# Patient Record
Sex: Female | Born: 1935 | Race: White | Hispanic: No | Smoking: Never smoker
Health system: Southern US, Community
[De-identification: ages and names within clinical notes are randomized; demographics above are authoritative.]

## PROBLEM LIST (undated history)

## (undated) DIAGNOSIS — F32A Depression, unspecified: Secondary | ICD-10-CM

## (undated) DIAGNOSIS — F329 Major depressive disorder, single episode, unspecified: Secondary | ICD-10-CM

## (undated) DIAGNOSIS — F419 Anxiety disorder, unspecified: Secondary | ICD-10-CM

## (undated) DIAGNOSIS — K219 Gastro-esophageal reflux disease without esophagitis: Secondary | ICD-10-CM

## (undated) DIAGNOSIS — E538 Deficiency of other specified B group vitamins: Secondary | ICD-10-CM

## (undated) DIAGNOSIS — C801 Malignant (primary) neoplasm, unspecified: Secondary | ICD-10-CM

## (undated) DIAGNOSIS — F29 Unspecified psychosis not due to a substance or known physiological condition: Secondary | ICD-10-CM

## (undated) DIAGNOSIS — M1990 Unspecified osteoarthritis, unspecified site: Secondary | ICD-10-CM

## (undated) DIAGNOSIS — M519 Unspecified thoracic, thoracolumbar and lumbosacral intervertebral disc disorder: Secondary | ICD-10-CM

## (undated) DIAGNOSIS — I10 Essential (primary) hypertension: Secondary | ICD-10-CM

## (undated) DIAGNOSIS — S62102A Fracture of unspecified carpal bone, left wrist, initial encounter for closed fracture: Secondary | ICD-10-CM

## (undated) DIAGNOSIS — K7581 Nonalcoholic steatohepatitis (NASH): Secondary | ICD-10-CM

## (undated) HISTORY — DX: Nonalcoholic steatohepatitis (NASH): K75.81

## (undated) HISTORY — PX: JOINT REPLACEMENT: SHX530

## (undated) HISTORY — PX: ELBOW SURGERY: SHX618

## (undated) HISTORY — DX: Gastro-esophageal reflux disease without esophagitis: K21.9

## (undated) HISTORY — DX: Depression, unspecified: F32.A

## (undated) HISTORY — DX: Unspecified thoracic, thoracolumbar and lumbosacral intervertebral disc disorder: M51.9

## (undated) HISTORY — DX: Major depressive disorder, single episode, unspecified: F32.9

## (undated) HISTORY — DX: Essential (primary) hypertension: I10

## (undated) HISTORY — DX: Deficiency of other specified B group vitamins: E53.8

## (undated) HISTORY — DX: Unspecified osteoarthritis, unspecified site: M19.90

## (undated) HISTORY — PX: OTHER SURGICAL HISTORY: SHX169

## (undated) HISTORY — DX: Anxiety disorder, unspecified: F41.9

## (undated) HISTORY — PX: BREAST SURGERY: SHX581

## (undated) HISTORY — PX: CHOLECYSTECTOMY: SHX55

## (undated) HISTORY — DX: Fracture of unspecified carpal bone, left wrist, initial encounter for closed fracture: S62.102A

## (undated) HISTORY — DX: Unspecified psychosis not due to a substance or known physiological condition: F29

## (undated) HISTORY — PX: MANDIBLE SURGERY: SHX707

## (undated) HISTORY — PX: ABDOMINAL HYSTERECTOMY: SHX81

## (undated) HISTORY — PX: CATARACT EXTRACTION W/ INTRAOCULAR LENS  IMPLANT, BILATERAL: SHX1307

## (undated) HISTORY — PX: HEMORRHOID SURGERY: SHX153

---

## 2005-04-24 ENCOUNTER — Ambulatory Visit: Payer: Self-pay | Admitting: Internal Medicine

## 2005-08-19 ENCOUNTER — Ambulatory Visit: Payer: Self-pay | Admitting: Urology

## 2005-11-05 ENCOUNTER — Ambulatory Visit: Payer: Self-pay | Admitting: Unknown Physician Specialty

## 2005-11-12 ENCOUNTER — Ambulatory Visit: Payer: Self-pay | Admitting: Unknown Physician Specialty

## 2005-12-28 ENCOUNTER — Ambulatory Visit: Payer: Self-pay | Admitting: Unknown Physician Specialty

## 2006-02-16 ENCOUNTER — Ambulatory Visit: Payer: Self-pay | Admitting: Internal Medicine

## 2006-02-23 ENCOUNTER — Ambulatory Visit: Payer: Self-pay | Admitting: Unknown Physician Specialty

## 2006-02-25 ENCOUNTER — Ambulatory Visit: Payer: Self-pay | Admitting: Internal Medicine

## 2006-04-29 ENCOUNTER — Ambulatory Visit: Payer: Self-pay | Admitting: Unknown Physician Specialty

## 2006-04-30 ENCOUNTER — Ambulatory Visit: Payer: Self-pay | Admitting: Unknown Physician Specialty

## 2006-04-30 ENCOUNTER — Other Ambulatory Visit: Payer: Self-pay

## 2006-05-07 ENCOUNTER — Ambulatory Visit: Payer: Self-pay | Admitting: Unknown Physician Specialty

## 2006-05-21 ENCOUNTER — Inpatient Hospital Stay: Payer: Self-pay | Admitting: Psychiatry

## 2006-05-21 ENCOUNTER — Other Ambulatory Visit: Payer: Self-pay

## 2006-07-08 ENCOUNTER — Ambulatory Visit: Payer: Self-pay | Admitting: Internal Medicine

## 2006-07-20 ENCOUNTER — Ambulatory Visit: Payer: Self-pay | Admitting: Internal Medicine

## 2006-08-08 ENCOUNTER — Ambulatory Visit: Payer: Self-pay | Admitting: Internal Medicine

## 2006-08-25 ENCOUNTER — Ambulatory Visit: Payer: Self-pay | Admitting: Internal Medicine

## 2006-09-07 ENCOUNTER — Ambulatory Visit: Payer: Self-pay | Admitting: Internal Medicine

## 2006-09-16 ENCOUNTER — Ambulatory Visit: Payer: Self-pay | Admitting: Unknown Physician Specialty

## 2006-12-24 ENCOUNTER — Emergency Department: Payer: Self-pay | Admitting: Emergency Medicine

## 2007-01-05 ENCOUNTER — Ambulatory Visit: Payer: Self-pay | Admitting: Unknown Physician Specialty

## 2007-01-08 ENCOUNTER — Ambulatory Visit: Payer: Self-pay | Admitting: Internal Medicine

## 2007-01-28 ENCOUNTER — Ambulatory Visit: Payer: Self-pay | Admitting: Internal Medicine

## 2007-02-07 ENCOUNTER — Ambulatory Visit: Payer: Self-pay | Admitting: Internal Medicine

## 2007-04-04 ENCOUNTER — Ambulatory Visit: Payer: Self-pay | Admitting: Unknown Physician Specialty

## 2007-04-04 ENCOUNTER — Other Ambulatory Visit: Payer: Self-pay

## 2007-04-07 ENCOUNTER — Inpatient Hospital Stay: Payer: Self-pay | Admitting: Unknown Physician Specialty

## 2007-05-17 ENCOUNTER — Ambulatory Visit: Payer: Self-pay | Admitting: Internal Medicine

## 2007-05-25 ENCOUNTER — Ambulatory Visit: Payer: Self-pay | Admitting: Internal Medicine

## 2007-11-08 ENCOUNTER — Ambulatory Visit: Payer: Self-pay | Admitting: Internal Medicine

## 2007-11-22 ENCOUNTER — Ambulatory Visit: Payer: Self-pay | Admitting: Otolaryngology

## 2007-11-28 ENCOUNTER — Ambulatory Visit: Payer: Self-pay | Admitting: Otolaryngology

## 2007-12-08 ENCOUNTER — Ambulatory Visit: Payer: Self-pay | Admitting: Internal Medicine

## 2008-01-08 ENCOUNTER — Ambulatory Visit: Payer: Self-pay | Admitting: Internal Medicine

## 2008-04-09 ENCOUNTER — Ambulatory Visit: Payer: Self-pay | Admitting: Internal Medicine

## 2008-04-10 ENCOUNTER — Ambulatory Visit: Payer: Self-pay | Admitting: Internal Medicine

## 2008-05-07 ENCOUNTER — Ambulatory Visit: Payer: Self-pay | Admitting: Internal Medicine

## 2008-05-17 ENCOUNTER — Ambulatory Visit: Payer: Self-pay | Admitting: Internal Medicine

## 2008-06-07 ENCOUNTER — Ambulatory Visit: Payer: Self-pay | Admitting: Internal Medicine

## 2008-06-12 ENCOUNTER — Ambulatory Visit: Payer: Self-pay | Admitting: Internal Medicine

## 2008-07-07 ENCOUNTER — Ambulatory Visit: Payer: Self-pay | Admitting: Internal Medicine

## 2008-08-07 ENCOUNTER — Ambulatory Visit: Payer: Self-pay | Admitting: Internal Medicine

## 2008-09-06 ENCOUNTER — Ambulatory Visit: Payer: Self-pay | Admitting: Internal Medicine

## 2008-09-24 ENCOUNTER — Ambulatory Visit: Payer: Self-pay | Admitting: Internal Medicine

## 2008-10-07 ENCOUNTER — Ambulatory Visit: Payer: Self-pay | Admitting: Internal Medicine

## 2008-11-07 ENCOUNTER — Ambulatory Visit: Payer: Self-pay | Admitting: Internal Medicine

## 2008-12-07 ENCOUNTER — Ambulatory Visit: Payer: Self-pay | Admitting: Internal Medicine

## 2009-02-06 ENCOUNTER — Ambulatory Visit: Payer: Self-pay | Admitting: Internal Medicine

## 2009-02-19 ENCOUNTER — Ambulatory Visit: Payer: Self-pay | Admitting: Internal Medicine

## 2009-03-09 ENCOUNTER — Ambulatory Visit: Payer: Self-pay | Admitting: Internal Medicine

## 2009-05-06 ENCOUNTER — Ambulatory Visit: Payer: Self-pay | Admitting: Ophthalmology

## 2009-05-14 ENCOUNTER — Ambulatory Visit: Payer: Self-pay | Admitting: Internal Medicine

## 2009-05-28 ENCOUNTER — Ambulatory Visit: Payer: Self-pay | Admitting: Internal Medicine

## 2009-06-07 ENCOUNTER — Ambulatory Visit: Payer: Self-pay | Admitting: Internal Medicine

## 2009-08-06 ENCOUNTER — Ambulatory Visit: Payer: Self-pay | Admitting: Internal Medicine

## 2009-08-07 ENCOUNTER — Ambulatory Visit: Payer: Self-pay | Admitting: Internal Medicine

## 2009-10-07 ENCOUNTER — Ambulatory Visit: Payer: Self-pay | Admitting: Internal Medicine

## 2009-10-29 ENCOUNTER — Ambulatory Visit: Payer: Self-pay | Admitting: Internal Medicine

## 2009-11-07 ENCOUNTER — Ambulatory Visit: Payer: Self-pay | Admitting: Internal Medicine

## 2009-11-15 ENCOUNTER — Ambulatory Visit: Payer: Self-pay | Admitting: Otolaryngology

## 2009-11-20 ENCOUNTER — Ambulatory Visit: Payer: Self-pay | Admitting: Otolaryngology

## 2009-12-07 ENCOUNTER — Ambulatory Visit: Payer: Self-pay | Admitting: Internal Medicine

## 2009-12-17 ENCOUNTER — Inpatient Hospital Stay: Payer: Self-pay | Admitting: Internal Medicine

## 2010-02-25 ENCOUNTER — Ambulatory Visit: Payer: Self-pay | Admitting: Internal Medicine

## 2010-03-09 ENCOUNTER — Ambulatory Visit: Payer: Self-pay | Admitting: Internal Medicine

## 2010-03-09 HISTORY — PX: FIXATION KYPHOPLASTY: SHX860

## 2010-03-13 ENCOUNTER — Inpatient Hospital Stay: Payer: Self-pay | Admitting: Internal Medicine

## 2010-03-20 LAB — PATHOLOGY REPORT

## 2010-03-21 ENCOUNTER — Encounter: Payer: Self-pay | Admitting: Internal Medicine

## 2010-03-25 DIAGNOSIS — M810 Age-related osteoporosis without current pathological fracture: Secondary | ICD-10-CM

## 2010-03-25 DIAGNOSIS — E119 Type 2 diabetes mellitus without complications: Secondary | ICD-10-CM

## 2010-03-25 DIAGNOSIS — I10 Essential (primary) hypertension: Secondary | ICD-10-CM

## 2010-03-25 DIAGNOSIS — R21 Rash and other nonspecific skin eruption: Secondary | ICD-10-CM

## 2010-04-10 NOTE — Letter (Signed)
Summary: [facility] Face [facility]  [facility] Face Sheet   Imported By: Beau Fanny 03/26/2010 10:06:38  _____________________________________________________________________  External Attachment:    Type:   Image     Comment:   External Document

## 2010-04-30 ENCOUNTER — Emergency Department: Payer: Self-pay | Admitting: Emergency Medicine

## 2010-05-02 DIAGNOSIS — E119 Type 2 diabetes mellitus without complications: Secondary | ICD-10-CM

## 2010-05-02 DIAGNOSIS — I10 Essential (primary) hypertension: Secondary | ICD-10-CM

## 2010-05-02 DIAGNOSIS — M810 Age-related osteoporosis without current pathological fracture: Secondary | ICD-10-CM

## 2010-05-07 ENCOUNTER — Ambulatory Visit: Payer: Self-pay | Admitting: Unknown Physician Specialty

## 2010-05-09 ENCOUNTER — Ambulatory Visit: Payer: Self-pay | Admitting: Unknown Physician Specialty

## 2010-05-19 DIAGNOSIS — F29 Unspecified psychosis not due to a substance or known physiological condition: Secondary | ICD-10-CM

## 2010-05-26 ENCOUNTER — Telehealth: Payer: Self-pay | Admitting: Internal Medicine

## 2010-06-03 DIAGNOSIS — L03119 Cellulitis of unspecified part of limb: Secondary | ICD-10-CM

## 2010-06-03 DIAGNOSIS — L02419 Cutaneous abscess of limb, unspecified: Secondary | ICD-10-CM

## 2010-06-05 NOTE — Progress Notes (Signed)
Summary: call a nurse   Phone Note Call from Patient   Summary of Call: Triage Record Num: [id number] Operator: Caswell Corwin Patient Name: Veronica Stafford Call Date & Time: 05/23/2010 6:33:45PM Patient Phone: [phone] PCP: Raynelle Highland Patient Gender: Female PCP Fax : [phone] Patient DOB: 08/28/1928 Practice Name: [facility] Reason for Call: Cammie Sickle LPN calling from [facility] @ [phone] that pt is psychotic, delusional, hallucinating and has had Haldol 0.25 mg and had Klonopin 0.5 mg at 1600. She thinks men are in the ceiling takig her pictures and they are going to torture her. Pt was moved to another room and now is eating dinner. 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 Triaged Phobia and inst needs to be seen in the E/R, but states pt will not go. Paged Dr. Loreen Freud and requested per above nurse to give Haldol 0.5 mg and Klonopin 0.5 mg at 2000. Recalled cell at 1704 adn message left. May have one or the other probably the Haldol 0.5 mg @ 2000 and then if still aggitated she has to go out. Recalled and order given. Protocol(s) Used: Phobias Recommended Outcome per Protocol: See ED Immediately Reason for Outcome: Hearing voices that no one else hears or seeing things that no one else sees Care Advice:  ~ Another adult should drive.  ~ Should not be alone, arrange for support (family member, friend, etc.). 05/23/2010 7:29:23PM Page 1 Initial call taken by: Melody Comas,  May 26, 2010 8:12 AM  Follow-up for Phone Call        Has had ongoing paranoia antipsychotics being titrated Follow-up by: Cindee Salt MD,  May 26, 2010 10:11 AM

## 2010-06-09 DIAGNOSIS — F200 Paranoid schizophrenia: Secondary | ICD-10-CM

## 2010-06-18 DIAGNOSIS — K7689 Other specified diseases of liver: Secondary | ICD-10-CM

## 2010-06-24 ENCOUNTER — Ambulatory Visit: Payer: Self-pay | Admitting: Internal Medicine

## 2010-07-07 DIAGNOSIS — D649 Anemia, unspecified: Secondary | ICD-10-CM

## 2010-07-07 DIAGNOSIS — I872 Venous insufficiency (chronic) (peripheral): Secondary | ICD-10-CM

## 2010-07-07 DIAGNOSIS — F29 Unspecified psychosis not due to a substance or known physiological condition: Secondary | ICD-10-CM

## 2010-07-07 DIAGNOSIS — M810 Age-related osteoporosis without current pathological fracture: Secondary | ICD-10-CM

## 2010-07-08 ENCOUNTER — Ambulatory Visit: Payer: Self-pay | Admitting: Internal Medicine

## 2010-07-12 ENCOUNTER — Encounter: Payer: Self-pay | Admitting: Internal Medicine

## 2010-07-31 ENCOUNTER — Encounter: Payer: Self-pay | Admitting: Internal Medicine

## 2010-07-31 ENCOUNTER — Non-Acute Institutional Stay: Payer: Medicare Other | Admitting: Internal Medicine

## 2010-07-31 VITALS — BP 120/58 | HR 70 | Temp 98.2°F | Resp 18 | Wt 203.0 lb

## 2010-07-31 DIAGNOSIS — D696 Thrombocytopenia, unspecified: Secondary | ICD-10-CM | POA: Insufficient documentation

## 2010-07-31 DIAGNOSIS — K7581 Nonalcoholic steatohepatitis (NASH): Secondary | ICD-10-CM | POA: Insufficient documentation

## 2010-07-31 DIAGNOSIS — K7689 Other specified diseases of liver: Secondary | ICD-10-CM

## 2010-07-31 DIAGNOSIS — K219 Gastro-esophageal reflux disease without esophagitis: Secondary | ICD-10-CM | POA: Insufficient documentation

## 2010-07-31 DIAGNOSIS — E119 Type 2 diabetes mellitus without complications: Secondary | ICD-10-CM

## 2010-07-31 DIAGNOSIS — I10 Essential (primary) hypertension: Secondary | ICD-10-CM | POA: Insufficient documentation

## 2010-07-31 DIAGNOSIS — D61818 Other pancytopenia: Secondary | ICD-10-CM

## 2010-07-31 DIAGNOSIS — E1149 Type 2 diabetes mellitus with other diabetic neurological complication: Secondary | ICD-10-CM | POA: Insufficient documentation

## 2010-07-31 DIAGNOSIS — F29 Unspecified psychosis not due to a substance or known physiological condition: Secondary | ICD-10-CM | POA: Insufficient documentation

## 2010-07-31 DIAGNOSIS — I872 Venous insufficiency (chronic) (peripheral): Secondary | ICD-10-CM

## 2010-07-31 DIAGNOSIS — E538 Deficiency of other specified B group vitamins: Secondary | ICD-10-CM | POA: Insufficient documentation

## 2010-07-31 DIAGNOSIS — M810 Age-related osteoporosis without current pathological fracture: Secondary | ICD-10-CM | POA: Insufficient documentation

## 2010-07-31 DIAGNOSIS — F419 Anxiety disorder, unspecified: Secondary | ICD-10-CM | POA: Insufficient documentation

## 2010-07-31 NOTE — Patient Instructions (Signed)
Will plan follow up in about 3 months 

## 2010-07-31 NOTE — Progress Notes (Signed)
Subjective:    Patient ID: Veronica Stafford, female    DOB: 06-26-1928, 77 y.o.   MRN: [id number]  HPI DOing well Has adjusted to assisted living "I love it here--I don't know why I didn't come here sooner"  Sugars daily 140-170 usually She is worried miralax has sugar in it Discussed that the risperdal could be increasing it  Recently saw Dr Sherrlyn Hock Hgb 8 Got iron infusion Does note some ongoing fatigue Some DOE---no sig change recently or at most slightly worse No chest pain  Carla notes no major psychosis Not really delusional since first moving in (felt there were things going on with the air conditioning ducts) Anxiety controlled  Chronic edema Still with burning and cramping Chronic redness  Current outpatient prescriptions:allopurinol (ZYLOPRIM) 300 MG tablet, Take 300 mg by mouth daily.  , Disp: , Rfl: ;  atenolol (TENORMIN) 25 MG tablet, Take 25 mg by mouth daily.  , Disp: , Rfl: ;  clonazePAM (KLONOPIN) 0.5 MG tablet, Take 0.5 mg by mouth 3 (three) times daily as needed.  , Disp: , Rfl: ;  cyanocobalamin (,VITAMIN B-12,) 1000 MCG/ML injection, Inject 1,000 mcg into the muscle every 30 (thirty) days.  , Disp: , Rfl:  ergocalciferol (VITAMIN D2) 50000 UNITS capsule, Take 50,000 Units by mouth every 30 (thirty) days.  , Disp: , Rfl: ;  FLUoxetine (PROZAC) 10 MG capsule, Take 30 mg by mouth daily.  , Disp: , Rfl: ;  furosemide (LASIX) 80 MG tablet, Take 80 mg by mouth daily.  , Disp: , Rfl: ;  gabapentin (NEURONTIN) 300 MG capsule, Take 300 mg by mouth 3 (three) times daily.  , Disp: , Rfl:  glipiZIDE (GLUCOTROL) 5 MG tablet, Take 5 mg by mouth daily.  , Disp: , Rfl: ;  Multiple Vitamins-Minerals (MULTIVITAMIN WITH MINERALS) tablet, Take 1 tablet by mouth daily.  , Disp: , Rfl: ;  polyethylene glycol (MIRALAX / GLYCOLAX) packet, Take 17 g by mouth daily.  , Disp: , Rfl: ;  ranitidine (ZANTAC) 150 MG tablet, Take 150 mg by mouth 2 (two) times daily as needed.  , Disp: , Rfl:    risperiDONE (RISPERDAL) 1 MG tablet, Take 2 mg by mouth at bedtime.  , Disp: , Rfl: ;  traMADol (ULTRAM) 50 MG tablet, Take 50-100 mg by mouth every 4 (four) hours as needed.  , Disp: , Rfl: ;  zolpidem (AMBIEN CR) 12.5 MG CR tablet, Take 12.5 mg by mouth at bedtime as needed.  , Disp: , Rfl:   Past Medical History  Diagnosis Date  . NASH (nonalcoholic steatohepatitis)   . Vitamin B12 deficiency   . Psychosis     paranoid hallucinations  . Pancytopenia     Dr Osborne Oman infusions  . Arthritis   . Anxiety   . Hypertension   . GERD (gastroesophageal reflux disease)   . Diabetes mellitus   . Osteoporosis     Past Surgical History  Procedure Date  . Joint replacement     TKR bilateral  . Elbow surgery     4 on each  . Mandible surgery     twice  . Abdominal hysterectomy   . Cholecystectomy   . Breast surgery     breast reduction  . Hemorrhoid surgery   . Right ear lesion removal   . Fixation kyphoplasty 1/12  . Cataract extraction w/ intraocular lens  implant, bilateral     Family History  Problem Relation Age of Onset  . Cancer Mother  colon  . Heart disease Father     History   Social History  . Marital Status: Widowed    Spouse Name: N/A    Number of Children: N/A  . Years of Education: N/A   Occupational History  . Nurse     retired   Social History Main Topics  . Smoking status: Never Smoker   . Smokeless tobacco: Never Used  . Alcohol Use: No  . Drug Use: Not on file  . Sexually Active: Not on file   Other Topics Concern  . Not on file   Social History Narrative   1 son nearby--he is to make medical decisions2 daughtersHas DNRWould not accept tube feeds   Review of Systems Appetite is fine Weight stable or up slightly of late Sleeps "wonderfully"     Objective:   Physical Exam  Constitutional: She appears well-developed and well-nourished. No distress.  Neck: Normal range of motion. Neck supple. No thyromegaly present.   Cardiovascular: Normal rate, regular rhythm and normal heart sounds.  Exam reveals no gallop.   No murmur heard. Pulmonary/Chest: Effort normal and breath sounds normal. No respiratory distress. She has no wheezes. She has no rales.  Abdominal: Soft. There is no tenderness.  Musculoskeletal: Normal range of motion. She exhibits no tenderness.       2+ edema without change Redness is stable --mild brawny changes as well  Lymphadenopathy:    She has no cervical adenopathy.  Skin: No rash noted.       No ulcers  Psychiatric: She has a normal mood and affect. Her behavior is normal. Judgment and thought content normal.          Assessment & Plan:

## 2010-08-08 ENCOUNTER — Ambulatory Visit: Payer: Self-pay | Admitting: Internal Medicine

## 2010-09-16 ENCOUNTER — Other Ambulatory Visit: Payer: Self-pay | Admitting: *Deleted

## 2010-09-16 MED ORDER — RISPERIDONE 1 MG PO TABS: 2.0000 mg | ORAL_TABLET | Freq: Every day | ORAL | Status: AC

## 2010-09-16 NOTE — Telephone Encounter (Signed)
Okay x 11 refills Not sure why they sent it here though

## 2010-09-16 NOTE — Telephone Encounter (Signed)
[facility]ed [facility] is on your desk, directions are different from what is in chart.

## 2010-09-16 NOTE — Telephone Encounter (Signed)
rx faxed to pharmacy manually  

## 2010-10-22 ENCOUNTER — Other Ambulatory Visit: Payer: Self-pay | Admitting: *Deleted

## 2010-10-22 MED ORDER — ZOLPIDEM TARTRATE ER 12.5 MG PO TBCR: 12.5000 mg | EXTENDED_RELEASE_TABLET | Freq: Every evening | ORAL | Status: DC | PRN

## 2010-10-22 NOTE — Telephone Encounter (Signed)
rx faxed to pharmacy manually  

## 2010-10-22 NOTE — Telephone Encounter (Signed)
Form on your desk  

## 2010-10-22 NOTE — Telephone Encounter (Signed)
No form but okay to refill #30 x 3 Remember I need her on my schedule tomorrow

## 2010-10-23 ENCOUNTER — Non-Acute Institutional Stay: Payer: Medicare Other | Admitting: Internal Medicine

## 2010-10-23 ENCOUNTER — Encounter: Payer: Self-pay | Admitting: Internal Medicine

## 2010-10-23 VITALS — BP 134/70 | HR 78 | Temp 98.0°F | Resp 16 | Wt 202.0 lb

## 2010-10-23 DIAGNOSIS — E119 Type 2 diabetes mellitus without complications: Secondary | ICD-10-CM

## 2010-10-23 DIAGNOSIS — M5126 Other intervertebral disc displacement, lumbar region: Secondary | ICD-10-CM

## 2010-10-23 DIAGNOSIS — F29 Unspecified psychosis not due to a substance or known physiological condition: Secondary | ICD-10-CM

## 2010-10-23 DIAGNOSIS — F3289 Other specified depressive episodes: Secondary | ICD-10-CM

## 2010-10-23 DIAGNOSIS — M5116 Intervertebral disc disorders with radiculopathy, lumbar region: Secondary | ICD-10-CM | POA: Insufficient documentation

## 2010-10-23 DIAGNOSIS — F329 Major depressive disorder, single episode, unspecified: Secondary | ICD-10-CM

## 2010-10-23 DIAGNOSIS — I10 Essential (primary) hypertension: Secondary | ICD-10-CM

## 2010-10-23 DIAGNOSIS — F339 Major depressive disorder, recurrent, unspecified: Secondary | ICD-10-CM | POA: Insufficient documentation

## 2010-10-23 NOTE — Patient Instructions (Signed)
Will plan follow up in about 3 months Will adjust pain meds as needed

## 2010-10-23 NOTE — Progress Notes (Signed)
Subjective:    Patient ID: Veronica Stafford, female    DOB: [birth year], 77 y.o.   MRN: [id number]  HPI Having some bad left hip pain for about 3 weeks Hard to sit---has to lie down Tramadol doesn't seem to be helping Pain does go down entire thigh to knee Does okay while walking Not sure if it is from her back or the hip Pain is burning sensation  Just released from podiatrist who has followed the right heel ulcer This is basically healed  Still sees psychiatrist "going okay... I have my low points but more high than low" Fluoxetine was recently increased---still adjusting to move here and kids managing your affairs No recent hallucinations Staff note, and she confirms that she has been more depressed. Some anhedonia but she notes that her pain is now the limiting factor  Sugars have run 159-182 fasting No hypoglycemia  Current Outpatient Prescriptions on File Prior to Visit  Medication Sig Dispense Refill  . allopurinol (ZYLOPRIM) 300 MG tablet Take 300 mg by mouth daily.        Marland Kitchen atenolol (TENORMIN) 25 MG tablet Take 25 mg by mouth daily.        . clonazePAM (KLONOPIN) 0.5 MG tablet Take 0.5 mg by mouth 3 (three) times daily as needed.        . cyanocobalamin (,VITAMIN B-12,) 1000 MCG/ML injection Inject 1,000 mcg into the muscle every 30 (thirty) days.        . ergocalciferol (VITAMIN D2) 50000 UNITS capsule Take 50,000 Units by mouth every 30 (thirty) days.        . furosemide (LASIX) 80 MG tablet Take 80 mg by mouth daily.        Marland Kitchen gabapentin (NEURONTIN) 300 MG capsule Take 300 mg by mouth 3 (three) times daily.        Marland Kitchen glipiZIDE (GLUCOTROL) 5 MG tablet Take 5 mg by mouth daily.        . Multiple Vitamins-Minerals (MULTIVITAMIN WITH MINERALS) tablet Take 1 tablet by mouth daily.        . polyethylene glycol (MIRALAX / GLYCOLAX) packet Take 17 g by mouth daily.        . ranitidine (ZANTAC) 150 MG tablet Take 150 mg by mouth 2 (two) times daily as needed.        . risperiDONE  (RISPERDAL) 1 MG tablet Take 2 tablets (2 mg total) by mouth at bedtime.  30 tablet  11  . traMADol (ULTRAM) 50 MG tablet Take 50-100 mg by mouth every 4 (four) hours as needed.        . zolpidem (AMBIEN CR) 12.5 MG CR tablet Take 1 tablet (12.5 mg total) by mouth at bedtime as needed.  30 tablet  3    Allergies  Allergen Reactions  . Codeine   . Demerol   . Erythromycin   . Fentanyl   . Flagyl (Metronidazole Hcl)   . Morphine And Related     Past Medical History  Diagnosis Date  . NASH (nonalcoholic steatohepatitis)   . Vitamin B12 deficiency   . Psychosis     paranoid hallucinations  . Pancytopenia     Dr Osborne Oman infusions  . Arthritis   . Anxiety   . Hypertension   . GERD (gastroesophageal reflux disease)   . Diabetes mellitus   . Osteoporosis     Past Surgical History  Procedure Date  . Joint replacement     TKR bilateral  . Elbow surgery  4 on each  . Mandible surgery     twice  . Abdominal hysterectomy   . Cholecystectomy   . Breast surgery     breast reduction  . Hemorrhoid surgery   . Right ear lesion removal   . Fixation kyphoplasty 1/12  . Cataract extraction w/ intraocular lens  implant, bilateral     Family History  Problem Relation Age of Onset  . Cancer Mother     colon  . Heart disease Father     History   Social History  . Marital Status: Widowed    Spouse Name: N/A    Number of Children: N/A  . Years of Education: N/A   Occupational History  . Nurse     retired   Social History Main Topics  . Smoking status: Never Smoker   . Smokeless tobacco: Never Used  . Alcohol Use: No  . Drug Use: Not on file  . Sexually Active: Not on file   Other Topics Concern  . Not on file   Social History Narrative   1 son nearby--he is to make medical decisions2 daughtersHas DNRWould not accept tube feeds   Review of Systems Appetite has been off while in pain Sleeps okay--the pain is better when in bed     Objective:    Physical Exam  Constitutional: She appears well-developed and well-nourished.       Uncomfortable with pain  Neck: Normal range of motion. Neck supple. No thyromegaly present.  Cardiovascular: Normal rate, regular rhythm and normal heart sounds.  Exam reveals no gallop.   No murmur heard. Pulmonary/Chest: Effort normal and breath sounds normal. No respiratory distress. She has no wheezes. She has no rales.  Abdominal: Soft. There is no tenderness.  Musculoskeletal:       Moderate lumbar spine and paraspinal tenderness SLR mildly painful on left but not right Fairly good rotation in left hip---some pain with full internal rotation  Lymphadenopathy:    She has no cervical adenopathy.  Neurological:       Mild weakness in left leg that seems to be pain related  Skin:       Left medial heel ucler is granulated Several suspicious calf lesions-- is seeing Dr Laurena Bering  Psychiatric:       Appears depressed Normal speech  No hallucinations or delusions          Assessment & Plan:

## 2010-10-23 NOTE — Assessment & Plan Note (Signed)
Acceptable control for this setting  No changes

## 2010-10-23 NOTE — Assessment & Plan Note (Signed)
Clearly worse but i think this is due to pain Fluoxetine was increased Will address pain issues

## 2010-10-23 NOTE — Assessment & Plan Note (Addendum)
Has had 14 back surgeries Now with severe pain (8/10) in back, left hip and leg Doesn't seem related to hip arthritis No serious focal weakness Tramadol gives some relief but inconsistent with use despite severe pain  P: will start fentanyl patch   Continue prn tramadol   PT eval   NOTE---allergic to fentanyl                 Will try lidoderm patch on lower back and increase tramadol

## 2010-10-23 NOTE — Assessment & Plan Note (Signed)
BP Readings from Last 3 Encounters:  10/23/10 134/70  07/31/10 120/58   Good control No changes needed

## 2010-10-23 NOTE — Assessment & Plan Note (Signed)
Has been controlled on the risperdal Wean is inappropriate

## 2010-10-27 ENCOUNTER — Other Ambulatory Visit: Payer: Self-pay | Admitting: *Deleted

## 2010-10-27 NOTE — Telephone Encounter (Signed)
Faxed form from [facility] is on your desk.

## 2010-10-28 MED ORDER — CLONAZEPAM 0.5 MG PO TABS: 0.5000 mg | ORAL_TABLET | Freq: Three times a day (TID) | ORAL | Status: DC | PRN

## 2010-10-28 NOTE — Telephone Encounter (Signed)
rx faxed to pharmacy manually  

## 2010-10-28 NOTE — Telephone Encounter (Signed)
Okay #30 x 5 

## 2010-11-03 ENCOUNTER — Encounter: Payer: Self-pay | Admitting: Internal Medicine

## 2010-11-08 DIAGNOSIS — S62102A Fracture of unspecified carpal bone, left wrist, initial encounter for closed fracture: Secondary | ICD-10-CM

## 2010-11-08 HISTORY — DX: Fracture of unspecified carpal bone, left wrist, initial encounter for closed fracture: S62.102A

## 2010-11-12 ENCOUNTER — Other Ambulatory Visit: Payer: Self-pay | Admitting: *Deleted

## 2010-11-12 MED ORDER — FLUOXETINE HCL 40 MG PO CAPS: 40.0000 mg | ORAL_CAPSULE | Freq: Every day | ORAL | Status: AC

## 2010-11-12 NOTE — Telephone Encounter (Signed)
Okay to renew for 1 year

## 2010-11-18 ENCOUNTER — Ambulatory Visit: Payer: Self-pay | Admitting: Internal Medicine

## 2010-11-27 ENCOUNTER — Emergency Department: Payer: Self-pay | Admitting: Emergency Medicine

## 2010-12-02 DIAGNOSIS — M5126 Other intervertebral disc displacement, lumbar region: Secondary | ICD-10-CM

## 2010-12-02 DIAGNOSIS — F29 Unspecified psychosis not due to a substance or known physiological condition: Secondary | ICD-10-CM

## 2010-12-02 DIAGNOSIS — S5290XD Unspecified fracture of unspecified forearm, subsequent encounter for closed fracture with routine healing: Secondary | ICD-10-CM

## 2010-12-02 DIAGNOSIS — F3289 Other specified depressive episodes: Secondary | ICD-10-CM

## 2010-12-02 DIAGNOSIS — F329 Major depressive disorder, single episode, unspecified: Secondary | ICD-10-CM

## 2010-12-08 ENCOUNTER — Ambulatory Visit: Payer: Self-pay | Admitting: Internal Medicine

## 2010-12-09 DIAGNOSIS — M5126 Other intervertebral disc displacement, lumbar region: Secondary | ICD-10-CM

## 2010-12-23 DIAGNOSIS — F29 Unspecified psychosis not due to a substance or known physiological condition: Secondary | ICD-10-CM

## 2010-12-23 DIAGNOSIS — E119 Type 2 diabetes mellitus without complications: Secondary | ICD-10-CM

## 2010-12-23 DIAGNOSIS — S5290XD Unspecified fracture of unspecified forearm, subsequent encounter for closed fracture with routine healing: Secondary | ICD-10-CM

## 2010-12-23 DIAGNOSIS — F329 Major depressive disorder, single episode, unspecified: Secondary | ICD-10-CM

## 2011-02-05 ENCOUNTER — Encounter: Payer: Self-pay | Admitting: Internal Medicine

## 2011-02-05 ENCOUNTER — Non-Acute Institutional Stay: Payer: Medicare Other | Admitting: Internal Medicine

## 2011-02-05 VITALS — BP 100/58 | HR 75 | Temp 98.0°F | Resp 18 | Wt 199.0 lb

## 2011-02-05 DIAGNOSIS — F29 Unspecified psychosis not due to a substance or known physiological condition: Secondary | ICD-10-CM

## 2011-02-05 DIAGNOSIS — E119 Type 2 diabetes mellitus without complications: Secondary | ICD-10-CM

## 2011-02-05 DIAGNOSIS — I10 Essential (primary) hypertension: Secondary | ICD-10-CM

## 2011-02-05 DIAGNOSIS — M5116 Intervertebral disc disorders with radiculopathy, lumbar region: Secondary | ICD-10-CM

## 2011-02-05 DIAGNOSIS — F329 Major depressive disorder, single episode, unspecified: Secondary | ICD-10-CM

## 2011-02-05 DIAGNOSIS — M5126 Other intervertebral disc displacement, lumbar region: Secondary | ICD-10-CM

## 2011-02-05 NOTE — Patient Instructions (Signed)
Will plan follow up in 3 months or so

## 2011-02-05 NOTE — Assessment & Plan Note (Signed)
Good control No changes needed No hypoglycemia

## 2011-02-05 NOTE — Assessment & Plan Note (Signed)
Reasonable pain control with tramadol and lidoderm patch

## 2011-02-05 NOTE — Progress Notes (Signed)
Subjective:    Patient ID: Veronica Stafford, female    DOB: 05-11-1928, 77 y.o.   MRN: [id number]  HPI Reviewed status with Albin Felling, RN. No new concerns Doing fairly well Now in splint for left wrist---able to take it off at times Now able to use it more Gets "tired" when out of the brace---is able to use this prn   Recent basal cell and SCC removed Has follow up in February  Notes that her "legs are giving out on me" Not really a pain issue Her knee implants are loose Trouble walking--discussed pool exercises  Ongoing back pain Reasonable control with tramadol and lidoderm  Depression seems to be controlled Is not going to go back to psychiatrist in [place] for now No hallucinations or paranoia since on the risperdal Frustrated with limitations though  No trouble with diabetes Sugars 115,121,122,150 No hypoglycemia  Current Outpatient Prescriptions on File Prior to Visit  Medication Sig Dispense Refill  . allopurinol (ZYLOPRIM) 300 MG tablet Take 300 mg by mouth daily.        Marland Kitchen atenolol (TENORMIN) 25 MG tablet Take 25 mg by mouth daily.        . clonazePAM (KLONOPIN) 0.5 MG tablet Take 1 tablet (0.5 mg total) by mouth 3 (three) times daily as needed.  30 tablet  5  . cyanocobalamin (,VITAMIN B-12,) 1000 MCG/ML injection Inject 1,000 mcg into the muscle every 30 (thirty) days.        . ergocalciferol (VITAMIN D2) 50000 UNITS capsule Take 50,000 Units by mouth every 30 (thirty) days.        Marland Kitchen FLUoxetine (PROZAC) 40 MG capsule Take 1 capsule (40 mg total) by mouth daily.  30 capsule  11  . furosemide (LASIX) 80 MG tablet Take 80 mg by mouth daily.        Marland Kitchen gabapentin (NEURONTIN) 300 MG capsule Take 300 mg by mouth 3 (three) times daily.        Marland Kitchen glipiZIDE (GLUCOTROL) 5 MG tablet Take 5 mg by mouth daily.        Marland Kitchen lidocaine (LIDODERM) 5 % Place 1 patch onto the skin daily. Remove & Discard patch within 12 hours or as directed by MD       . Multiple Vitamins-Minerals (MULTIVITAMIN  WITH MINERALS) tablet Take 1 tablet by mouth daily.        . polyethylene glycol (MIRALAX / GLYCOLAX) packet Take 17 g by mouth daily.        . ranitidine (ZANTAC) 150 MG tablet Take 150 mg by mouth 2 (two) times daily as needed.        . risperiDONE (RISPERDAL) 1 MG tablet Take 2 tablets (2 mg total) by mouth at bedtime.  30 tablet  11  . traMADol (ULTRAM) 50 MG tablet Take 100 mg by mouth 2 (two) times daily.       Marland Kitchen zolpidem (AMBIEN CR) 12.5 MG CR tablet Take 1 tablet (12.5 mg total) by mouth at bedtime as needed.  30 tablet  3    Allergies  Allergen Reactions  . Codeine   . Demerol   . Erythromycin   . Fentanyl   . Flagyl (Metronidazole Hcl)   . Morphine And Related     Past Medical History  Diagnosis Date  . NASH (nonalcoholic steatohepatitis)   . Vitamin B12 deficiency   . Psychosis     paranoid hallucinations  . Pancytopenia     Dr Osborne Oman infusions  . Arthritis   .  Anxiety   . Hypertension   . GERD (gastroesophageal reflux disease)   . Diabetes mellitus   . Osteoporosis   . Depression   . Lumbar disc disease   . Wrist fracture, left 9/12    Past Surgical History  Procedure Date  . Joint replacement     TKR bilateral  . Elbow surgery     4 on each  . Mandible surgery     twice  . Abdominal hysterectomy   . Cholecystectomy   . Breast surgery     breast reduction  . Hemorrhoid surgery   . Right ear lesion removal   . Fixation kyphoplasty 1/12  . Cataract extraction w/ intraocular lens  implant, bilateral     Family History  Problem Relation Age of Onset  . Cancer Mother     colon  . Heart disease Father     History   Social History  . Marital Status: Widowed    Spouse Name: N/A    Number of Children: N/A  . Years of Education: N/A   Occupational History  . Nurse     retired   Social History Main Topics  . Smoking status: Never Smoker   . Smokeless tobacco: Never Used  . Alcohol Use: No  . Drug Use: Not on file  . Sexually  Active: Not on file   Other Topics Concern  . Not on file   Social History Narrative   1 son nearby--he is to make medical decisions2 daughtersHas DNRWould not accept tube feeds   Review of Systems Appetite is fair Weight is stable Sleeps great Bowels are variable---did need enema not long ago. On miralax daily    Objective:   Physical Exam  Constitutional: She appears well-developed and well-nourished. No distress.  Neck: Normal range of motion. Neck supple.  Cardiovascular: Normal rate, regular rhythm and normal heart sounds.  Exam reveals no gallop.   No murmur heard. Pulmonary/Chest: Effort normal and breath sounds normal. No respiratory distress. She has no wheezes. She has no rales.  Abdominal: Soft. There is no tenderness.  Musculoskeletal: She exhibits no edema and no tenderness.  Lymphadenopathy:    She has no cervical adenopathy.  Psychiatric: Her behavior is normal.       Dysthymic Vague discontent but not overtly depressed Not anhedonic (art and other activities)          Assessment & Plan:

## 2011-02-05 NOTE — Assessment & Plan Note (Signed)
BP Readings from Last 3 Encounters:  02/05/11 100/58  10/23/10 134/70  07/31/10 120/58   occ low but no dizziness Lasix for edema No changes for now

## 2011-02-05 NOTE — Assessment & Plan Note (Signed)
Mildly worse in wake of recent SNF stay for another wrist fracture Laments her decline in general Discussed trying pool walking to be more active (knees limit her walking)

## 2011-02-05 NOTE — Assessment & Plan Note (Signed)
Associated with the depression risperdal has controlled---wean is contraindicated This is probably helping as augmentation for depression Rx as well

## 2011-03-09 DIAGNOSIS — K5289 Other specified noninfective gastroenteritis and colitis: Secondary | ICD-10-CM

## 2011-04-01 ENCOUNTER — Other Ambulatory Visit: Payer: Self-pay | Admitting: *Deleted

## 2011-04-01 NOTE — Telephone Encounter (Signed)
Form on your desk to be signed, must fax back electronically

## 2011-04-02 MED ORDER — CLONAZEPAM 0.5 MG PO TABS: 0.5000 mg | ORAL_TABLET | Freq: Three times a day (TID) | ORAL | Status: DC | PRN

## 2011-04-02 NOTE — Telephone Encounter (Signed)
Okay #90 x 3 No form Should be able to call [facility] but I can sign form if I see it

## 2011-04-02 NOTE — Telephone Encounter (Signed)
rx called into pharmacy

## 2011-04-29 ENCOUNTER — Ambulatory Visit: Payer: Self-pay | Admitting: Unknown Physician Specialty

## 2011-06-03 LAB — HEMOGLOBIN A1C: Hgb A1c MFr Bld: 5.9 % (ref 4.0–6.0)

## 2011-06-04 ENCOUNTER — Encounter: Payer: Self-pay | Admitting: Internal Medicine

## 2011-06-04 ENCOUNTER — Non-Acute Institutional Stay: Payer: Medicare Other | Admitting: Internal Medicine

## 2011-06-04 VITALS — BP 112/60 | HR 72 | Temp 98.1°F | Resp 22 | Wt 196.0 lb

## 2011-06-04 DIAGNOSIS — F29 Unspecified psychosis not due to a substance or known physiological condition: Secondary | ICD-10-CM

## 2011-06-04 DIAGNOSIS — F419 Anxiety disorder, unspecified: Secondary | ICD-10-CM

## 2011-06-04 DIAGNOSIS — E119 Type 2 diabetes mellitus without complications: Secondary | ICD-10-CM

## 2011-06-04 DIAGNOSIS — F329 Major depressive disorder, single episode, unspecified: Secondary | ICD-10-CM

## 2011-06-04 DIAGNOSIS — I10 Essential (primary) hypertension: Secondary | ICD-10-CM

## 2011-06-04 DIAGNOSIS — M5116 Intervertebral disc disorders with radiculopathy, lumbar region: Secondary | ICD-10-CM

## 2011-06-04 NOTE — Assessment & Plan Note (Signed)
Has been controlled on the risperdal Had severe paranoid hallucinations (not dementia related) and needs to continue the med

## 2011-06-04 NOTE — Assessment & Plan Note (Signed)
has been well controlled A1c just done and only 5.9%

## 2011-06-04 NOTE — Progress Notes (Signed)
Subjective:    Patient ID: Veronica Stafford, female    DOB: May 01, 1928, 78 y.o.   MRN: [id number]  HPI Doing okay Reviewed status with Albin Felling RN  Had increased back pain Tramadol increased and this has helped Still with some knee pain and stiffness--esp when just getting up  Frustrated by numerous things Hasn't seen son in a while----needs new chair, didn't replace lights with brighter, etc Not really depressed per se though No hallucinations or delusions  No trouble with heartburn Remains on the ranitidine Does notice her taste is off---so she has trouble swallowing food at times (doesn't seem to be true dysphagia)  Uses the clonazepam This does help when she is stressed  No chest pain No SOB  Sugars have been good No hypoglycemic reactions  Current Outpatient Prescriptions on File Prior to Visit  Medication Sig Dispense Refill  . allopurinol (ZYLOPRIM) 300 MG tablet Take 300 mg by mouth daily.        Marland Kitchen atenolol (TENORMIN) 25 MG tablet Take 25 mg by mouth daily.        . clonazePAM (KLONOPIN) 0.5 MG tablet Take 1 tablet (0.5 mg total) by mouth 3 (three) times daily as needed.  90 tablet  3  . cyanocobalamin (,VITAMIN B-12,) 1000 MCG/ML injection Inject 1,000 mcg into the muscle every 30 (thirty) days.        . ergocalciferol (VITAMIN D2) 50000 UNITS capsule Take 50,000 Units by mouth every 30 (thirty) days.        Marland Kitchen FLUoxetine (PROZAC) 40 MG capsule Take 1 capsule (40 mg total) by mouth daily.  30 capsule  11  . furosemide (LASIX) 80 MG tablet Take 80 mg by mouth daily.        Marland Kitchen gabapentin (NEURONTIN) 300 MG capsule Take 300 mg by mouth 3 (three) times daily.        Marland Kitchen glipiZIDE (GLUCOTROL) 5 MG tablet Take 5 mg by mouth daily.        Marland Kitchen lidocaine (LIDODERM) 5 % Place 1 patch onto the skin daily. Remove & Discard patch within 12 hours or as directed by MD       . Multiple Vitamins-Minerals (MULTIVITAMIN WITH MINERALS) tablet Take 1 tablet by mouth daily.        . polyethylene  glycol (MIRALAX / GLYCOLAX) packet Take 17 g by mouth daily.        . ranitidine (ZANTAC) 150 MG tablet Take 150 mg by mouth 2 (two) times daily as needed.        . risperiDONE (RISPERDAL) 1 MG tablet Take 2 tablets (2 mg total) by mouth at bedtime.  30 tablet  11  . traMADol (ULTRAM) 50 MG tablet Take 100 mg by mouth 3 (three) times daily.       Marland Kitchen zolpidem (AMBIEN CR) 12.5 MG CR tablet Take 1 tablet (12.5 mg total) by mouth at bedtime as needed.  30 tablet  3    Allergies  Allergen Reactions  . Codeine   . Demerol   . Erythromycin   . Fentanyl   . Flagyl (Metronidazole Hcl)   . Morphine And Related     Past Medical History  Diagnosis Date  . NASH (nonalcoholic steatohepatitis)   . Vitamin B12 deficiency   . Psychosis     paranoid hallucinations  . Pancytopenia     Dr Osborne Oman infusions  . Arthritis   . Anxiety   . Hypertension   . GERD (gastroesophageal reflux disease)   .  Diabetes mellitus   . Osteoporosis   . Depression   . Lumbar disc disease   . Wrist fracture, left 9/12    Past Surgical History  Procedure Date  . Joint replacement     TKR bilateral  . Elbow surgery     4 on each  . Mandible surgery     twice  . Abdominal hysterectomy   . Cholecystectomy   . Breast surgery     breast reduction  . Hemorrhoid surgery   . Right ear lesion removal   . Fixation kyphoplasty 1/12  . Cataract extraction w/ intraocular lens  implant, bilateral     Family History  Problem Relation Age of Onset  . Cancer Mother     colon  . Heart disease Father     History   Social History  . Marital Status: Widowed    Spouse Name: N/A    Number of Children: N/A  . Years of Education: N/A   Occupational History  . Nurse     retired   Social History Main Topics  . Smoking status: Never Smoker   . Smokeless tobacco: Never Used  . Alcohol Use: No  . Drug Use: Not on file  . Sexually Active: Not on file   Other Topics Concern  . Not on file   Social History  Narrative   1 son nearby--he is to make medical decisions2 daughtersHas DNRWould not accept tube feeds   Review of Systems Sleeps well---uses the clonazepam and ambien Weight is down a few pounds--she is trying to lose weight Both thumb nails are again growing wild---asks that I trim them Has kept up with dermatologist---has needed some laser Rx to lesions on legs     Objective:   Physical Exam  Constitutional: She appears well-developed and well-nourished. No distress.  Neck: Normal range of motion. Neck supple.  Cardiovascular: Normal rate, regular rhythm, normal heart sounds and intact distal pulses.  Exam reveals no gallop.   No murmur heard. Pulmonary/Chest: Effort normal and breath sounds normal. No respiratory distress. She has no wheezes. She has no rales.  Abdominal: Soft. There is no tenderness.  Musculoskeletal: She exhibits no edema.       Stasis changes in calves  Lymphadenopathy:    She has no cervical adenopathy.  Skin:       Thick mycotic thumbnails bilaterally Trimmed with nail cutter  Psychiatric:       Normal interaction but seems slightly down          Assessment & Plan:

## 2011-06-04 NOTE — Assessment & Plan Note (Signed)
BP Readings from Last 3 Encounters:  06/04/11 112/60  02/05/11 100/58  10/23/10 134/70   Good control No changes needed

## 2011-06-04 NOTE — Assessment & Plan Note (Signed)
Sees hematologist Often gets iron infusion

## 2011-06-04 NOTE — Assessment & Plan Note (Signed)
Pain had worsened Has responded to increase in tramadol

## 2011-06-04 NOTE — Assessment & Plan Note (Signed)
Has had a hard winter Multiple frustrations No major exacerbation though Continue the fluoxetine indefinitely

## 2011-06-04 NOTE — Assessment & Plan Note (Signed)
Controlled with clonazepam Uses the zolpidem for sleep

## 2011-06-12 ENCOUNTER — Other Ambulatory Visit: Payer: Self-pay | Admitting: *Deleted

## 2011-06-12 MED ORDER — ZOLPIDEM TARTRATE ER 12.5 MG PO TBCR: 12.5000 mg | EXTENDED_RELEASE_TABLET | Freq: Every evening | ORAL | Status: DC | PRN

## 2011-06-12 NOTE — Telephone Encounter (Signed)
rx called into pharmacy

## 2011-06-12 NOTE — Telephone Encounter (Signed)
Okay #30 x 1 

## 2011-07-23 ENCOUNTER — Encounter: Payer: Self-pay | Admitting: Internal Medicine

## 2011-07-23 ENCOUNTER — Non-Acute Institutional Stay: Payer: Medicare Other | Admitting: Internal Medicine

## 2011-07-23 DIAGNOSIS — L02619 Cutaneous abscess of unspecified foot: Secondary | ICD-10-CM

## 2011-07-23 DIAGNOSIS — L03032 Cellulitis of left toe: Secondary | ICD-10-CM | POA: Insufficient documentation

## 2011-07-23 DIAGNOSIS — L03039 Cellulitis of unspecified toe: Secondary | ICD-10-CM

## 2011-07-23 NOTE — Assessment & Plan Note (Signed)
Probably secondary to open area caused by old shoes Not overly painful but sig inflammation noted Will treat with keflex for 7 days

## 2011-07-23 NOTE — Progress Notes (Signed)
Subjective:    Patient ID: Veronica Stafford, female    DOB: 1928/10/31, 78 y.o.   MRN: [id number]  HPI Left 2nd toe swelling for several days Redness also Not overly  Painful Started after she wore old pair of shoes Opened up area on top of toe Has worsened since  Current Outpatient Prescriptions on File Prior to Visit  Medication Sig Dispense Refill  . allopurinol (ZYLOPRIM) 300 MG tablet Take 300 mg by mouth daily.        Marland Kitchen atenolol (TENORMIN) 25 MG tablet Take 25 mg by mouth daily.        . clonazePAM (KLONOPIN) 0.5 MG tablet Take 1 tablet (0.5 mg total) by mouth 3 (three) times daily as needed.  90 tablet  3  . cyanocobalamin (,VITAMIN B-12,) 1000 MCG/ML injection Inject 1,000 mcg into the muscle every 30 (thirty) days.        . ergocalciferol (VITAMIN D2) 50000 UNITS capsule Take 50,000 Units by mouth every 30 (thirty) days.        Marland Kitchen FLUoxetine (PROZAC) 40 MG capsule Take 1 capsule (40 mg total) by mouth daily.  30 capsule  11  . furosemide (LASIX) 80 MG tablet Take 80 mg by mouth daily.        Marland Kitchen gabapentin (NEURONTIN) 300 MG capsule Take 300 mg by mouth 3 (three) times daily.        Marland Kitchen glipiZIDE (GLUCOTROL) 5 MG tablet Take 5 mg by mouth daily.        Marland Kitchen lidocaine (LIDODERM) 5 % Place 1 patch onto the skin daily. Remove & Discard patch within 12 hours or as directed by MD       . meclizine (ANTIVERT) 12.5 MG tablet Take 12.5 mg by mouth 3 (three) times daily as needed.      . Multiple Vitamins-Minerals (MULTIVITAMIN WITH MINERALS) tablet Take 1 tablet by mouth daily.        . polyethylene glycol (MIRALAX / GLYCOLAX) packet Take 17 g by mouth daily.        . ranitidine (ZANTAC) 150 MG tablet Take 150 mg by mouth 2 (two) times daily as needed.        . risperiDONE (RISPERDAL) 1 MG tablet Take 2 tablets (2 mg total) by mouth at bedtime.  30 tablet  11  . traMADol (ULTRAM) 50 MG tablet Take 100 mg by mouth 3 (three) times daily.       Marland Kitchen zolpidem (AMBIEN CR) 12.5 MG CR tablet Take 1 tablet  (12.5 mg total) by mouth at bedtime as needed.  30 tablet  1    Allergies  Allergen Reactions  . Codeine   . Demerol   . Erythromycin   . Fentanyl   . Flagyl (Metronidazole Hcl)   . Morphine And Related     Past Medical History  Diagnosis Date  . NASH (nonalcoholic steatohepatitis)   . Vitamin B12 deficiency   . Psychosis     paranoid hallucinations  . Pancytopenia     Dr Osborne Oman infusions  . Arthritis   . Anxiety   . Hypertension   . GERD (gastroesophageal reflux disease)   . Diabetes mellitus   . Osteoporosis   . Depression   . Lumbar disc disease   . Wrist fracture, left 9/12    Past Surgical History  Procedure Date  . Joint replacement     TKR bilateral  . Elbow surgery     4 on each  . Mandible surgery  twice  . Abdominal hysterectomy   . Cholecystectomy   . Breast surgery     breast reduction  . Hemorrhoid surgery   . Right ear lesion removal   . Fixation kyphoplasty 1/12  . Cataract extraction w/ intraocular lens  implant, bilateral     Family History  Problem Relation Age of Onset  . Cancer Mother     colon  . Heart disease Father     History   Social History  . Marital Status: Widowed    Spouse Name: N/A    Number of Children: N/A  . Years of Education: N/A   Occupational History  . Nurse     retired   Social History Main Topics  . Smoking status: Never Smoker   . Smokeless tobacco: Never Used  . Alcohol Use: No  . Drug Use: Not on file  . Sexually Active: Not on file   Other Topics Concern  . Not on file   Social History Narrative   1 son nearby--he is to make medical decisions2 daughtersHas DNRWould not accept tube feeds   Review of Systems No fever No nausea or vomiting     Objective:   Physical Exam  Constitutional: She appears well-developed and well-nourished. No distress.  Skin:       Open skin area with greenish eschar on top of slight hammertoe --left 2nd Deep red discoloration across top of digit  without sig warmth Some tenderness more proximally          Assessment & Plan:

## 2011-09-03 ENCOUNTER — Non-Acute Institutional Stay: Payer: Medicare Other | Admitting: Internal Medicine

## 2011-09-03 ENCOUNTER — Encounter: Payer: Self-pay | Admitting: Internal Medicine

## 2011-09-03 VITALS — BP 122/62 | HR 62 | Temp 98.3°F | Resp 20 | Wt 201.0 lb

## 2011-09-03 DIAGNOSIS — M5126 Other intervertebral disc displacement, lumbar region: Secondary | ICD-10-CM

## 2011-09-03 DIAGNOSIS — F29 Unspecified psychosis not due to a substance or known physiological condition: Secondary | ICD-10-CM

## 2011-09-03 DIAGNOSIS — E119 Type 2 diabetes mellitus without complications: Secondary | ICD-10-CM

## 2011-09-03 DIAGNOSIS — F329 Major depressive disorder, single episode, unspecified: Secondary | ICD-10-CM

## 2011-09-03 DIAGNOSIS — M5116 Intervertebral disc disorders with radiculopathy, lumbar region: Secondary | ICD-10-CM

## 2011-09-03 DIAGNOSIS — I10 Essential (primary) hypertension: Secondary | ICD-10-CM

## 2011-09-03 NOTE — Progress Notes (Signed)
Subjective:    Patient ID: Veronica Stafford, female    DOB: 1928-11-18, 78 y.o.   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 allopurinol (ZYLOPRIM) 300 MG tablet Take 300 mg by mouth daily.        Marland Kitchen atenolol (TENORMIN) 25 MG tablet Take 25 mg by mouth daily.        . clonazePAM (KLONOPIN) 0.5 MG tablet Take 1 tablet (0.5 mg total) by mouth 3 (three) times daily as needed.  90 tablet  3  . cyanocobalamin (,VITAMIN B-12,) 1000 MCG/ML injection Inject 1,000 mcg into the muscle every 30 (thirty) days.        . ergocalciferol (VITAMIN D2) 50000 UNITS capsule Take 50,000 Units by mouth every 30 (thirty) days.        Marland Kitchen FLUoxetine (PROZAC) 40 MG capsule Take 1 capsule (40 mg total) by mouth daily.  30 capsule  11  . furosemide (LASIX) 80 MG tablet Take 80 mg by mouth daily.        Marland Kitchen gabapentin (NEURONTIN) 300 MG capsule Take 300 mg by mouth 3 (three) times daily.        Marland Kitchen glipiZIDE (GLUCOTROL) 5 MG tablet Take 5 mg by mouth daily.        Marland Kitchen lidocaine (LIDODERM) 5 % Place 1 patch onto the skin daily. Remove & Discard patch within 12 hours or as directed by MD       . meclizine (ANTIVERT) 12.5 MG tablet Take 12.5 mg by mouth 3 (three) times daily as needed.      . Multiple  Vitamins-Minerals (MULTIVITAMIN WITH MINERALS) tablet Take 1 tablet by mouth daily.        . polyethylene glycol (MIRALAX / GLYCOLAX) packet Take 17 g by mouth daily.        . ranitidine (ZANTAC) 150 MG tablet Take 150 mg by mouth 2 (two) times daily as needed.        . risperiDONE (RISPERDAL) 1 MG tablet Take 2 tablets (2 mg total) by mouth at bedtime.  30 tablet  11  . traMADol (ULTRAM) 50 MG tablet Take 100 mg by mouth 3 (three) times daily.       Marland Kitchen zolpidem (AMBIEN CR) 12.5 MG CR tablet Take 1 tablet (12.5 mg total) by mouth at bedtime as needed.  30 tablet  1    Allergies  Allergen Reactions  . Codeine   . Demerol   . Erythromycin   . Fentanyl   . Flagyl (Metronidazole Hcl)   . Morphine And Related     Past Medical History  Diagnosis Date  . NASH (nonalcoholic steatohepatitis)   . Vitamin B12 deficiency   . Psychosis     paranoid hallucinations  . Pancytopenia     Dr Osborne Oman infusions  .  Arthritis   . Anxiety   . Hypertension   . GERD (gastroesophageal reflux disease)   . Diabetes mellitus   . Osteoporosis   . Depression   . Lumbar disc disease   . Wrist fracture, left 9/12    Past Surgical History  Procedure Date  . Joint replacement     TKR bilateral  . Elbow surgery     4 on each  . Mandible surgery     twice  . Abdominal hysterectomy   . Cholecystectomy   . Breast surgery     breast reduction  . Hemorrhoid surgery   . Right ear lesion removal   . Fixation kyphoplasty 1/12  . Cataract extraction w/ intraocular lens  implant, bilateral     Family History  Problem Relation Age of Onset  . Cancer Mother     colon  . Heart disease Father     History   Social History  . Marital Status: Widowed    Spouse Name: N/A    Number of Children: N/A  . Years of Education: N/A   Occupational History  . Nurse     retired   Social History Main Topics  . Smoking status: Never Smoker   . Smokeless tobacco: Never Used  . Alcohol Use: No  . Drug  Use: Not on file  . Sexually Active: Not on file   Other Topics Concern  . Not on file   Social History Narrative   1 son nearby--he is to make medical decisions2 daughtersHas DNRWould not accept tube feeds   Review of Systems Sleeps very well Appetite is not that good Actually has gained some weight No sig edema     Objective:   Physical Exam  Constitutional: She appears well-developed and well-nourished. No distress.  Neck: Normal range of motion. Neck supple. No thyromegaly present.  Cardiovascular: Normal rate, regular rhythm and normal heart sounds.  Exam reveals no gallop.   No murmur heard.      ??very faint systolic murmur at apex---not clear cut  Pulmonary/Chest: Effort normal and breath sounds normal. No respiratory distress. She has no wheezes. She has no rales.  Abdominal: Soft. There is no tenderness.  Musculoskeletal: She exhibits no edema and no tenderness.  Lymphadenopathy:    She has no cervical adenopathy.  Psychiatric: She has a normal mood and affect. Her behavior is normal.          Assessment & Plan:

## 2011-09-03 NOTE — Assessment & Plan Note (Signed)
BP Readings from Last 3 Encounters:  09/03/11 122/62  07/23/11 118/60  06/04/11 112/60   Good control No changes needed

## 2011-09-03 NOTE — Assessment & Plan Note (Signed)
Good control without hypoglycemia Will decrease glipizide if runs low at all Lab Results  Component Value Date   HGBA1C 5.9 06/03/2011

## 2011-09-03 NOTE — Assessment & Plan Note (Signed)
Hallucinations controlled with risperdal May serve as adjunctive therapy for her depression as well No change

## 2011-09-03 NOTE — Assessment & Plan Note (Signed)
Generally satisfied with pain regimen Some concerns about her knees also but no instability

## 2011-09-03 NOTE — Assessment & Plan Note (Signed)
Mood has been good on the fluoxetine Wean not appropriate

## 2011-09-04 ENCOUNTER — Encounter: Payer: Medicare Other | Admitting: Internal Medicine

## 2011-10-23 ENCOUNTER — Other Ambulatory Visit: Payer: Self-pay | Admitting: Internal Medicine

## 2011-10-23 NOTE — Telephone Encounter (Signed)
rx called into pharmacy

## 2011-10-23 NOTE — Telephone Encounter (Signed)
Okay #90 x 5 Patient at [facility] assisted living

## 2011-11-08 HISTORY — PX: OTHER SURGICAL HISTORY: SHX169

## 2011-11-12 ENCOUNTER — Non-Acute Institutional Stay: Payer: Medicare Other | Admitting: Internal Medicine

## 2011-11-12 ENCOUNTER — Ambulatory Visit: Payer: Self-pay | Admitting: Orthopedic Surgery

## 2011-11-12 ENCOUNTER — Encounter: Payer: Self-pay | Admitting: Internal Medicine

## 2011-11-12 VITALS — BP 120/62 | HR 62 | Temp 98.1°F | Resp 22 | Wt 201.0 lb

## 2011-11-12 DIAGNOSIS — E119 Type 2 diabetes mellitus without complications: Secondary | ICD-10-CM

## 2011-11-12 DIAGNOSIS — F329 Major depressive disorder, single episode, unspecified: Secondary | ICD-10-CM

## 2011-11-12 DIAGNOSIS — F29 Unspecified psychosis not due to a substance or known physiological condition: Secondary | ICD-10-CM

## 2011-11-12 DIAGNOSIS — K219 Gastro-esophageal reflux disease without esophagitis: Secondary | ICD-10-CM

## 2011-11-12 DIAGNOSIS — F411 Generalized anxiety disorder: Secondary | ICD-10-CM

## 2011-11-12 DIAGNOSIS — M1710 Unilateral primary osteoarthritis, unspecified knee: Secondary | ICD-10-CM

## 2011-11-12 DIAGNOSIS — I10 Essential (primary) hypertension: Secondary | ICD-10-CM

## 2011-11-12 DIAGNOSIS — F419 Anxiety disorder, unspecified: Secondary | ICD-10-CM

## 2011-11-12 DIAGNOSIS — F3289 Other specified depressive episodes: Secondary | ICD-10-CM

## 2011-11-12 LAB — BASIC METABOLIC PANEL
Anion Gap: 6 — ABNORMAL LOW (ref 7–16)
Calcium, Total: 9 mg/dL (ref 8.5–10.1)
Chloride: 103 mmol/L (ref 98–107)
Co2: 31 mmol/L (ref 21–32)
Creatinine: 1.04 mg/dL (ref 0.60–1.30)
EGFR (African American): 58 — ABNORMAL LOW
EGFR (Non-African Amer.): 50 — ABNORMAL LOW
Glucose: 67 mg/dL (ref 65–99)
Osmolality: 280 (ref 275–301)

## 2011-11-12 LAB — HEPATIC FUNCTION PANEL A (ARMC)
Albumin: 3.6 g/dL (ref 3.4–5.0)
Bilirubin, Direct: 0.1 mg/dL (ref 0.00–0.20)
Total Protein: 7.8 g/dL (ref 6.4–8.2)

## 2011-11-12 LAB — SEDIMENTATION RATE: Erythrocyte Sed Rate: 22 mm/hr (ref 0–30)

## 2011-11-12 LAB — CBC
HCT: 35.2 % (ref 35.0–47.0)
MCH: 29.4 pg (ref 26.0–34.0)
MCHC: 33.9 g/dL (ref 32.0–36.0)
MCV: 87 fL (ref 80–100)
Platelet: 57 10*3/uL — ABNORMAL LOW (ref 150–440)
RDW: 15.1 % — ABNORMAL HIGH (ref 11.5–14.5)
WBC: 4 10*3/uL (ref 3.6–11.0)

## 2011-11-12 LAB — PROTIME-INR: INR: 1.1

## 2011-11-12 LAB — APTT: Activated PTT: 39.5 secs — ABNORMAL HIGH (ref 23.6–35.9)

## 2011-11-12 NOTE — Assessment & Plan Note (Signed)
Having revision of old TKR next week

## 2011-11-12 NOTE — Assessment & Plan Note (Signed)
Controlled with meds

## 2011-11-12 NOTE — Assessment & Plan Note (Signed)
Has been well controlled No changes needed

## 2011-11-12 NOTE — Assessment & Plan Note (Signed)
Controlled with the fluoxetine Needs to continue indefinitely

## 2011-11-12 NOTE — Assessment & Plan Note (Signed)
Notes that this is worse in the afternoon to evening Worried about change of shift and getting her meds correctly---especially on weekends

## 2011-11-12 NOTE — Progress Notes (Signed)
Subjective:    Patient ID: Veronica Stafford, female    DOB: 1928/11/14, 78 y.o.   MRN: [id number]  HPI Reviewed status with Albin Felling RN Doing well for the most part  Having replacement of cartilage portion of knee replacement in 1 week Dr Rosita Kea will be doing Will need to go to rehab briefly  No problems with the depression Satisfied here No problems with delusions or hallucinations  Needs thumb nail clipped Sugars have been good---usually under 120 fasting No hypoglycemic reactions  No chest pain No SOB Edema has been controlled No palpitations or syncope  No heartburn No dysphagia  Current Outpatient Prescriptions on File Prior to Visit  Medication Sig Dispense Refill  . allopurinol (ZYLOPRIM) 300 MG tablet Take 300 mg by mouth daily.        Marland Kitchen atenolol (TENORMIN) 25 MG tablet Take 25 mg by mouth daily.        . cyanocobalamin (,VITAMIN B-12,) 1000 MCG/ML injection Inject 1,000 mcg into the muscle every 30 (thirty) days.        . ergocalciferol (VITAMIN D2) 50000 UNITS capsule Take 50,000 Units by mouth every 30 (thirty) days.        Marland Kitchen FLUoxetine (PROZAC) 40 MG capsule Take 1 capsule (40 mg total) by mouth daily.  30 capsule  11  . furosemide (LASIX) 80 MG tablet Take 80 mg by mouth daily.        Marland Kitchen gabapentin (NEURONTIN) 300 MG capsule Take 300 mg by mouth 3 (three) times daily.        Marland Kitchen glipiZIDE (GLUCOTROL) 5 MG tablet Take 5 mg by mouth daily.        Marland Kitchen KLONOPIN 0.5 MG tablet ONE TAB BY MOUTH 3 TIMES A DAY PRN FOR ANXIETY (Limit 3 doses per 24h.)  90 each  5  . meclizine (ANTIVERT) 12.5 MG tablet Take 12.5 mg by mouth 3 (three) times daily as needed.      . Multiple Vitamins-Minerals (MULTIVITAMIN WITH MINERALS) tablet Take 1 tablet by mouth daily.        . polyethylene glycol (MIRALAX / GLYCOLAX) packet Take 17 g by mouth daily.        . ranitidine (ZANTAC) 150 MG tablet Take 150 mg by mouth 2 (two) times daily as needed.        . risperiDONE (RISPERDAL) 1 MG tablet Take 2  tablets (2 mg total) by mouth at bedtime.  30 tablet  11  . traMADol (ULTRAM) 50 MG tablet Take 100 mg by mouth 3 (three) times daily.       Marland Kitchen zolpidem (AMBIEN CR) 12.5 MG CR tablet Take 1 tablet (12.5 mg total) by mouth at bedtime as needed.  30 tablet  1    Allergies  Allergen Reactions  . Codeine   . Demerol   . Erythromycin   . Fentanyl   . Flagyl (Metronidazole Hcl)   . Morphine And Related     Past Medical History  Diagnosis Date  . NASH (nonalcoholic steatohepatitis)   . Vitamin B12 deficiency   . Psychosis     paranoid hallucinations  . Pancytopenia     Dr Osborne Oman infusions  . Arthritis   . Anxiety   . Hypertension   . GERD (gastroesophageal reflux disease)   . Diabetes mellitus   . Osteoporosis   . Depression   . Lumbar disc disease   . Wrist fracture, left 9/12    Past Surgical History  Procedure Date  . Joint  replacement     TKR bilateral  . Elbow surgery     4 on each  . Mandible surgery     twice  . Abdominal hysterectomy   . Cholecystectomy   . Breast surgery     breast reduction  . Hemorrhoid surgery   . Right ear lesion removal   . Fixation kyphoplasty 1/12  . Cataract extraction w/ intraocular lens  implant, bilateral     Family History  Problem Relation Age of Onset  . Cancer Mother     colon  . Heart disease Father     History   Social History  . Marital Status: Widowed    Spouse Name: N/A    Number of Children: N/A  . Years of Education: N/A   Occupational History  . Nurse     retired   Social History Main Topics  . Smoking status: Never Smoker   . Smokeless tobacco: Never Used  . Alcohol Use: No  . Drug Use: Not on file  . Sexually Active: Not on file   Other Topics Concern  . Not on file   Social History Narrative   1 son nearby--he is to make medical decisions2 daughtersHas DNRWould not accept tube feeds   Review of Systems Appetite is fine Weight fairly stable Bowels have been good Sleeping fine       Objective:   Physical Exam  Constitutional: She appears well-developed and well-nourished. No distress.  Neck: Normal range of motion. Neck supple. No thyromegaly present.  Cardiovascular: Normal rate, regular rhythm, normal heart sounds and intact distal pulses.  Exam reveals no gallop.   No murmur heard. Pulmonary/Chest: Effort normal and breath sounds normal. No respiratory distress. She has no wheezes. She has no rales.  Abdominal: Soft. There is no tenderness.  Musculoskeletal: She exhibits no edema and no tenderness.  Lymphadenopathy:    She has no cervical adenopathy.  Skin:       Thick mycotic thumbnails clipped down bilaterally  Psychiatric: She has a normal mood and affect. Her behavior is normal. Thought content normal.          Assessment & Plan:

## 2011-11-12 NOTE — Assessment & Plan Note (Signed)
Controlled with the risperidone Could consider trying to wean very slowly after knee surgery and rehab

## 2011-11-12 NOTE — Assessment & Plan Note (Signed)
BP Readings from Last 3 Encounters:  11/12/11 120/62  09/03/11 122/62  07/23/11 118/60   Good control No changes needed

## 2011-11-17 ENCOUNTER — Ambulatory Visit: Payer: Self-pay | Admitting: Internal Medicine

## 2011-11-17 LAB — CBC CANCER CENTER
Basophil #: 0 x10 3/mm (ref 0.0–0.1)
Basophil %: 0.6 %
HCT: 33.5 % — ABNORMAL LOW (ref 35.0–47.0)
Lymphocyte #: 0.7 x10 3/mm — ABNORMAL LOW (ref 1.0–3.6)
Lymphocyte %: 19.8 %
MCHC: 33.6 g/dL (ref 32.0–36.0)
MCV: 87 fL (ref 80–100)
Monocyte #: 0.3 x10 3/mm (ref 0.2–0.9)
Monocyte %: 9.4 %
Neutrophil %: 68.8 %
Platelet: 62 x10 3/mm — ABNORMAL LOW (ref 150–440)
RBC: 3.85 10*6/uL (ref 3.80–5.20)
RDW: 15.2 % — ABNORMAL HIGH (ref 11.5–14.5)
WBC: 3.3 x10 3/mm — ABNORMAL LOW (ref 3.6–11.0)

## 2011-11-17 LAB — IRON AND TIBC: Iron Saturation: 19 %

## 2011-11-17 LAB — FERRITIN: Ferritin (ARMC): 34 ng/mL (ref 8–388)

## 2011-11-19 ENCOUNTER — Inpatient Hospital Stay: Payer: Self-pay | Admitting: Orthopedic Surgery

## 2011-11-20 LAB — BASIC METABOLIC PANEL
Co2: 26 mmol/L (ref 21–32)
Creatinine: 1.06 mg/dL (ref 0.60–1.30)
EGFR (African American): 57 — ABNORMAL LOW
EGFR (Non-African Amer.): 49 — ABNORMAL LOW
Potassium: 3.9 mmol/L (ref 3.5–5.1)
Sodium: 143 mmol/L (ref 136–145)

## 2011-11-20 LAB — HEMOGLOBIN: HGB: 10.8 g/dL — ABNORMAL LOW (ref 12.0–16.0)

## 2011-11-20 LAB — PLATELET COUNT: Platelet: 51 10*3/uL — ABNORMAL LOW (ref 150–440)

## 2011-11-21 LAB — PLATELET COUNT: Platelet: 43 10*3/uL — ABNORMAL LOW (ref 150–440)

## 2011-11-21 LAB — BASIC METABOLIC PANEL
Anion Gap: 9 (ref 7–16)
BUN: 13 mg/dL (ref 7–18)
Calcium, Total: 7.7 mg/dL — ABNORMAL LOW (ref 8.5–10.1)
Chloride: 107 mmol/L (ref 98–107)
Co2: 25 mmol/L (ref 21–32)
EGFR (African American): >60
EGFR (Non-African Amer.): 56 — ABNORMAL LOW
Potassium: 3.6 mmol/L (ref 3.5–5.1)
Sodium: 141 mmol/L (ref 136–145)

## 2011-11-21 LAB — HEMOGLOBIN: HGB: 8.9 g/dL — ABNORMAL LOW (ref 12.0–16.0)

## 2011-11-22 LAB — PLATELET COUNT: Platelet: 45 x10 3/mm 3 — ABNORMAL LOW

## 2011-11-22 LAB — HEMOGLOBIN: HGB: 8.7 g/dL — ABNORMAL LOW (ref 12.0–16.0)

## 2011-11-24 DIAGNOSIS — I10 Essential (primary) hypertension: Secondary | ICD-10-CM

## 2011-11-24 DIAGNOSIS — F329 Major depressive disorder, single episode, unspecified: Secondary | ICD-10-CM

## 2011-11-24 DIAGNOSIS — M1710 Unilateral primary osteoarthritis, unspecified knee: Secondary | ICD-10-CM

## 2011-11-24 DIAGNOSIS — D696 Thrombocytopenia, unspecified: Secondary | ICD-10-CM

## 2011-12-08 ENCOUNTER — Ambulatory Visit: Payer: Self-pay | Admitting: Internal Medicine

## 2012-01-28 ENCOUNTER — Encounter: Payer: Self-pay | Admitting: Internal Medicine

## 2012-01-28 ENCOUNTER — Non-Acute Institutional Stay: Payer: Medicare Other | Admitting: Internal Medicine

## 2012-01-28 VITALS — BP 110/60 | HR 62 | Temp 98.0°F | Resp 20 | Wt 200.0 lb

## 2012-01-28 DIAGNOSIS — K219 Gastro-esophageal reflux disease without esophagitis: Secondary | ICD-10-CM

## 2012-01-28 DIAGNOSIS — E119 Type 2 diabetes mellitus without complications: Secondary | ICD-10-CM

## 2012-01-28 DIAGNOSIS — I10 Essential (primary) hypertension: Secondary | ICD-10-CM

## 2012-01-28 DIAGNOSIS — F329 Major depressive disorder, single episode, unspecified: Secondary | ICD-10-CM

## 2012-01-28 DIAGNOSIS — F29 Unspecified psychosis not due to a substance or known physiological condition: Secondary | ICD-10-CM

## 2012-01-28 DIAGNOSIS — M1710 Unilateral primary osteoarthritis, unspecified knee: Secondary | ICD-10-CM

## 2012-01-28 NOTE — Assessment & Plan Note (Signed)
Has been controlled well with the risperdal Wean is contraindicated

## 2012-01-28 NOTE — Assessment & Plan Note (Signed)
Does well on the fluoxetine Risperidone acts as adjunct for treatment

## 2012-01-28 NOTE — Assessment & Plan Note (Signed)
Lab Results  Component Value Date   HGBA1C 5.9 06/03/2011   Has been well controlled Recent checks when in hospital and rehab

## 2012-01-28 NOTE — Assessment & Plan Note (Signed)
Has done well after revision of left TKR May need to consider doing the right also

## 2012-01-28 NOTE — Assessment & Plan Note (Signed)
Controlled on the med Will continue

## 2012-01-28 NOTE — Assessment & Plan Note (Signed)
BP Readings from Last 3 Encounters:  01/28/12 110/60  11/12/11 120/62  09/03/11 122/62   Good control No changes needed

## 2012-01-28 NOTE — Progress Notes (Signed)
Subjective:    Patient ID: Veronica Stafford, female    DOB: 1929-02-19, 78 y.o.   MRN: [id number]  HPI Reviewed status with Veronica Felling  RN No new concerns  Did well after revision of left TKR---cartilage replaced Brief stay in health care Walking well with rollator  No pain problems now on left Has weakness and some pain on right---will need revision of that one also  No mental issues No paranoia, delusions or hallucinations  Sugars checked periodically Have been fine No hypoglycemia  No chest pain  No SOB No sig edema No dizziness or syncope  Stomach is "a little off"----means bowels don't move daily Does use the miralax No heartburn problems  Current Outpatient Prescriptions on File Prior to Visit  Medication Sig Dispense Refill  . allopurinol (ZYLOPRIM) 300 MG tablet Take 300 mg by mouth daily.        Marland Kitchen atenolol (TENORMIN) 25 MG tablet Take 25 mg by mouth daily.        . cyanocobalamin (,VITAMIN B-12,) 1000 MCG/ML injection Inject 1,000 mcg into the muscle every 30 (thirty) days.        . ergocalciferol (VITAMIN D2) 50000 UNITS capsule Take 50,000 Units by mouth every 30 (thirty) days.        Marland Kitchen FLUoxetine (PROZAC) 40 MG capsule Take 1 capsule (40 mg total) by mouth daily.  30 capsule  11  . furosemide (LASIX) 80 MG tablet Take 80 mg by mouth daily.        Marland Kitchen gabapentin (NEURONTIN) 300 MG capsule Take 300 mg by mouth 3 (three) times daily.        Marland Kitchen glipiZIDE (GLUCOTROL) 5 MG tablet Take 5 mg by mouth daily.        Marland Kitchen KLONOPIN 0.5 MG tablet ONE TAB BY MOUTH 3 TIMES A DAY PRN FOR ANXIETY (Limit 3 doses per 24h.)  90 each  5  . meclizine (ANTIVERT) 12.5 MG tablet Take 12.5 mg by mouth 3 (three) times daily as needed.      . Multiple Vitamins-Minerals (MULTIVITAMIN WITH MINERALS) tablet Take 1 tablet by mouth 2 (two) times daily.       . polyethylene glycol (MIRALAX / GLYCOLAX) packet Take 17 g by mouth daily.        . ranitidine (ZANTAC) 150 MG tablet Take 150 mg by mouth 2 (two)  times daily as needed.        . risperiDONE (RISPERDAL) 1 MG tablet Take 2 tablets (2 mg total) by mouth at bedtime.  30 tablet  11  . traMADol (ULTRAM) 50 MG tablet Take 100 mg by mouth 3 (three) times daily.       Marland Kitchen zolpidem (AMBIEN CR) 12.5 MG CR tablet Take 1 tablet (12.5 mg total) by mouth at bedtime as needed.  30 tablet  1    Allergies  Allergen Reactions  . Codeine   . Demerol   . Erythromycin   . Fentanyl   . Flagyl (Metronidazole Hcl)   . Morphine And Related     Past Medical History  Diagnosis Date  . NASH (nonalcoholic steatohepatitis)   . Vitamin B12 deficiency   . Psychosis     paranoid hallucinations  . Pancytopenia     Dr Osborne Oman infusions  . Arthritis   . Anxiety   . Hypertension   . GERD (gastroesophageal reflux disease)   . Diabetes mellitus   . Osteoporosis   . Depression   . Lumbar disc disease   . Wrist  fracture, left 9/12    Past Surgical History  Procedure Date  . Joint replacement     TKR bilateral  . Elbow surgery     4 on each  . Mandible surgery     twice  . Abdominal hysterectomy   . Cholecystectomy   . Breast surgery     breast reduction  . Hemorrhoid surgery   . Right ear lesion removal   . Fixation kyphoplasty 1/12  . Cataract extraction w/ intraocular lens  implant, bilateral   . Revision of tkr 9/13    Cartilage replaced    Family History  Problem Relation Age of Onset  . Cancer Mother     colon  . Heart disease Father     History   Social History  . Marital Status: Widowed    Spouse Name: N/A    Number of Children: N/A  . Years of Education: N/A   Occupational History  . Nurse     retired   Social History Main Topics  . Smoking status: Never Smoker   . Smokeless tobacco: Never Used  . Alcohol Use: No  . Drug Use: Not on file  . Sexually Active: Not on file   Other Topics Concern  . Not on file   Social History Narrative   1 son nearby--he is to make medical decisions2 daughtersHas DNRWould not  accept tube feeds   Review of Systems Appetite is off some--but "I eat enough though" Weight stable Sleeps real well--using the zolpidem regularly    Objective:   Physical Exam  Constitutional: She appears well-developed and well-nourished. No distress.  Neck: Normal range of motion. Neck supple. No thyromegaly present.  Cardiovascular: Normal rate, regular rhythm and normal heart sounds.  Exam reveals no gallop.   No murmur heard. Pulmonary/Chest: Effort normal and breath sounds normal. No respiratory distress. She has no wheezes. She has no rales.  Musculoskeletal: She exhibits no edema and no tenderness.  Lymphadenopathy:    She has no cervical adenopathy.  Skin:       Thick mycotic thumbnails cut with clippers bilaterally Some distal ingrowing on right was removed  Psychiatric: She has a normal mood and affect. Her behavior is normal.          Assessment & Plan:

## 2012-02-18 ENCOUNTER — Non-Acute Institutional Stay: Payer: Medicare Other | Admitting: Internal Medicine

## 2012-02-18 ENCOUNTER — Encounter: Payer: Self-pay | Admitting: Internal Medicine

## 2012-02-18 VITALS — BP 120/68 | HR 70 | Temp 97.5°F | Resp 18 | Wt 200.0 lb

## 2012-02-18 DIAGNOSIS — L02818 Cutaneous abscess of other sites: Secondary | ICD-10-CM

## 2012-02-18 DIAGNOSIS — L03818 Cellulitis of other sites: Secondary | ICD-10-CM

## 2012-02-18 DIAGNOSIS — L03811 Cellulitis of head [any part, except face]: Secondary | ICD-10-CM | POA: Insufficient documentation

## 2012-02-18 NOTE — Assessment & Plan Note (Signed)
Large lesion looks neoplastic but may have secondary infection Will need to be removed at derm in January Will treat with antibiotic just in case there is some infection--keflex for a week  Smaller lesion is actinic and may respond to liquid nitrogen only

## 2012-02-18 NOTE — Progress Notes (Signed)
Subjective:    Patient ID: Veronica Stafford, female    DOB: April 21, 1928, 78 y.o.   MRN: [id number]  HPI Has 2 lesions on scalp Goes back around 6 weeks Some itching Has noted discharge---has to use towel on her pillow at night  No Rx for this thus far Has appt with derm in early January but didn't think this should wait  Current Outpatient Prescriptions on File Prior to Visit  Medication Sig Dispense Refill  . allopurinol (ZYLOPRIM) 300 MG tablet Take 300 mg by mouth daily.        Marland Kitchen atenolol (TENORMIN) 25 MG tablet Take 25 mg by mouth daily.        . cyanocobalamin (,VITAMIN B-12,) 1000 MCG/ML injection Inject 1,000 mcg into the muscle every 30 (thirty) days.        . ergocalciferol (VITAMIN D2) 50000 UNITS capsule Take 50,000 Units by mouth every 30 (thirty) days.        Marland Kitchen FLUoxetine (PROZAC) 40 MG capsule Take 1 capsule (40 mg total) by mouth daily.  30 capsule  11  . furosemide (LASIX) 80 MG tablet Take 80 mg by mouth daily.        Marland Kitchen gabapentin (NEURONTIN) 300 MG capsule Take 300 mg by mouth 3 (three) times daily.        Marland Kitchen glipiZIDE (GLUCOTROL) 5 MG tablet Take 5 mg by mouth daily.        Marland Kitchen KLONOPIN 0.5 MG tablet ONE TAB BY MOUTH 3 TIMES A DAY PRN FOR ANXIETY (Limit 3 doses per 24h.)  90 each  5  . meclizine (ANTIVERT) 12.5 MG tablet Take 12.5 mg by mouth 3 (three) times daily as needed.      . Multiple Vitamins-Minerals (MULTIVITAMIN WITH MINERALS) tablet Take 1 tablet by mouth 2 (two) times daily.       . polyethylene glycol (MIRALAX / GLYCOLAX) packet Take 17 g by mouth daily.        . ranitidine (ZANTAC) 150 MG tablet Take 150 mg by mouth 2 (two) times daily as needed.        . risperiDONE (RISPERDAL) 1 MG tablet Take 2 tablets (2 mg total) by mouth at bedtime.  30 tablet  11  . sennosides-docusate sodium (SENOKOT-S) 8.6-50 MG tablet Take 1 tablet by mouth daily.      . traMADol (ULTRAM) 50 MG tablet Take 100 mg by mouth 3 (three) times daily.       Marland Kitchen zolpidem (AMBIEN CR) 12.5 MG CR  tablet Take 1 tablet (12.5 mg total) by mouth at bedtime as needed.  30 tablet  1    Allergies  Allergen Reactions  . Codeine   . Demerol   . Erythromycin   . Fentanyl   . Flagyl (Metronidazole Hcl)   . Morphine And Related     Past Medical History  Diagnosis Date  . NASH (nonalcoholic steatohepatitis)   . Vitamin B12 deficiency   . Psychosis     paranoid hallucinations  . Pancytopenia     Dr Osborne Oman infusions  . Arthritis   . Anxiety   . Hypertension   . GERD (gastroesophageal reflux disease)   . Diabetes mellitus   . Osteoporosis   . Depression   . Lumbar disc disease   . Wrist fracture, left 9/12    Past Surgical History  Procedure Date  . Joint replacement     TKR bilateral  . Elbow surgery     4 on each  .  Mandible surgery     twice  . Abdominal hysterectomy   . Cholecystectomy   . Breast surgery     breast reduction  . Hemorrhoid surgery   . Right ear lesion removal   . Fixation kyphoplasty 1/12  . Cataract extraction w/ intraocular lens  implant, bilateral   . Revision of tkr 9/13    Cartilage replaced    Family History  Problem Relation Age of Onset  . Cancer Mother     colon  . Heart disease Father     History   Social History  . Marital Status: Widowed    Spouse Name: N/A    Number of Children: N/A  . Years of Education: N/A   Occupational History  . Nurse     retired   Social History Main Topics  . Smoking status: Never Smoker   . Smokeless tobacco: Never Used  . Alcohol Use: No  . Drug Use: Not on file  . Sexually Active: Not on file   Other Topics Concern  . Not on file   Social History Narrative   1 son nearby--he is to make medical decisions2 daughtersHas DNRWould not accept tube feeds   Review of Systems No fever No known trauma     Objective:   Physical Exam  Constitutional: She appears well-developed and well-nourished. No distress.  Skin:       2 lesions on right parieto-occiptal scalp 1 is smaller  (~6-5mm)---crusted with slight inflammation Other is lower and larger  ~15x63mm. Inflamed with crusting and redness          Assessment & Plan:

## 2012-05-07 ENCOUNTER — Ambulatory Visit: Payer: Self-pay | Admitting: Internal Medicine

## 2012-05-09 LAB — HM DIABETES EYE EXAM

## 2012-05-17 LAB — CBC CANCER CENTER
Basophil #: 0 x10 3/mm (ref 0.0–0.1)
Basophil %: 0.8 %
Eosinophil #: 0.1 x10 3/mm (ref 0.0–0.7)
Eosinophil %: 1.8 %
HGB: 11 g/dL — ABNORMAL LOW (ref 12.0–16.0)
Lymphocyte #: 0.6 x10 3/mm — ABNORMAL LOW (ref 1.0–3.6)
MCV: 86 fL (ref 80–100)
Monocyte %: 9.3 %
Neutrophil %: 70.2 %
Platelet: 62 x10 3/mm — ABNORMAL LOW (ref 150–440)
RDW: 16.2 % — ABNORMAL HIGH (ref 11.5–14.5)
WBC: 3.4 x10 3/mm — ABNORMAL LOW (ref 3.6–11.0)

## 2012-05-17 LAB — FERRITIN: Ferritin (ARMC): 26 ng/mL (ref 8–388)

## 2012-05-30 ENCOUNTER — Other Ambulatory Visit: Payer: Self-pay | Admitting: *Deleted

## 2012-05-30 ENCOUNTER — Ambulatory Visit: Payer: Self-pay | Admitting: Ophthalmology

## 2012-05-30 MED ORDER — CLONAZEPAM 0.5 MG PO TABS: 0.5000 mg | ORAL_TABLET | Freq: Three times a day (TID) | ORAL | Status: DC | PRN

## 2012-05-30 NOTE — Telephone Encounter (Signed)
rx called into pharmacy

## 2012-05-30 NOTE — Telephone Encounter (Signed)
Okay #90 x 5 

## 2012-06-02 ENCOUNTER — Non-Acute Institutional Stay: Payer: Medicare Other | Admitting: Internal Medicine

## 2012-06-02 ENCOUNTER — Encounter: Payer: Self-pay | Admitting: Internal Medicine

## 2012-06-02 VITALS — BP 120/62 | HR 68 | Temp 98.0°F | Resp 22 | Wt 199.0 lb

## 2012-06-02 DIAGNOSIS — D61818 Other pancytopenia: Secondary | ICD-10-CM

## 2012-06-02 DIAGNOSIS — F32A Depression, unspecified: Secondary | ICD-10-CM

## 2012-06-02 DIAGNOSIS — F3289 Other specified depressive episodes: Secondary | ICD-10-CM

## 2012-06-02 DIAGNOSIS — F29 Unspecified psychosis not due to a substance or known physiological condition: Secondary | ICD-10-CM

## 2012-06-02 DIAGNOSIS — K7689 Other specified diseases of liver: Secondary | ICD-10-CM

## 2012-06-02 DIAGNOSIS — F329 Major depressive disorder, single episode, unspecified: Secondary | ICD-10-CM

## 2012-06-02 DIAGNOSIS — M5126 Other intervertebral disc displacement, lumbar region: Secondary | ICD-10-CM

## 2012-06-02 DIAGNOSIS — M5116 Intervertebral disc disorders with radiculopathy, lumbar region: Secondary | ICD-10-CM

## 2012-06-02 DIAGNOSIS — I10 Essential (primary) hypertension: Secondary | ICD-10-CM

## 2012-06-02 DIAGNOSIS — E119 Type 2 diabetes mellitus without complications: Secondary | ICD-10-CM

## 2012-06-02 DIAGNOSIS — K7581 Nonalcoholic steatohepatitis (NASH): Secondary | ICD-10-CM

## 2012-06-02 NOTE — Assessment & Plan Note (Signed)
BP Readings from Last 3 Encounters:  06/02/12 120/62  02/18/12 120/68  01/28/12 110/60   Good control No changes needed

## 2012-06-02 NOTE — Assessment & Plan Note (Signed)
Mood has been good Needs the fluoxetine indefinitely

## 2012-06-02 NOTE — Assessment & Plan Note (Signed)
Still goes every 6 months Will try to get his notes

## 2012-06-02 NOTE — Assessment & Plan Note (Signed)
No recurrence of paranoid hallucinations and delusions Will continue the risperdal since recurred when went off in past

## 2012-06-02 NOTE — Progress Notes (Signed)
Subjective:    Patient ID: Veronica Stafford, female    DOB: 1928/11/29, 79 y.o.   MRN: [id number]  HPI Reviewed status with Albin Felling RN  Did go to the dermatologist Both lesions were squamous cell carcinoma Has healed fine  Now in new bigger room Very happy with this  Has noted blurriness in eyes and tearing Just saw Dr Dorcas Mcmurray May have closed eye ducts--now referred to someone else  Mood is good No depression No hallucinations Sleeps well  Blood sugars checked monthly Have been fine No hypoglycemic reactions Labs ordered today  No chest pain No SOB No edema No dizziness or sycnope Walks with rollator  Chronic back pain Satisfied with current pain meds  Current Outpatient Prescriptions on File Prior to Visit  Medication Sig Dispense Refill  . allopurinol (ZYLOPRIM) 300 MG tablet Take 300 mg by mouth daily.        Marland Kitchen atenolol (TENORMIN) 25 MG tablet Take 25 mg by mouth daily.        . clonazePAM (KLONOPIN) 0.5 MG tablet Take 1 tablet (0.5 mg total) by mouth 3 (three) times daily as needed for anxiety.  90 tablet  5  . cyanocobalamin (,VITAMIN B-12,) 1000 MCG/ML injection Inject 1,000 mcg into the muscle every 30 (thirty) days.        . ergocalciferol (VITAMIN D2) 50000 UNITS capsule Take 50,000 Units by mouth every 30 (thirty) days.        Marland Kitchen FLUoxetine (PROZAC) 40 MG capsule Take 1 capsule (40 mg total) by mouth daily.  30 capsule  11  . furosemide (LASIX) 80 MG tablet Take 80 mg by mouth daily.        Marland Kitchen gabapentin (NEURONTIN) 300 MG capsule Take 300 mg by mouth 3 (three) times daily.        Marland Kitchen glipiZIDE (GLUCOTROL) 5 MG tablet Take 5 mg by mouth daily.        . meclizine (ANTIVERT) 12.5 MG tablet Take 12.5 mg by mouth 3 (three) times daily as needed.      . Multiple Vitamins-Minerals (MULTIVITAMIN WITH MINERALS) tablet Take 1 tablet by mouth 2 (two) times daily.       . polyethylene glycol (MIRALAX / GLYCOLAX) packet Take 17 g by mouth daily.        . ranitidine (ZANTAC)  150 MG tablet Take 150 mg by mouth 2 (two) times daily as needed.        . risperiDONE (RISPERDAL) 1 MG tablet Take 2 tablets (2 mg total) by mouth at bedtime.  30 tablet  11  . sennosides-docusate sodium (SENOKOT-S) 8.6-50 MG tablet Take 1 tablet by mouth daily.      . traMADol (ULTRAM) 50 MG tablet Take 100 mg by mouth 3 (three) times daily.       Marland Kitchen zolpidem (AMBIEN CR) 12.5 MG CR tablet Take 1 tablet (12.5 mg total) by mouth at bedtime as needed.  30 tablet  1   No current facility-administered medications on file prior to visit.    Allergies  Allergen Reactions  . Codeine   . Demerol   . Erythromycin   . Fentanyl   . Flagyl (Metronidazole Hcl)   . Morphine And Related     Past Medical History  Diagnosis Date  . NASH (nonalcoholic steatohepatitis)   . Vitamin B12 deficiency   . Psychosis     paranoid hallucinations  . Pancytopenia     Dr Osborne Oman infusions  . Arthritis   . Anxiety   .  Hypertension   . GERD (gastroesophageal reflux disease)   . Diabetes mellitus   . Osteoporosis   . Depression   . Lumbar disc disease   . Wrist fracture, left 9/12    Past Surgical History  Procedure Laterality Date  . Joint replacement      TKR bilateral  . Elbow surgery      4 on each  . Mandible surgery      twice  . Abdominal hysterectomy    . Cholecystectomy    . Breast surgery      breast reduction  . Hemorrhoid surgery    . Right ear lesion removal    . Fixation kyphoplasty  1/12  . Cataract extraction w/ intraocular lens  implant, bilateral    . Revision of tkr  9/13    Cartilage replaced    Family History  Problem Relation Age of Onset  . Cancer Mother     colon  . Heart disease Father     History   Social History  . Marital Status: Widowed    Spouse Name: N/A    Number of Children: N/A  . Years of Education: N/A   Occupational History  . Nurse     retired   Social History Main Topics  . Smoking status: Never Smoker   . Smokeless tobacco:  Never Used  . Alcohol Use: No  . Drug Use: Not on file  . Sexually Active: Not on file   Other Topics Concern  . Not on file   Social History Narrative   1 son nearby--he is to make medical decisions   2 daughters   Has DNR   Would not accept tube feeds   Review of Systems Appetite is good Weight stable No heartburn on meds Bowels regular on miralax    Objective:   Physical Exam  Constitutional: She appears well-developed and well-nourished. No distress.  Neck: Normal range of motion. Neck supple. No thyromegaly present.  Cardiovascular: Normal rate, regular rhythm, normal heart sounds and intact distal pulses.  Exam reveals no gallop.   No murmur heard. Pulmonary/Chest: Effort normal and breath sounds normal. No respiratory distress. She has no wheezes. She has no rales.  Abdominal: Soft. There is no tenderness.  Musculoskeletal: She exhibits no edema and no tenderness.  Lymphadenopathy:    She has no cervical adenopathy.  Skin: No rash noted. No erythema.  Slight plantar callous right foot Thick mycotic thumb nails clipped without difficulty  Psychiatric: She has a normal mood and affect. Her behavior is normal.          Assessment & Plan:

## 2012-06-02 NOTE — Assessment & Plan Note (Signed)
Last A1c in the normal range but overdue Will check labs now

## 2012-06-02 NOTE — Assessment & Plan Note (Signed)
No symptoms Will check labs

## 2012-06-02 NOTE — Assessment & Plan Note (Signed)
satisfied with pain relief No changes needed

## 2012-06-07 ENCOUNTER — Ambulatory Visit: Payer: Self-pay | Admitting: Internal Medicine

## 2012-06-27 ENCOUNTER — Other Ambulatory Visit: Payer: Self-pay | Admitting: *Deleted

## 2012-06-27 NOTE — Telephone Encounter (Signed)
Faxed refill request.  Last filled:  05/27/12  Please advise.

## 2012-06-28 MED ORDER — ZOLPIDEM TARTRATE ER 12.5 MG PO TBCR: 12.5000 mg | EXTENDED_RELEASE_TABLET | Freq: Every evening | ORAL | Status: DC | PRN

## 2012-06-28 NOTE — Telephone Encounter (Signed)
rx called into pharmacy

## 2012-06-28 NOTE — Telephone Encounter (Signed)
Okay #30 x 5 Assisted living resident with supervision

## 2012-07-07 ENCOUNTER — Emergency Department: Payer: Self-pay | Admitting: Emergency Medicine

## 2012-07-18 DIAGNOSIS — K7689 Other specified diseases of liver: Secondary | ICD-10-CM

## 2012-07-18 DIAGNOSIS — S42309A Unspecified fracture of shaft of humerus, unspecified arm, initial encounter for closed fracture: Secondary | ICD-10-CM

## 2012-07-18 DIAGNOSIS — F329 Major depressive disorder, single episode, unspecified: Secondary | ICD-10-CM

## 2012-07-18 DIAGNOSIS — I10 Essential (primary) hypertension: Secondary | ICD-10-CM

## 2012-08-16 DIAGNOSIS — F329 Major depressive disorder, single episode, unspecified: Secondary | ICD-10-CM

## 2012-08-16 DIAGNOSIS — I10 Essential (primary) hypertension: Secondary | ICD-10-CM

## 2012-08-16 DIAGNOSIS — S42309A Unspecified fracture of shaft of humerus, unspecified arm, initial encounter for closed fracture: Secondary | ICD-10-CM

## 2012-09-06 ENCOUNTER — Ambulatory Visit: Payer: Self-pay | Admitting: Ophthalmology

## 2012-09-15 ENCOUNTER — Encounter: Payer: Self-pay | Admitting: *Deleted

## 2012-09-15 ENCOUNTER — Ambulatory Visit: Payer: Medicare Other | Admitting: Internal Medicine

## 2012-09-16 ENCOUNTER — Encounter: Payer: Self-pay | Admitting: Internal Medicine

## 2012-09-16 ENCOUNTER — Ambulatory Visit (INDEPENDENT_AMBULATORY_CARE_PROVIDER_SITE_OTHER): Payer: Medicare Other | Admitting: Internal Medicine

## 2012-09-16 VITALS — BP 110/64 | HR 76 | Temp 98.1°F | Wt 191.0 lb

## 2012-09-16 DIAGNOSIS — K439 Ventral hernia without obstruction or gangrene: Secondary | ICD-10-CM

## 2012-09-16 NOTE — Assessment & Plan Note (Signed)
Large typical midline ventral hernia Basically no risk of incarceration or strangulation Reassured her  Hopefully she was just worried due to her mother's history (?incarcerated hernia?) Reassured Further testing or repair not a good idea (would need large mesh)  If pain persists, would check non contrast CT scan

## 2012-09-16 NOTE — Progress Notes (Signed)
Subjective:    Patient ID: Veronica Stafford, female    DOB: [birth year], 79 y.o.   MRN: [id number]  HPI Here with aide from [facility] assisted living  "something growing in my stomach" Directly over the umbilicus to left side and down  First noticed about 4 months ago Has gotten larger now Worried about it Notices pain at times---steady ache and occasional sharp pains Appetite is off No nausea or vomting  Current Outpatient Prescriptions on File Prior to Visit  Medication Sig Dispense Refill  . allopurinol (ZYLOPRIM) 300 MG tablet Take 300 mg by mouth daily.        Marland Kitchen atenolol (TENORMIN) 25 MG tablet Take 25 mg by mouth daily.        . clonazePAM (KLONOPIN) 0.5 MG tablet Take 1 tablet (0.5 mg total) by mouth 3 (three) times daily as needed for anxiety.  90 tablet  5  . cyanocobalamin (,VITAMIN B-12,) 1000 MCG/ML injection Inject 1,000 mcg into the muscle every 30 (thirty) days.        . ergocalciferol (VITAMIN D2) 50000 UNITS capsule Take 50,000 Units by mouth every 30 (thirty) days.        Marland Kitchen FLUoxetine (PROZAC) 40 MG capsule Take 1 capsule (40 mg total) by mouth daily.  30 capsule  11  . furosemide (LASIX) 80 MG tablet Take 80 mg by mouth daily.        Marland Kitchen gabapentin (NEURONTIN) 300 MG capsule Take 300 mg by mouth 3 (three) times daily.        Marland Kitchen glipiZIDE (GLUCOTROL) 5 MG tablet Take 5 mg by mouth daily.        . meclizine (ANTIVERT) 12.5 MG tablet Take 12.5 mg by mouth 3 (three) times daily as needed.      . Multiple Vitamins-Minerals (MULTIVITAMIN WITH MINERALS) tablet Take 1 tablet by mouth 2 (two) times daily.       . polyethylene glycol (MIRALAX / GLYCOLAX) packet Take 17 g by mouth daily.        . ranitidine (ZANTAC) 150 MG tablet Take 150 mg by mouth 2 (two) times daily as needed.        . risperiDONE (RISPERDAL) 1 MG tablet Take 2 tablets (2 mg total) by mouth at bedtime.  30 tablet  11  . sennosides-docusate sodium (SENOKOT-S) 8.6-50 MG tablet Take 1 tablet by mouth daily.      .  traMADol (ULTRAM) 50 MG tablet Take 100 mg by mouth 3 (three) times daily.       Marland Kitchen zolpidem (AMBIEN CR) 12.5 MG CR tablet Take 1 tablet (12.5 mg total) by mouth at bedtime as needed.  30 tablet  5   No current facility-administered medications on file prior to visit.    Allergies  Allergen Reactions  . Codeine   . Demerol   . Erythromycin   . Fentanyl   . Flagyl (Metronidazole Hcl)   . Morphine And Related     Past Medical History  Diagnosis Date  . NASH (nonalcoholic steatohepatitis)   . Vitamin B12 deficiency   . Psychosis     paranoid hallucinations  . Pancytopenia     Dr Osborne Oman infusions  . Arthritis   . Anxiety   . Hypertension   . GERD (gastroesophageal reflux disease)   . Diabetes mellitus   . Osteoporosis   . Depression   . Lumbar disc disease   . Wrist fracture, left 9/12    Past Surgical History  Procedure Laterality Date  .  Joint replacement      TKR bilateral  . Elbow surgery      4 on each  . Mandible surgery      twice  . Abdominal hysterectomy    . Cholecystectomy    . Breast surgery      breast reduction  . Hemorrhoid surgery    . Right ear lesion removal    . Fixation kyphoplasty  1/12  . Cataract extraction w/ intraocular lens  implant, bilateral    . Revision of tkr  9/13    Cartilage replaced    Family History  Problem Relation Age of Onset  . Cancer Mother     colon  . Heart disease Father     History   Social History  . Marital Status: Widowed    Spouse Name: N/A    Number of Children: N/A  . Years of Education: N/A   Occupational History  . Nurse     retired   Social History Main Topics  . Smoking status: Never Smoker   . Smokeless tobacco: Never Used  . Alcohol Use: No  . Drug Use: Not on file  . Sexually Active: Not on file   Other Topics Concern  . Not on file   Social History Narrative   1 son nearby--he is to make medical decisions   2 daughters   Has DNR   Would not accept tube feeds   Review  of Systems Weight is down 8# Bowels are okay No blood in stool No urinary problems    Objective:   Physical Exam  Constitutional: She appears well-developed and well-nourished. No distress.  Abdominal: Soft. Bowel sounds are normal. She exhibits no distension. There is no tenderness. There is no rebound and no guarding.  Broad ventral hernia from xiphoid to umbilicus--- as much as 5-6cm wide No umbilical hernia          Assessment & Plan:

## 2012-09-22 ENCOUNTER — Encounter: Payer: Self-pay | Admitting: Internal Medicine

## 2012-09-22 ENCOUNTER — Non-Acute Institutional Stay: Payer: Medicare Other | Admitting: Internal Medicine

## 2012-09-22 VITALS — BP 110/60 | HR 80 | Temp 97.0°F | Resp 14 | Wt 196.0 lb

## 2012-09-22 DIAGNOSIS — R1013 Epigastric pain: Secondary | ICD-10-CM

## 2012-09-22 NOTE — Assessment & Plan Note (Signed)
She had related this to the ventral hernia but that shouldn't cause this pain Also has had change in stool caliber  Will change ranitidine to omeprazole GI consultation pending

## 2012-09-22 NOTE — Progress Notes (Signed)
Subjective:    Patient ID: Veronica Stafford, female    DOB: December 14, 1928, 79 y.o.   MRN: [id number]  HPI Still having abdominal pain Worse after eating Still eats but not as much Weight is up some--may be just different scale  Bowels are okay--- "a little off" Small caliber stools now No blood   Current Outpatient Prescriptions on File Prior to Visit  Medication Sig Dispense Refill  . allopurinol (ZYLOPRIM) 300 MG tablet Take 300 mg by mouth daily.        Marland Kitchen atenolol (TENORMIN) 25 MG tablet Take 25 mg by mouth daily.        . clonazePAM (KLONOPIN) 0.5 MG tablet Take 1 tablet (0.5 mg total) by mouth 3 (three) times daily as needed for anxiety.  90 tablet  5  . cyanocobalamin (,VITAMIN B-12,) 1000 MCG/ML injection Inject 1,000 mcg into the muscle every 30 (thirty) days.        . ergocalciferol (VITAMIN D2) 50000 UNITS capsule Take 50,000 Units by mouth every 30 (thirty) days.        Marland Kitchen FLUoxetine (PROZAC) 40 MG capsule Take 1 capsule (40 mg total) by mouth daily.  30 capsule  11  . furosemide (LASIX) 80 MG tablet Take 80 mg by mouth daily.        Marland Kitchen gabapentin (NEURONTIN) 300 MG capsule Take 300 mg by mouth 3 (three) times daily.        Marland Kitchen glipiZIDE (GLUCOTROL) 5 MG tablet Take 5 mg by mouth daily.        . meclizine (ANTIVERT) 12.5 MG tablet Take 12.5 mg by mouth 3 (three) times daily as needed.      . Multiple Vitamins-Minerals (MULTIVITAMIN WITH MINERALS) tablet Take 1 tablet by mouth 2 (two) times daily.       . polyethylene glycol (MIRALAX / GLYCOLAX) packet Take 17 g by mouth daily.        . ranitidine (ZANTAC) 150 MG tablet Take 150 mg by mouth 2 (two) times daily as needed.        . risperiDONE (RISPERDAL) 1 MG tablet Take 2 tablets (2 mg total) by mouth at bedtime.  30 tablet  11  . sennosides-docusate sodium (SENOKOT-S) 8.6-50 MG tablet Take 1 tablet by mouth daily.      . traMADol (ULTRAM) 50 MG tablet Take 100 mg by mouth 3 (three) times daily.       Marland Kitchen zolpidem (AMBIEN CR) 12.5 MG CR  tablet Take 1 tablet (12.5 mg total) by mouth at bedtime as needed.  30 tablet  5   No current facility-administered medications on file prior to visit.    Allergies  Allergen Reactions  . Codeine   . Demerol   . Erythromycin   . Fentanyl   . Flagyl (Metronidazole Hcl)   . Morphine And Related     Past Medical History  Diagnosis Date  . NASH (nonalcoholic steatohepatitis)   . Vitamin B12 deficiency   . Psychosis     paranoid hallucinations  . Pancytopenia     Dr Osborne Oman infusions  . Arthritis   . Anxiety   . Hypertension   . GERD (gastroesophageal reflux disease)   . Diabetes mellitus   . Osteoporosis   . Depression   . Lumbar disc disease   . Wrist fracture, left 9/12    Past Surgical History  Procedure Laterality Date  . Joint replacement      TKR bilateral  . Elbow surgery  4 on each  . Mandible surgery      twice  . Abdominal hysterectomy    . Cholecystectomy    . Breast surgery      breast reduction  . Hemorrhoid surgery    . Right ear lesion removal    . Fixation kyphoplasty  1/12  . Cataract extraction w/ intraocular lens  implant, bilateral    . Revision of tkr  9/13    Cartilage replaced    Family History  Problem Relation Age of Onset  . Cancer Mother     colon  . Heart disease Father     History   Social History  . Marital Status: Widowed    Spouse Name: N/A    Number of Children: N/A  . Years of Education: N/A   Occupational History  . Nurse     retired   Social History Main Topics  . Smoking status: Never Smoker   . Smokeless tobacco: Never Used  . Alcohol Use: No  . Drug Use: Not on file  . Sexually Active: Not on file   Other Topics Concern  . Not on file   Social History Narrative   1 son nearby--he is to make medical decisions   2 daughters   Has DNR   Would not accept tube feeds   Review of Systems No fever Ongoing mycotic fingernail     Objective:   Physical Exam  Constitutional: She appears  well-developed and well-nourished. No distress.  Abdominal: Soft. She exhibits no distension and no mass. There is tenderness. There is no rebound and no guarding.  Musculoskeletal:  Mycotic thumb nails debrided with heavy duty nail clippers  Psychiatric: She has a normal mood and affect. Her behavior is normal.          Assessment & Plan:

## 2012-10-12 ENCOUNTER — Ambulatory Visit: Payer: Self-pay | Admitting: Unknown Physician Specialty

## 2012-10-19 ENCOUNTER — Other Ambulatory Visit: Payer: Self-pay | Admitting: Internal Medicine

## 2012-10-19 NOTE — Telephone Encounter (Signed)
Okay for a year 

## 2012-10-19 NOTE — Telephone Encounter (Signed)
Ok to fill 

## 2012-10-19 NOTE — Telephone Encounter (Signed)
rx sent to pharmacy by e-script  

## 2012-11-15 ENCOUNTER — Ambulatory Visit: Payer: Self-pay | Admitting: Internal Medicine

## 2012-11-16 ENCOUNTER — Other Ambulatory Visit: Payer: Self-pay | Admitting: *Deleted

## 2012-11-17 MED ORDER — TRAMADOL HCL 50 MG PO TABS: 100.0000 mg | ORAL_TABLET | Freq: Three times a day (TID) | ORAL | Status: DC

## 2012-11-17 NOTE — Telephone Encounter (Signed)
Okay #180 x 1 At [facility] assisted living

## 2012-11-17 NOTE — Telephone Encounter (Signed)
rx called into pharmacy

## 2012-12-01 ENCOUNTER — Non-Acute Institutional Stay: Payer: Medicare Other | Admitting: Internal Medicine

## 2012-12-01 ENCOUNTER — Encounter: Payer: Medicare Other | Admitting: Internal Medicine

## 2012-12-01 ENCOUNTER — Encounter: Payer: Self-pay | Admitting: Internal Medicine

## 2012-12-01 VITALS — BP 110/70 | HR 78 | Temp 97.1°F | Resp 22 | Wt 191.0 lb

## 2012-12-01 DIAGNOSIS — D61818 Other pancytopenia: Secondary | ICD-10-CM

## 2012-12-01 DIAGNOSIS — R1013 Epigastric pain: Secondary | ICD-10-CM

## 2012-12-01 DIAGNOSIS — F323 Major depressive disorder, single episode, severe with psychotic features: Secondary | ICD-10-CM

## 2012-12-01 DIAGNOSIS — E1149 Type 2 diabetes mellitus with other diabetic neurological complication: Secondary | ICD-10-CM

## 2012-12-01 DIAGNOSIS — I10 Essential (primary) hypertension: Secondary | ICD-10-CM

## 2012-12-01 DIAGNOSIS — M5116 Intervertebral disc disorders with radiculopathy, lumbar region: Secondary | ICD-10-CM

## 2012-12-01 DIAGNOSIS — M5126 Other intervertebral disc displacement, lumbar region: Secondary | ICD-10-CM

## 2012-12-01 NOTE — Progress Notes (Signed)
Subjective:    Patient ID: Veronica Stafford, female    DOB: September 02, 1928, 79 y.o.   MRN: [id number]  HPI Reviewed status with Katie RN  Still having some stomach trouble Appetite still not great No more weight loss over 6 months Saw GI-- CT looked okay and blood work Bowels have been okay Continues on bid PPI  Depression controlled  Satisfied with the med No hallucinations or delusions  Sugars checked regularly Usually under 108 No low sugar reactions Slight sensory changes in feet--no major pain  Back pain has been controlled Hasn't needed the prn meds much  Current Outpatient Prescriptions on File Prior to Visit  Medication Sig Dispense Refill  . allopurinol (ZYLOPRIM) 300 MG tablet Take 300 mg by mouth daily.        Marland Kitchen atenolol (TENORMIN) 25 MG tablet Take 25 mg by mouth daily.        . clonazePAM (KLONOPIN) 0.5 MG tablet Take 1 tablet (0.5 mg total) by mouth 3 (three) times daily as needed for anxiety.  90 tablet  5  . ergocalciferol (VITAMIN D2) 50000 UNITS capsule Take 50,000 Units by mouth every 30 (thirty) days.        Marland Kitchen FLUoxetine (PROZAC) 40 MG capsule Take 1 capsule (40 mg total) by mouth daily.  30 capsule  11  . furosemide (LASIX) 80 MG tablet Take 80 mg by mouth daily.        Marland Kitchen gabapentin (NEURONTIN) 300 MG capsule Take 300 mg by mouth 3 (three) times daily.        Marland Kitchen glipiZIDE (GLUCOTROL) 5 MG tablet Take 5 mg by mouth daily.        . meclizine (ANTIVERT) 12.5 MG tablet Take 12.5 mg by mouth 3 (three) times daily as needed.      . Multiple Vitamins-Minerals (MULTIVITAMIN WITH MINERALS) tablet Take 1 tablet by mouth 2 (two) times daily.       Marland Kitchen omeprazole (PRILOSEC) 20 MG capsule TAKE ONE CAPSULE BY MOUTH TWICE A DAY.  60 capsule  11  . polyethylene glycol (MIRALAX / GLYCOLAX) packet Take 17 g by mouth daily.        . risperiDONE (RISPERDAL) 1 MG tablet Take 2 tablets (2 mg total) by mouth at bedtime.  30 tablet  11  . sennosides-docusate sodium (SENOKOT-S) 8.6-50 MG  tablet Take 1 tablet by mouth daily.      . traMADol (ULTRAM) 50 MG tablet Take 2 tablets (100 mg total) by mouth 3 (three) times daily.  180 tablet  1  . zolpidem (AMBIEN CR) 12.5 MG CR tablet Take 1 tablet (12.5 mg total) by mouth at bedtime as needed.  30 tablet  5   No current facility-administered medications on file prior to visit.    Allergies  Allergen Reactions  . Codeine   . Demerol   . Erythromycin   . Fentanyl   . Flagyl [Metronidazole Hcl]   . Morphine And Related     Past Medical History  Diagnosis Date  . NASH (nonalcoholic steatohepatitis)   . Vitamin B12 deficiency   . Psychosis     paranoid hallucinations  . Pancytopenia     Dr Osborne Oman infusions  . Arthritis   . Anxiety   . Hypertension   . GERD (gastroesophageal reflux disease)   . Diabetes mellitus   . Osteoporosis   . Depression   . Lumbar disc disease   . Wrist fracture, left 9/12    Past Surgical History  Procedure  Laterality Date  . Joint replacement      TKR bilateral  . Elbow surgery      4 on each  . Mandible surgery      twice  . Abdominal hysterectomy    . Cholecystectomy    . Breast surgery      breast reduction  . Hemorrhoid surgery    . Right ear lesion removal    . Fixation kyphoplasty  1/12  . Cataract extraction w/ intraocular lens  implant, bilateral    . Revision of tkr  9/13    Cartilage replaced    Family History  Problem Relation Age of Onset  . Cancer Mother     colon  . Heart disease Father     History   Social History  . Marital Status: Widowed    Spouse Name: N/A    Number of Children: N/A  . Years of Education: N/A   Occupational History  . Nurse     retired   Social History Main Topics  . Smoking status: Never Smoker   . Smokeless tobacco: Never Used  . Alcohol Use: No  . Drug Use: Not on file  . Sexual Activity: Not on file   Other Topics Concern  . Not on file   Social History Narrative   1 son nearby--he is to make medical  decisions   2 daughters   Has DNR   Would not accept tube feeds   Review of Systems Sleeps well Voids fine--occ urge incontinence (wears pad) No skin problems     Objective:   Physical Exam  Constitutional: She appears well-developed and well-nourished. No distress.  Neck: Normal range of motion. Neck supple. No thyromegaly present.  Cardiovascular: Normal rate, regular rhythm, normal heart sounds and intact distal pulses.  Exam reveals no gallop.   No murmur heard. Pulmonary/Chest: Effort normal and breath sounds normal. No respiratory distress. She has no wheezes. She has no rales.  Abdominal: Soft. There is no tenderness. There is no rebound and no guarding.  Musculoskeletal: She exhibits no edema and no tenderness.  Mycotic thumbnails cut  Lymphadenopathy:    She has no cervical adenopathy.  Psychiatric: She has a normal mood and affect. Her behavior is normal.          Assessment & Plan:

## 2012-12-01 NOTE — Assessment & Plan Note (Signed)
Has been controlled with fluoxetine and risperdal Paranoid delusions and hallucinations in past Wean contraindicated

## 2012-12-01 NOTE — Assessment & Plan Note (Signed)
Will get copy of recent blood work at [facility]

## 2012-12-01 NOTE — Assessment & Plan Note (Signed)
Seems to have good control Mild sensory changes in feet but no sig pain Due for A1c---will order

## 2012-12-01 NOTE — Assessment & Plan Note (Signed)
Doing okay with current meds Hasn't generally needed the narcotics

## 2012-12-01 NOTE — Assessment & Plan Note (Signed)
GI eval and CT don't show diagnosis Continues on omeprazole No further testing Weight is holding

## 2012-12-01 NOTE — Assessment & Plan Note (Signed)
BP Readings from Last 3 Encounters:  12/01/12 110/70  09/22/12 110/60  09/16/12 110/64   Good control No changes indicated

## 2012-12-15 ENCOUNTER — Encounter: Payer: Self-pay | Admitting: Internal Medicine

## 2012-12-15 LAB — LIPID PANEL: LDL Cholesterol: 92 mg/dL

## 2012-12-15 LAB — HEMOGLOBIN A1C: Hgb A1c MFr Bld: 6.1 % — AB (ref 4.0–6.0)

## 2013-01-04 ENCOUNTER — Encounter: Payer: Self-pay | Admitting: Internal Medicine

## 2013-01-04 ENCOUNTER — Non-Acute Institutional Stay: Payer: Medicare Other | Admitting: Internal Medicine

## 2013-01-04 VITALS — BP 102/60 | HR 66 | Temp 98.0°F | Resp 20 | Wt 196.0 lb

## 2013-01-04 DIAGNOSIS — IMO0002 Reserved for concepts with insufficient information to code with codable children: Secondary | ICD-10-CM

## 2013-01-04 DIAGNOSIS — M25569 Pain in unspecified knee: Secondary | ICD-10-CM

## 2013-01-04 DIAGNOSIS — M25562 Pain in left knee: Secondary | ICD-10-CM

## 2013-01-04 DIAGNOSIS — M7052 Other bursitis of knee, left knee: Secondary | ICD-10-CM

## 2013-01-04 DIAGNOSIS — S83422A Sprain of lateral collateral ligament of left knee, initial encounter: Secondary | ICD-10-CM

## 2013-01-04 DIAGNOSIS — S83429A Sprain of lateral collateral ligament of unspecified knee, initial encounter: Secondary | ICD-10-CM

## 2013-01-04 MED ORDER — PREDNISONE 10 MG PO TABS: ORAL_TABLET | ORAL | Status: DC

## 2013-01-04 NOTE — Progress Notes (Signed)
Subjective:    Patient ID: Veronica Stafford, female    DOB: 1928-12-27, 79 y.o.   MRN: [id number]  HPI  Pt seen in ALF rm 211 today with c/o left knee pain. This started about 2 weeks ago. She was stepping off the bus when she noticed her left knee twist. She stumbled but did not experience a fall. She reports that she did hear a pop. She reports the pain is not constant, only when she walks on it. She does report that her knee feels unstable. She has had a left knee replacement. She is taking Advil, Ultram as needed for pain. This does seems to help. She has prn oxycodone if needed but reports she does not use it very often. She denies any numbness or tingling in the left leg.  Review of Systems      Past Medical History  Diagnosis Date  . NASH (nonalcoholic steatohepatitis)   . Vitamin B12 deficiency   . Psychosis     paranoid hallucinations  . Pancytopenia     Dr Osborne Oman infusions  . Arthritis   . Anxiety   . Hypertension   . GERD (gastroesophageal reflux disease)   . Diabetes mellitus   . Osteoporosis   . Depression   . Lumbar disc disease   . Wrist fracture, left 9/12    Current Outpatient Prescriptions  Medication Sig Dispense Refill  . allopurinol (ZYLOPRIM) 300 MG tablet Take 300 mg by mouth daily.        Marland Kitchen atenolol (TENORMIN) 25 MG tablet Take 25 mg by mouth daily.        . clonazePAM (KLONOPIN) 0.5 MG tablet Take 1 tablet (0.5 mg total) by mouth 3 (three) times daily as needed for anxiety.  90 tablet  5  . cyanocobalamin 1000 MCG tablet Take 100 mcg by mouth daily.      . ergocalciferol (VITAMIN D2) 50000 UNITS capsule Take 50,000 Units by mouth every 30 (thirty) days.        Marland Kitchen FLUoxetine (PROZAC) 40 MG capsule Take 1 capsule (40 mg total) by mouth daily.  30 capsule  11  . furosemide (LASIX) 80 MG tablet Take 80 mg by mouth daily.        Marland Kitchen gabapentin (NEURONTIN) 300 MG capsule Take 300 mg by mouth 3 (three) times daily.        Marland Kitchen glipiZIDE (GLUCOTROL) 5 MG tablet  Take 5 mg by mouth daily.        Marland Kitchen ibuprofen (ADVIL,MOTRIN) 800 MG tablet Take 800 mg by mouth 3 (three) times daily as needed for pain.      . meclizine (ANTIVERT) 12.5 MG tablet Take 12.5 mg by mouth 3 (three) times daily as needed.      . Multiple Vitamins-Minerals (MULTIVITAMIN WITH MINERALS) tablet Take 1 tablet by mouth 2 (two) times daily.       Marland Kitchen omeprazole (PRILOSEC) 20 MG capsule TAKE ONE CAPSULE BY MOUTH TWICE A DAY.  60 capsule  11  . oxyCODONE-acetaminophen (PERCOCET/ROXICET) 5-325 MG per tablet Take 1 tablet by mouth every 4 (four) hours as needed for pain.      . polyethylene glycol (MIRALAX / GLYCOLAX) packet Take 17 g by mouth daily.        . risperiDONE (RISPERDAL) 1 MG tablet Take 2 tablets (2 mg total) by mouth at bedtime.  30 tablet  11  . sennosides-docusate sodium (SENOKOT-S) 8.6-50 MG tablet Take 1 tablet by mouth daily.      Marland Kitchen  traMADol (ULTRAM) 50 MG tablet Take 2 tablets (100 mg total) by mouth 3 (three) times daily.  180 tablet  1  . zolpidem (AMBIEN CR) 12.5 MG CR tablet Take 1 tablet (12.5 mg total) by mouth at bedtime as needed.  30 tablet  5   No current facility-administered medications for this visit.    Allergies  Allergen Reactions  . Codeine   . Demerol   . Erythromycin   . Fentanyl   . Flagyl [Metronidazole Hcl]   . Morphine And Related     Family History  Problem Relation Age of Onset  . Cancer Mother     colon  . Heart disease Father     History   Social History  . Marital Status: Widowed    Spouse Name: N/A    Number of Children: N/A  . Years of Education: N/A   Occupational History  . Nurse     retired   Social History Main Topics  . Smoking status: Never Smoker   . Smokeless tobacco: Never Used  . Alcohol Use: No  . Drug Use: Not on file  . Sexual Activity: Not on file   Other Topics Concern  . Not on file   Social History Narrative   1 son nearby--he is to make medical decisions   2 daughters   Has DNR   Would not  accept tube feeds     Constitutional: Denies fever, malaise, fatigue, headache or abrupt weight changes.  Musculoskeletal: Denies decrease in range of motion, muscle pain or joint swelling.  Skin: Denies redness, rashes, lesions or ulcercations.    No other specific complaints in a complete review of systems (except as listed in HPI above).  Objective:   Physical Exam    BP 102/60  Pulse 66  Temp(Src) 98 F (36.7 C)  Resp 20  Wt 196 lb (88.905 kg) Wt Readings from Last 3 Encounters:  01/04/13 196 lb (88.905 kg)  12/01/12 191 lb (86.637 kg)  09/22/12 196 lb (88.905 kg)    General: Appears her stated age, well developed, well nourished in NAD. Skin: Warm, dry and intact. No rashes, lesions or ulcerations noted. Cardiovascular: Normal rate and rhythm. S1,S2 noted.  No murmur, rubs or gallops noted. No JVD or BLE edema. No carotid bruits noted. Pulmonary/Chest: Normal effort and positive vesicular breath sounds. No respiratory distress. No wheezes, rales or ronchi noted.  Musculoskeletal: Crepitus noted with flexion/extension of the knee. Tender to palpation of the pes bursa. Tender to palpation of the lateral joint line and lateral collateral ligament. Negative drawer test. Strength 4/5 BLE.      Assessment & Plan:   Left knee pain, pes bursitis with possible minor tear of LCL, secondary to twisting of the knee:  Will give you a prednisone taper for the inflammation of the bursa and pain of the joint line, LCL. Can take tramadol if needed Wear a supportive knee brace when walking-RX given today Pt declines PT if the pain is no better in 2 weeks She would prefer to follow up with her orthopedist in 2 weeks if she is not better  Let Angelica Chessman, RN if the pain persist or worsens >  2 weeks

## 2013-01-04 NOTE — Patient Instructions (Signed)
Knee Sprain A knee sprain is a tear in one of the strong, fibrous tissues that connect the bones (ligaments) in your knee. The severity of the sprain depends on how much of the ligament is torn. The tear can be either partial or complete. CAUSES  Often, sprains are a result of a fall or injury. The force of the impact causes the fibers of your ligament to stretch too much. This excess tension causes the fibers of your ligament to tear. SYMPTOMS  You may have some loss of motion in your knee. Other symptoms include:  Bruising.  Tenderness.  Swelling. DIAGNOSIS  In order to diagnose knee sprain, your caregiver will physically examine your knee to determine how torn the ligament is. Your caregiver may also suggest an X-ray exam of your knee to make sure no bones are broken. TREATMENT  If your ligament is only partially torn, treatment usually involves keeping the knee in a fixed position (immobilization) or bracing your knee for activities that require movement for several weeks. To do this, your caregiver will apply a bandage, cast, or splint to keep your knee from moving or support your knee during movement until it heals. For a partially torn ligament, the healing process usually takes 4 to 6 weeks. If your ligament is completely torn, depending on which ligament it is, you may need surgery to reconnect the ligament to the bone or reconstruct it. After surgery, a cast or splint may be applied and will need to stay on your knee for 4 to 6 weeks while your ligament heals. HOME CARE INSTRUCTIONS  Keep your injured knee elevated to decrease swelling.  To ease pain and swelling, apply ice to your knee twice a day, for 2 to 3 days:  Put ice in a plastic bag.  Place a towel between your skin and the bag.  Leave the ice on for 15 minutes.  Only take over-the-counter or prescription medicine for pain as directed by your caregiver.  Do not leave your knee unprotected until pain and stiffness go  away (usually 4 to 6 weeks).  Do not allow your cast or splint to get wet. If you have been instructed not to remove it, cover your cast or splint with a plastic bag when you shower or bathe. Do not swim.  Your caregiver may suggest exercises for you to do during your recovery to prevent or limit permanent weakness and stiffness. SEEK IMMEDIATE MEDICAL CARE IF:  Your cast or splint becomes damaged.  Your pain becomes worse. MAKE SURE YOU:  Understand these instructions.  Will watch your condition.  Will get help right away if you are not doing well or get worse. Document Released: 02/23/2005 Document Revised: 05/18/2011 Document Reviewed: 02/07/2011 [employer] Patient Information 2014 [employer].  

## 2013-02-28 ENCOUNTER — Encounter: Payer: Self-pay | Admitting: Internal Medicine

## 2013-02-28 ENCOUNTER — Non-Acute Institutional Stay: Payer: Medicare Other | Admitting: Internal Medicine

## 2013-02-28 VITALS — BP 110/64 | HR 66 | Temp 97.8°F | Resp 16

## 2013-02-28 DIAGNOSIS — S81009A Unspecified open wound, unspecified knee, initial encounter: Secondary | ICD-10-CM

## 2013-02-28 DIAGNOSIS — I739 Peripheral vascular disease, unspecified: Secondary | ICD-10-CM

## 2013-02-28 DIAGNOSIS — E119 Type 2 diabetes mellitus without complications: Secondary | ICD-10-CM

## 2013-02-28 DIAGNOSIS — S81802A Unspecified open wound, left lower leg, initial encounter: Secondary | ICD-10-CM

## 2013-02-28 MED ORDER — TRIPLE ANTIBIOTIC 5-400-5000 EX OINT: TOPICAL_OINTMENT | Freq: Four times a day (QID) | CUTANEOUS | Status: DC

## 2013-02-28 NOTE — Progress Notes (Signed)
Subjective:    Patient ID: Veronica Stafford, female    DOB: 07-02-1928, 79 y.o.   MRN: [id number]  HPI  Pt seen in ALF rm 211 for left leg open wound. She noticed this about a week ago. She does not remember hitting her leg on anything. There is an area of redness around the wound. It is not tender to touch. The open area does sting a little bit. It has not drained. It has not gotten worse but it is not getting better. She denies fever, chills or body aches.  Review of Systems      Past Medical History  Diagnosis Date  . NASH (nonalcoholic steatohepatitis)   . Vitamin B12 deficiency   . Psychosis     paranoid hallucinations  . Pancytopenia     Dr Osborne Oman infusions  . Arthritis   . Anxiety   . Hypertension   . GERD (gastroesophageal reflux disease)   . Diabetes mellitus   . Osteoporosis   . Depression   . Lumbar disc disease   . Wrist fracture, left 9/12    Current Outpatient Prescriptions  Medication Sig Dispense Refill  . allopurinol (ZYLOPRIM) 300 MG tablet Take 300 mg by mouth daily.        Marland Kitchen atenolol (TENORMIN) 25 MG tablet Take 25 mg by mouth daily.        . clonazePAM (KLONOPIN) 0.5 MG tablet Take 1 tablet (0.5 mg total) by mouth 3 (three) times daily as needed for anxiety.  90 tablet  5  . cyanocobalamin 1000 MCG tablet Take 100 mcg by mouth daily.      . ergocalciferol (VITAMIN D2) 50000 UNITS capsule Take 50,000 Units by mouth every 30 (thirty) days.        Marland Kitchen FLUoxetine (PROZAC) 40 MG capsule Take 1 capsule (40 mg total) by mouth daily.  30 capsule  11  . furosemide (LASIX) 80 MG tablet Take 80 mg by mouth daily.        Marland Kitchen gabapentin (NEURONTIN) 300 MG capsule Take 300 mg by mouth 3 (three) times daily.        Marland Kitchen glipiZIDE (GLUCOTROL) 5 MG tablet Take 5 mg by mouth daily.        Marland Kitchen ibuprofen (ADVIL,MOTRIN) 800 MG tablet Take 800 mg by mouth 3 (three) times daily as needed for pain.      . meclizine (ANTIVERT) 12.5 MG tablet Take 12.5 mg by mouth 3 (three) times  daily as needed.      . Multiple Vitamins-Minerals (MULTIVITAMIN WITH MINERALS) tablet Take 1 tablet by mouth 2 (two) times daily.       Marland Kitchen omeprazole (PRILOSEC) 20 MG capsule TAKE ONE CAPSULE BY MOUTH TWICE A DAY.  60 capsule  11  . oxyCODONE-acetaminophen (PERCOCET/ROXICET) 5-325 MG per tablet Take 1 tablet by mouth every 4 (four) hours as needed for pain.      . polyethylene glycol (MIRALAX / GLYCOLAX) packet Take 17 g by mouth daily.        . predniSONE (DELTASONE) 10 MG tablet Take 3 tablets on days 1-3, take 2 tablets on days 4-6, take 1 tablet on days 7-9  18 tablet  0  . risperiDONE (RISPERDAL) 1 MG tablet Take 2 tablets (2 mg total) by mouth at bedtime.  30 tablet  11  . sennosides-docusate sodium (SENOKOT-S) 8.6-50 MG tablet Take 1 tablet by mouth daily.      . traMADol (ULTRAM) 50 MG tablet Take 2 tablets (100 mg  total) by mouth 3 (three) times daily.  180 tablet  1  . zolpidem (AMBIEN CR) 12.5 MG CR tablet Take 1 tablet (12.5 mg total) by mouth at bedtime as needed.  30 tablet  5   No current facility-administered medications for this visit.    Allergies  Allergen Reactions  . Codeine   . Demerol   . Erythromycin   . Fentanyl   . Flagyl [Metronidazole Hcl]   . Morphine And Related     Family History  Problem Relation Age of Onset  . Cancer Mother     colon  . Heart disease Father     History   Social History  . Marital Status: Widowed    Spouse Name: N/A    Number of Children: N/A  . Years of Education: N/A   Occupational History  . Nurse     retired   Social History Main Topics  . Smoking status: Never Smoker   . Smokeless tobacco: Never Used  . Alcohol Use: No  . Drug Use: Not on file  . Sexual Activity: Not on file   Other Topics Concern  . Not on file   Social History Narrative   1 son nearby--he is to make medical decisions   2 daughters   Has DNR   Would not accept tube feeds     Constitutional: Denies fever, malaise, fatigue, headache or  abrupt weight changes.  Skin: Denies redness, rashes, or drainage from the wound.   No other specific complaints in a complete review of systems (except as listed in HPI above).  Objective:   Physical Exam  BP 110/64  Pulse 66  Temp(Src) 97.8 F (36.6 C)  Resp 16 Wt Readings from Last 3 Encounters:  01/04/13 196 lb (88.905 kg)  12/01/12 191 lb (86.637 kg)  09/22/12 196 lb (88.905 kg)    General: Appears their stated age, well developed, well nourished in NAD. Skin: Warm, dry and intact. Small dime size skin tear with central opening noted on left anterior lower leg. Scabbed over with small area of redness surrounding the wound. No drainage noted.  Cardiovascular: Normal rate and rhythm. S1,S2 noted.  No murmur, rubs or gallops noted. No JVD or BLE edema. No carotid bruits noted. Pulmonary/Chest: Normal effort and positive vesicular breath sounds. No respiratory distress. No wheezes, rales or ronchi noted.           Assessment & Plan:   Delayed wound healing, likely secondary to diabetes and peripheral vascular disease:  We will wah the wound daily with warm water and soap Place a thin layer of triple antibiotic ointment over scabbed area and cover with a bandage Change daily If no improvement in 1 week, notify provider

## 2013-02-28 NOTE — Patient Instructions (Signed)
Laceration Care, Adult °A laceration is a cut that goes through all layers of the skin. The cut goes into the tissue beneath the skin. °HOME CARE °For stitches (sutures) or staples: °· Keep the cut clean and dry. °· If you have a bandage (dressing), change it at least once a day. Change the bandage if it gets wet or dirty, or as told by your doctor. °· Wash the cut with soap and water 2 times a day. Rinse the cut with water. Pat it dry with a clean towel. °· Put a thin layer of medicated cream on the cut as told by your doctor. °· You may shower after the first 24 hours. Do not soak the cut in water until the stitches are removed. °· Only take medicines as told by your doctor. °· Have your stitches or staples removed as told by your doctor. °For skin adhesive strips: °· Keep the cut clean and dry. °· Do not get the strips wet. You may take a bath, but be careful to keep the cut dry. °· If the cut gets wet, pat it dry with a clean towel. °· The strips will fall off on their own. Do not remove the strips that are still stuck to the cut. °For wound glue: °· You may shower or take baths. Do not soak or scrub the cut. Do not swim. Avoid heavy sweating until the glue falls off on its own. After a shower or bath, pat the cut dry with a clean towel. °· Do not put medicine on your cut until the glue falls off. °· If you have a bandage, do not put tape over the glue. °· Avoid lots of sunlight or tanning lamps until the glue falls off. Put sunscreen on the cut for the first year to reduce your scar. °· The glue will fall off on its own. Do not pick at the glue. °You may need a tetanus shot if: °· You cannot remember when you had your last tetanus shot. °· You have never had a tetanus shot. °If you need a tetanus shot and you choose not to have one, you may get tetanus. Sickness from tetanus can be serious. °GET HELP RIGHT AWAY IF:  °· Your pain does not get better with medicine. °· Your arm, hand, leg, or foot loses feeling  (numbness) or changes color. °· Your cut is bleeding. °· Your joint feels weak, or you cannot use your joint. °· You have painful lumps on your body. °· Your cut is red, puffy (swollen), or painful. °· You have a red line on the skin near the cut. °· You have yellowish-white fluid (pus) coming from the cut. °· You have a fever. °· You have a bad smell coming from the cut or bandage. °· Your cut breaks open before or after stitches are removed. °· You notice something coming out of the cut, such as wood or glass. °· You cannot move a finger or toe. °MAKE SURE YOU:  °· Understand these instructions. °· Will watch your condition. °· Will get help right away if you are not doing well or get worse. °Document Released: 08/12/2007 Document Revised: 05/18/2011 Document Reviewed: 08/19/2010 °[employer]® Patient Information ©2014 [employer]. ° ° ° ° °

## 2013-03-23 ENCOUNTER — Non-Acute Institutional Stay: Payer: Medicare Other | Admitting: Internal Medicine

## 2013-03-23 ENCOUNTER — Encounter: Payer: Self-pay | Admitting: Internal Medicine

## 2013-03-23 VITALS — BP 112/64 | HR 66 | Resp 12 | Wt 195.0 lb

## 2013-03-23 DIAGNOSIS — I10 Essential (primary) hypertension: Secondary | ICD-10-CM

## 2013-03-23 DIAGNOSIS — E1142 Type 2 diabetes mellitus with diabetic polyneuropathy: Secondary | ICD-10-CM | POA: Insufficient documentation

## 2013-03-23 DIAGNOSIS — L989 Disorder of the skin and subcutaneous tissue, unspecified: Secondary | ICD-10-CM

## 2013-03-23 DIAGNOSIS — F323 Major depressive disorder, single episode, severe with psychotic features: Secondary | ICD-10-CM

## 2013-03-23 DIAGNOSIS — E1149 Type 2 diabetes mellitus with other diabetic neurological complication: Secondary | ICD-10-CM

## 2013-03-23 DIAGNOSIS — M5126 Other intervertebral disc displacement, lumbar region: Secondary | ICD-10-CM

## 2013-03-23 DIAGNOSIS — M5116 Intervertebral disc disorders with radiculopathy, lumbar region: Secondary | ICD-10-CM

## 2013-03-23 DIAGNOSIS — K439 Ventral hernia without obstruction or gangrene: Secondary | ICD-10-CM

## 2013-03-23 NOTE — Assessment & Plan Note (Signed)
No significant pain on the gabapentin

## 2013-03-23 NOTE — Assessment & Plan Note (Signed)
Discussed that surgery is not a good idea

## 2013-03-23 NOTE — Assessment & Plan Note (Signed)
Does okay with the tramadol Mobility is fair  Still independent with ADLs other than some assist with showering

## 2013-03-23 NOTE — Assessment & Plan Note (Signed)
Looks like a carcinoma Will set up derm eval

## 2013-03-23 NOTE — Assessment & Plan Note (Signed)
In remission on meds Wean of antipsychotic is contraindicated

## 2013-03-23 NOTE — Assessment & Plan Note (Signed)
BP Readings from Last 3 Encounters:  03/23/13 112/64  02/28/13 110/64  01/04/13 102/60   Good control

## 2013-03-23 NOTE — Progress Notes (Signed)
Subjective:    Patient ID: Veronica Stafford, female    DOB: May 13, 1928, 80 y.o.   MRN: [id number]  HPI Reviewed status with [facility] RN  Still has sore spot on right calf Not painful Hasn't improved at all  Bothered by her ventral hernia Pushes against elastic of her pants Seems to be bigger Bowels have been okay No nausea or vomiting  No problems with diabetes Control is good--no hypoglycemia No sores or pain in feet  Depression is controlled No mood problems No hallucinations or psychosis  No chest pain No palpitations No edema No SOB No dizziness or syncope  Some back pain Good days and bad days Usually satisfied with the tramadol Hasn't needed the percocet  Current Outpatient Prescriptions on File Prior to Visit  Medication Sig Dispense Refill  . allopurinol (ZYLOPRIM) 300 MG tablet Take 300 mg by mouth daily.        Marland Kitchen atenolol (TENORMIN) 25 MG tablet Take 25 mg by mouth daily.        . clonazePAM (KLONOPIN) 0.5 MG tablet Take 1 tablet (0.5 mg total) by mouth 3 (three) times daily as needed for anxiety.  90 tablet  5  . cyanocobalamin 1000 MCG tablet Take 100 mcg by mouth daily.      . ergocalciferol (VITAMIN D2) 50000 UNITS capsule Take 50,000 Units by mouth every 30 (thirty) days.        Marland Kitchen FLUoxetine (PROZAC) 40 MG capsule Take 1 capsule (40 mg total) by mouth daily.  30 capsule  11  . furosemide (LASIX) 80 MG tablet Take 80 mg by mouth daily.        Marland Kitchen gabapentin (NEURONTIN) 300 MG capsule Take 300 mg by mouth 3 (three) times daily.        Marland Kitchen glipiZIDE (GLUCOTROL) 5 MG tablet Take 5 mg by mouth daily.        Marland Kitchen ibuprofen (ADVIL,MOTRIN) 800 MG tablet Take 800 mg by mouth 3 (three) times daily as needed for pain.      . meclizine (ANTIVERT) 12.5 MG tablet Take 12.5 mg by mouth 3 (three) times daily as needed.      . Multiple Vitamins-Minerals (MULTIVITAMIN WITH MINERALS) tablet Take 1 tablet by mouth 2 (two) times daily.       Marland Kitchen neomycin-bacitracin-polymyxin (NEOSPORIN)  [phone] ointment Apply topically 4 (four) times daily.  28.3 g  0  . omeprazole (PRILOSEC) 20 MG capsule TAKE ONE CAPSULE BY MOUTH TWICE A DAY.  60 capsule  11  . oxyCODONE-acetaminophen (PERCOCET/ROXICET) 5-325 MG per tablet Take 1 tablet by mouth every 4 (four) hours as needed for pain.      . polyethylene glycol (MIRALAX / GLYCOLAX) packet Take 17 g by mouth daily.        . predniSONE (DELTASONE) 10 MG tablet Take 3 tablets on days 1-3, take 2 tablets on days 4-6, take 1 tablet on days 7-9  18 tablet  0  . risperiDONE (RISPERDAL) 1 MG tablet Take 2 tablets (2 mg total) by mouth at bedtime.  30 tablet  11  . sennosides-docusate sodium (SENOKOT-S) 8.6-50 MG tablet Take 1 tablet by mouth daily.      . traMADol (ULTRAM) 50 MG tablet Take 2 tablets (100 mg total) by mouth 3 (three) times daily.  180 tablet  1  . zolpidem (AMBIEN CR) 12.5 MG CR tablet Take 1 tablet (12.5 mg total) by mouth at bedtime as needed.  30 tablet  5   No current  facility-administered medications on file prior to visit.    Allergies  Allergen Reactions  . Codeine   . Demerol   . Erythromycin   . Fentanyl   . Flagyl [Metronidazole Hcl]   . Morphine And Related     Past Medical History  Diagnosis Date  . NASH (nonalcoholic steatohepatitis)   . Vitamin B12 deficiency   . Psychosis     paranoid hallucinations  . Pancytopenia     Dr Danie Chandler infusions  . Arthritis   . Anxiety   . Hypertension   . GERD (gastroesophageal reflux disease)   . Diabetes mellitus   . Osteoporosis   . Depression   . Lumbar disc disease   . Wrist fracture, left 9/12    Past Surgical History  Procedure Laterality Date  . Joint replacement      TKR bilateral  . Elbow surgery      4 on each  . Mandible surgery      twice  . Abdominal hysterectomy    . Cholecystectomy    . Breast surgery      breast reduction  . Hemorrhoid surgery    . Right ear lesion removal    . Fixation kyphoplasty  1/12  . Cataract extraction  w/ intraocular lens  implant, bilateral    . Revision of tkr  9/13    Cartilage replaced    Family History  Problem Relation Age of Onset  . Cancer Mother     colon  . Heart disease Father     History   Social History  . Marital Status: Widowed    Spouse Name: N/A    Number of Children: N/A  . Years of Education: N/A   Occupational History  . Nurse     retired   Social History Main Topics  . Smoking status: Never Smoker   . Smokeless tobacco: Never Used  . Alcohol Use: No  . Drug Use: Not on file  . Sexual Activity: Not on file   Other Topics Concern  . Not on file   Social History Narrative   1 son nearby--he is to make medical decisions   2 daughters   Has DNR   Would not accept tube feeds   Review of Systems Appetite is fair Weight stable    Objective:   Physical Exam  Constitutional: She appears well-developed and well-nourished. No distress.  Neck: Normal range of motion. Neck supple. No thyromegaly present.  Cardiovascular: Normal rate, regular rhythm, normal heart sounds and intact distal pulses.  Exam reveals no gallop.   No murmur heard. Pulmonary/Chest: Effort normal and breath sounds normal. No respiratory distress. She has no wheezes. She has no rales.  Abdominal: Soft. There is no tenderness.  Musculoskeletal: She exhibits no edema and no tenderness.  Lymphadenopathy:    She has no cervical adenopathy.  Skin: No rash noted.  No foot lesions Mildly raised inflammatory lesion on right calf  Psychiatric: She has a normal mood and affect. Her behavior is normal.          Assessment & Plan:

## 2013-03-23 NOTE — Assessment & Plan Note (Signed)
Lab Results  Component Value Date   HGBA1C 6.1* 12/15/2012   Good control No changes needed

## 2013-03-24 ENCOUNTER — Telehealth: Payer: Self-pay

## 2013-03-24 NOTE — Telephone Encounter (Signed)
Relevant patient education mailed to patient.  

## 2013-04-12 ENCOUNTER — Telehealth: Payer: Self-pay | Admitting: Internal Medicine

## 2013-04-12 NOTE — Telephone Encounter (Signed)
Relevant patient education mailed to patient.  

## 2013-06-23 ENCOUNTER — Other Ambulatory Visit: Payer: Self-pay | Admitting: *Deleted

## 2013-06-23 MED ORDER — CLONAZEPAM 0.5 MG PO TABS: 0.5000 mg | ORAL_TABLET | Freq: Three times a day (TID) | ORAL | Status: DC | PRN

## 2013-06-23 NOTE — Telephone Encounter (Signed)
rx called into pharmacy

## 2013-06-23 NOTE — Telephone Encounter (Signed)
Assisted living patient Geronimo, Please send back to me for call in

## 2013-06-29 ENCOUNTER — Encounter: Payer: Self-pay | Admitting: Internal Medicine

## 2013-06-29 ENCOUNTER — Non-Acute Institutional Stay: Payer: Medicare Other | Admitting: Internal Medicine

## 2013-06-29 VITALS — BP 100/60 | HR 66 | Temp 97.4°F | Resp 16 | Wt 196.0 lb

## 2013-06-29 DIAGNOSIS — D61818 Other pancytopenia: Secondary | ICD-10-CM

## 2013-06-29 DIAGNOSIS — E1149 Type 2 diabetes mellitus with other diabetic neurological complication: Secondary | ICD-10-CM

## 2013-06-29 DIAGNOSIS — M5126 Other intervertebral disc displacement, lumbar region: Secondary | ICD-10-CM

## 2013-06-29 DIAGNOSIS — I10 Essential (primary) hypertension: Secondary | ICD-10-CM

## 2013-06-29 DIAGNOSIS — M5116 Intervertebral disc disorders with radiculopathy, lumbar region: Secondary | ICD-10-CM

## 2013-06-29 DIAGNOSIS — F323 Major depressive disorder, single episode, severe with psychotic features: Secondary | ICD-10-CM

## 2013-06-29 DIAGNOSIS — K439 Ventral hernia without obstruction or gangrene: Secondary | ICD-10-CM

## 2013-06-29 DIAGNOSIS — E1142 Type 2 diabetes mellitus with diabetic polyneuropathy: Secondary | ICD-10-CM

## 2013-06-29 NOTE — Assessment & Plan Note (Signed)
Lab Results  Component Value Date   HGBA1C 6.1* 12/15/2012   Due for labs  Will order

## 2013-06-29 NOTE — Assessment & Plan Note (Signed)
BP Readings from Last 3 Encounters:  06/29/13 100/60  03/23/13 112/64  02/28/13 110/64   Good control

## 2013-06-29 NOTE — Assessment & Plan Note (Signed)
Will recheck labs Hasn't been going to hematologist (and not needed unless sig change)

## 2013-06-29 NOTE — Assessment & Plan Note (Signed)
Controlled with fluoxetine and risperdal (for psychosis and as adjunctive Rx)

## 2013-06-29 NOTE — Assessment & Plan Note (Signed)
This bothers her but I told her it is probably not a good idea to fix Large and no risk for incarceration

## 2013-06-29 NOTE — Assessment & Plan Note (Signed)
No sig pain No Rx needed

## 2013-06-29 NOTE — Progress Notes (Signed)
Subjective:    Patient ID: Veronica Stafford, female    DOB: 08-24-1928, 80 y.o.   MRN: [id number]  HPI Reviewed status with Veronica Ro RN  Had right calf lesion removed about 2 months ago Still hasn't healed Was cancer Has been following with Dr Nehemiah Massed  Concerned about increasing size of umbilical hernia No pain there but does not pain in groin Bowels are slow---every 3-4 days Doesn't strain ---takes miralax regularly No nausea or vomiting  No recent hallucinations Doesn't that her depression is coming back Reads and watches TV---enjoys that Does participate with many of the activities  Haven't been checking sugars Will start weekly No apparent hypoglycemia No feet pain but they remain numb No foot sores  Current Outpatient Prescriptions on File Prior to Visit  Medication Sig Dispense Refill  . allopurinol (ZYLOPRIM) 300 MG tablet Take 300 mg by mouth daily.        Marland Kitchen atenolol (TENORMIN) 25 MG tablet Take 25 mg by mouth daily.        . clonazePAM (KLONOPIN) 0.5 MG tablet Take 1 tablet (0.5 mg total) by mouth 3 (three) times daily as needed for anxiety.  90 tablet  5  . cyanocobalamin 1000 MCG tablet Take 100 mcg by mouth daily.      . ergocalciferol (VITAMIN D2) 50000 UNITS capsule Take 50,000 Units by mouth every 30 (thirty) days.        Marland Kitchen FLUoxetine (PROZAC) 40 MG capsule Take 1 capsule (40 mg total) by mouth daily.  30 capsule  11  . furosemide (LASIX) 80 MG tablet Take 80 mg by mouth daily.        Marland Kitchen gabapentin (NEURONTIN) 300 MG capsule Take 300 mg by mouth 3 (three) times daily.        Marland Kitchen glipiZIDE (GLUCOTROL) 5 MG tablet Take 5 mg by mouth daily.        Marland Kitchen ibuprofen (ADVIL,MOTRIN) 800 MG tablet Take 800 mg by mouth 3 (three) times daily as needed for pain.      . meclizine (ANTIVERT) 12.5 MG tablet Take 12.5 mg by mouth 3 (three) times daily as needed.      . Multiple Vitamins-Minerals (MULTIVITAMIN WITH MINERALS) tablet Take 1 tablet by mouth 2 (two) times daily.       Marland Kitchen  omeprazole (PRILOSEC) 20 MG capsule TAKE ONE CAPSULE BY MOUTH TWICE A DAY.  60 capsule  11  . oxyCODONE-acetaminophen (PERCOCET/ROXICET) 5-325 MG per tablet Take 1 tablet by mouth every 4 (four) hours as needed for pain.      . polyethylene glycol (MIRALAX / GLYCOLAX) packet Take 17 g by mouth daily.        . risperiDONE (RISPERDAL) 1 MG tablet Take 2 tablets (2 mg total) by mouth at bedtime.  30 tablet  11  . sennosides-docusate sodium (SENOKOT-S) 8.6-50 MG tablet Take 1 tablet by mouth daily.      . traMADol (ULTRAM) 50 MG tablet Take 2 tablets (100 mg total) by mouth 3 (three) times daily.  180 tablet  1  . zolpidem (AMBIEN CR) 12.5 MG CR tablet Take 1 tablet (12.5 mg total) by mouth at bedtime as needed.  30 tablet  5   No current facility-administered medications on file prior to visit.    Allergies  Allergen Reactions  . Codeine   . Demerol   . Erythromycin   . Fentanyl   . Flagyl [Metronidazole Hcl]   . Morphine And Related     Past Medical  History  Diagnosis Date  . NASH (nonalcoholic steatohepatitis)   . Vitamin B12 deficiency   . Psychosis     paranoid hallucinations  . Pancytopenia     Dr Danie Chandler infusions  . Arthritis   . Anxiety   . Hypertension   . GERD (gastroesophageal reflux disease)   . Diabetes mellitus   . Osteoporosis   . Depression   . Lumbar disc disease   . Wrist fracture, left 9/12    Past Surgical History  Procedure Laterality Date  . Joint replacement      TKR bilateral  . Elbow surgery      4 on each  . Mandible surgery      twice  . Abdominal hysterectomy    . Cholecystectomy    . Breast surgery      breast reduction  . Hemorrhoid surgery    . Right ear lesion removal    . Fixation kyphoplasty  1/12  . Cataract extraction w/ intraocular lens  implant, bilateral    . Revision of tkr  9/13    Cartilage replaced    Family History  Problem Relation Age of Onset  . Cancer Mother     colon  . Heart disease Father      History   Social History  . Marital Status: Widowed    Spouse Name: N/A    Number of Children: N/A  . Years of Education: N/A   Occupational History  . Nurse     retired   Social History Main Topics  . Smoking status: Never Smoker   . Smokeless tobacco: Never Used  . Alcohol Use: No  . Drug Use: Not on file  . Sexual Activity: Not on file   Other Topics Concern  . Not on file   Social History Narrative   1 son nearby--he is to make medical decisions   2 daughters   Has DNR   Would not accept tube feeds   Review of Systems Appetite is "not so good" Weight stable Not sleeping that great--ambien does help (has been using it nightly)     Objective:   Physical Exam  Constitutional: She appears well-developed and well-nourished. No distress.  Neck: Normal range of motion. Neck supple. No thyromegaly present.  Cardiovascular: Normal rate, regular rhythm, normal heart sounds and intact distal pulses.  Exam reveals no gallop.   No murmur heard. Pulmonary/Chest: Effort normal and breath sounds normal. No respiratory distress. She has no wheezes. She has no rales.  Abdominal: Soft. There is no tenderness.  Musculoskeletal: She exhibits no edema and no tenderness.  Lymphadenopathy:    She has no cervical adenopathy.  Neurological:  Decreased sensation in feet Slight right plantar callous  Skin:  No foot lesions Right anterior calf lesion has "proud flesh" Thumbnails trimmed  Psychiatric:  No overt depression Normal appearance and speech Slightly constricted affect          Assessment & Plan:

## 2013-06-29 NOTE — Assessment & Plan Note (Signed)
Pain has been controlled

## 2013-07-07 LAB — HM DIABETES EYE EXAM

## 2013-08-14 LAB — HEMOGLOBIN A1C: HEMOGLOBIN A1C: 6 % (ref 4.0–6.0)

## 2013-08-14 LAB — LIPID PANEL: LDL Cholesterol: 98 mg/dL

## 2013-08-15 ENCOUNTER — Telehealth: Payer: Self-pay | Admitting: Internal Medicine

## 2013-08-15 NOTE — Telephone Encounter (Signed)
Asks about the borderline elevated TSH No action needed

## 2013-08-15 NOTE — Telephone Encounter (Signed)
Veronica Stafford w/[facility] has some questions about a lab order that Dr. Silvio Pate just emailed her in ref to pt. Could you please call Veronica Stafford at your earliest convenience [phone]. Thank you.

## 2013-08-23 ENCOUNTER — Encounter: Payer: Self-pay | Admitting: Internal Medicine

## 2013-10-26 ENCOUNTER — Non-Acute Institutional Stay: Payer: Medicare Other | Admitting: Internal Medicine

## 2013-10-26 ENCOUNTER — Encounter: Payer: Self-pay | Admitting: Internal Medicine

## 2013-10-26 VITALS — BP 128/68 | HR 74 | Temp 97.0°F | Resp 16 | Wt 193.2 lb

## 2013-10-26 DIAGNOSIS — K439 Ventral hernia without obstruction or gangrene: Secondary | ICD-10-CM

## 2013-10-26 DIAGNOSIS — M5126 Other intervertebral disc displacement, lumbar region: Secondary | ICD-10-CM

## 2013-10-26 DIAGNOSIS — K219 Gastro-esophageal reflux disease without esophagitis: Secondary | ICD-10-CM

## 2013-10-26 DIAGNOSIS — D61818 Other pancytopenia: Secondary | ICD-10-CM

## 2013-10-26 DIAGNOSIS — E1149 Type 2 diabetes mellitus with other diabetic neurological complication: Secondary | ICD-10-CM

## 2013-10-26 DIAGNOSIS — I10 Essential (primary) hypertension: Secondary | ICD-10-CM

## 2013-10-26 DIAGNOSIS — M5116 Intervertebral disc disorders with radiculopathy, lumbar region: Secondary | ICD-10-CM

## 2013-10-26 DIAGNOSIS — E1142 Type 2 diabetes mellitus with diabetic polyneuropathy: Secondary | ICD-10-CM

## 2013-10-26 DIAGNOSIS — F333 Major depressive disorder, recurrent, severe with psychotic symptoms: Secondary | ICD-10-CM

## 2013-10-26 DIAGNOSIS — K7689 Other specified diseases of liver: Secondary | ICD-10-CM

## 2013-10-26 DIAGNOSIS — K7581 Nonalcoholic steatohepatitis (NASH): Secondary | ICD-10-CM

## 2013-10-26 LAB — HM DIABETES FOOT EXAM

## 2013-10-26 NOTE — Assessment & Plan Note (Signed)
BP Readings from Last 3 Encounters:  10/26/13 128/68  06/29/13 100/60  03/23/13 112/64   Good control No changes needed

## 2013-10-26 NOTE — Assessment & Plan Note (Signed)
Reassured--no action needed

## 2013-10-26 NOTE — Assessment & Plan Note (Signed)
Recent numbers fairly stable No action needed

## 2013-10-26 NOTE — Assessment & Plan Note (Signed)
No evidence of cirrhosis

## 2013-10-26 NOTE — Assessment & Plan Note (Signed)
Mild reflux symptoms--not bad enough to increase PPI

## 2013-10-26 NOTE — Progress Notes (Signed)
Subjective:    Patient ID: Veronica Stafford, female    DOB: March 20, 1928, 80 y.o.   MRN: [id number]  HPI Reviewed status with Joellen Jersey, RN  Still concerned about her abdominal hernia Saw surgeon who confirmed ventral hernia but didn't recommend surgery She has trouble "with food coming back up" Some acid reflux--thinks the prilosec helps Chronic constipation--goes every 3 days with miralax  Sugars are fine No hypogylcemic spells Still with tingling and numbness in feet No significant pain and no ulcers  Depression is controlled Limited in walking--gets down to meals with rollator Does enjoy the community meals but stays to herself otherwise Frequent family visits No hallucinations  Ongoing back pain Has reasonable relief with current regimen No sedation or other problems with the meds (other than the constipation)  Current Outpatient Prescriptions on File Prior to Visit  Medication Sig Dispense Refill  . allopurinol (ZYLOPRIM) 300 MG tablet Take 300 mg by mouth daily.        Marland Kitchen atenolol (TENORMIN) 25 MG tablet Take 25 mg by mouth daily.        . clonazePAM (KLONOPIN) 0.5 MG tablet Take 1 tablet (0.5 mg total) by mouth 3 (three) times daily as needed for anxiety.  90 tablet  5  . cyanocobalamin 1000 MCG tablet Take 100 mcg by mouth daily.      . ergocalciferol (VITAMIN D2) 50000 UNITS capsule Take 50,000 Units by mouth every 30 (thirty) days.        Marland Kitchen FLUoxetine (PROZAC) 40 MG capsule Take 1 capsule (40 mg total) by mouth daily.  30 capsule  11  . furosemide (LASIX) 80 MG tablet Take 80 mg by mouth daily.        Marland Kitchen gabapentin (NEURONTIN) 300 MG capsule Take 300 mg by mouth 3 (three) times daily.        Marland Kitchen glipiZIDE (GLUCOTROL) 5 MG tablet Take 5 mg by mouth daily.        Marland Kitchen ibuprofen (ADVIL,MOTRIN) 800 MG tablet Take 800 mg by mouth 3 (three) times daily as needed for pain.      . meclizine (ANTIVERT) 12.5 MG tablet Take 12.5 mg by mouth 3 (three) times daily as needed.      . Multiple  Vitamins-Minerals (MULTIVITAMIN WITH MINERALS) tablet Take 1 tablet by mouth daily.       Marland Kitchen omeprazole (PRILOSEC) 20 MG capsule TAKE ONE CAPSULE BY MOUTH TWICE A DAY.  60 capsule  11  . polyethylene glycol (MIRALAX / GLYCOLAX) packet Take 17 g by mouth daily.        . risperiDONE (RISPERDAL) 1 MG tablet Take 2 tablets (2 mg total) by mouth at bedtime.  30 tablet  11  . sennosides-docusate sodium (SENOKOT-S) 8.6-50 MG tablet Take 2 tablets by mouth 2 (two) times daily.       . traMADol (ULTRAM) 50 MG tablet Take 2 tablets (100 mg total) by mouth 3 (three) times daily.  180 tablet  1   No current facility-administered medications on file prior to visit.    Allergies  Allergen Reactions  . Codeine   . Demerol   . Erythromycin   . Fentanyl   . Flagyl [Metronidazole Hcl]   . Morphine And Related     Past Medical History  Diagnosis Date  . NASH (nonalcoholic steatohepatitis)   . Vitamin B12 deficiency   . Psychosis     paranoid hallucinations  . Pancytopenia     Dr Danie Chandler infusions  . Arthritis   .  Anxiety   . Hypertension   . GERD (gastroesophageal reflux disease)   . Diabetes mellitus   . Osteoporosis   . Depression   . Lumbar disc disease   . Wrist fracture, left 9/12    Past Surgical History  Procedure Laterality Date  . Joint replacement      TKR bilateral  . Elbow surgery      4 on each  . Mandible surgery      twice  . Abdominal hysterectomy    . Cholecystectomy    . Breast surgery      breast reduction  . Hemorrhoid surgery    . Right ear lesion removal    . Fixation kyphoplasty  1/12  . Cataract extraction w/ intraocular lens  implant, bilateral    . Revision of tkr  9/13    Cartilage replaced    Family History  Problem Relation Age of Onset  . Cancer Mother     colon  . Heart disease Father     History   Social History  . Marital Status: Widowed    Spouse Name: N/A    Number of Children: N/A  . Years of Education: N/A   Occupational  History  . Nurse     retired   Social History Main Topics  . Smoking status: Never Smoker   . Smokeless tobacco: Never Used  . Alcohol Use: No  . Drug Use: Not on file  . Sexual Activity: Not on file   Other Topics Concern  . Not on file   Social History Narrative   1 son nearby--he is to make medical decisions   2 daughters   Has DNR   Would not accept tube feeds   Review of Systems Sleeps well Appetite is fine Weight is fairly stable---down a few pounds. Voids fine Recent visit to derm---another lesion frozen Mycotic thumb nails big again     Objective:   Physical Exam  Constitutional: She appears well-developed and well-nourished. No distress.  Neck: Normal range of motion. Neck supple. No thyromegaly present.  Cardiovascular: Normal rate, regular rhythm, normal heart sounds and intact distal pulses.  Exam reveals no gallop.   No murmur heard. Pulmonary/Chest: Effort normal and breath sounds normal. No respiratory distress. She has no wheezes. She has no rales.  Abdominal: Soft. She exhibits no distension. There is no tenderness.  Musculoskeletal: She exhibits no edema and no tenderness.  Lymphadenopathy:    She has no cervical adenopathy.  Neurological:  Decreased sensation in feet  Skin: No rash noted. No erythema.  No foot ulcers Mycotic toenails (has podiatry visit) Mycotic thumbnails filed with electric manicurist's kit--- smoothly down  Psychiatric: She has a normal mood and affect. Her behavior is normal.          Assessment & Plan:

## 2013-10-26 NOTE — Assessment & Plan Note (Signed)
Good control  No changes needed

## 2013-10-26 NOTE — Assessment & Plan Note (Signed)
No sig pain--so no Rx

## 2013-10-26 NOTE — Assessment & Plan Note (Signed)
Fair control on her current regimen Will increase the senna for the constipation

## 2013-10-26 NOTE — Assessment & Plan Note (Signed)
In remission with fluoxetine and adjunctive rx with risperdal Wean attempts contraindicated

## 2014-01-08 ENCOUNTER — Other Ambulatory Visit: Payer: Self-pay | Admitting: Internal Medicine

## 2014-01-08 NOTE — Telephone Encounter (Signed)
Ok to fill 

## 2014-01-08 NOTE — Telephone Encounter (Signed)
rx sent to pharmacy by e-script  

## 2014-01-08 NOTE — Telephone Encounter (Signed)
Fill all for a year please

## 2014-01-15 LAB — HEMOGLOBIN A1C: HEMOGLOBIN A1C: 6.1 % — AB (ref 4.0–6.0)

## 2014-01-16 ENCOUNTER — Encounter: Payer: Self-pay | Admitting: Internal Medicine

## 2014-01-25 ENCOUNTER — Non-Acute Institutional Stay: Payer: Medicare Other | Admitting: Internal Medicine

## 2014-01-25 ENCOUNTER — Encounter: Payer: Self-pay | Admitting: Internal Medicine

## 2014-01-25 VITALS — BP 118/60 | HR 70 | Temp 98.1°F | Resp 16 | Wt 201.0 lb

## 2014-01-25 DIAGNOSIS — D61818 Other pancytopenia: Secondary | ICD-10-CM

## 2014-01-25 DIAGNOSIS — E1149 Type 2 diabetes mellitus with other diabetic neurological complication: Secondary | ICD-10-CM

## 2014-01-25 DIAGNOSIS — M5116 Intervertebral disc disorders with radiculopathy, lumbar region: Secondary | ICD-10-CM

## 2014-01-25 DIAGNOSIS — E1142 Type 2 diabetes mellitus with diabetic polyneuropathy: Secondary | ICD-10-CM

## 2014-01-25 DIAGNOSIS — F333 Major depressive disorder, recurrent, severe with psychotic symptoms: Secondary | ICD-10-CM

## 2014-01-25 DIAGNOSIS — E1140 Type 2 diabetes mellitus with diabetic neuropathy, unspecified: Secondary | ICD-10-CM

## 2014-01-25 DIAGNOSIS — K7581 Nonalcoholic steatohepatitis (NASH): Secondary | ICD-10-CM

## 2014-01-25 LAB — HM DIABETES FOOT EXAM

## 2014-01-25 NOTE — Assessment & Plan Note (Signed)
No apparent symptoms 

## 2014-01-25 NOTE — Assessment & Plan Note (Signed)
Lab Results  Component Value Date   HGBA1C 6.1* 01/15/2014   Great control without hypoglycemia No changes needed

## 2014-01-25 NOTE — Assessment & Plan Note (Signed)
Still in remission with her treatment Weaning meds is not appropriate

## 2014-01-25 NOTE — Assessment & Plan Note (Signed)
Doing okay with the tramadol 

## 2014-01-25 NOTE — Assessment & Plan Note (Signed)
Numbness without sig pain No Rx changes needed

## 2014-01-25 NOTE — Assessment & Plan Note (Signed)
Chronic and stable No rx for now

## 2014-01-25 NOTE — Progress Notes (Signed)
Subjective:    Patient ID: Veronica Stafford, female    DOB: [birth year], 80 y.o.   MRN: [id number]  HPI Reviewed status with [facility] RN Doing well and no new concerns She is satisfied  Sugars have been fine Recent A1c 6.1% Appetite fair with stable weight Feet still numb but no ulcers or sig pain  Mood is fine No depression of late No hallucinations No other thought process disturbances  Occasional heartburn despite the medicine No swallowing problems  Back and knee pain Feels the tramadol does a reasonable job  Current Outpatient Prescriptions on File Prior to Visit  Medication Sig Dispense Refill  . allopurinol (ZYLOPRIM) 300 MG tablet TAKE 1 TABLET BY MOUTH ONCE DAILY FOR GOUT 30 tablet 11  . atenolol (TENORMIN) 25 MG tablet Take 25 mg by mouth daily.      . clonazePAM (KLONOPIN) 0.5 MG tablet Take 1 tablet (0.5 mg total) by mouth 3 (three) times daily as needed for anxiety. 90 tablet 5  . cyanocobalamin 1000 MCG tablet Take 100 mcg by mouth daily.    . ergocalciferol (VITAMIN D2) 50000 UNITS capsule Take 50,000 Units by mouth every 30 (thirty) days.      Marland Kitchen FLUoxetine (PROZAC) 40 MG capsule Take 1 capsule (40 mg total) by mouth daily. 30 capsule 11  . furosemide (LASIX) 80 MG tablet Take 80 mg by mouth daily.      Marland Kitchen gabapentin (NEURONTIN) 300 MG capsule Take 1 capsule (300 mg total) by mouth 3 (three) times daily. Dx: E11.42 90 capsule 11  . glipiZIDE (GLUCOTROL) 5 MG tablet Take 5 mg by mouth daily.      Marland Kitchen ibuprofen (ADVIL,MOTRIN) 800 MG tablet Take 800 mg by mouth 3 (three) times daily as needed for pain.    . meclizine (ANTIVERT) 12.5 MG tablet Take 12.5 mg by mouth 3 (three) times daily as needed.    . Multiple Vitamins-Minerals (MULTIVITAMIN WITH MINERALS) tablet Take 1 tablet by mouth daily.     Marland Kitchen omeprazole (PRILOSEC) 20 MG capsule TAKE ONE CAPSULE BY MOUTH TWICE A DAY. *DO NOT CRUSH* 60 capsule 11  . polyethylene glycol (MIRALAX / GLYCOLAX) packet Take 17 g by mouth  daily.      . risperiDONE (RISPERDAL) 1 MG tablet Take 2 tablets (2 mg total) by mouth at bedtime. 30 tablet 11  . sennosides-docusate sodium (SENOKOT-S) 8.6-50 MG tablet Take 2 tablets by mouth 2 (two) times daily.     . traMADol (ULTRAM) 50 MG tablet Take 2 tablets (100 mg total) by mouth 3 (three) times daily. 180 tablet 1  . zolpidem (AMBIEN) 10 MG tablet Take 10 mg by mouth at bedtime as needed for sleep.     No current facility-administered medications on file prior to visit.    Allergies  Allergen Reactions  . Codeine   . Demerol   . Erythromycin   . Fentanyl   . Flagyl [Metronidazole Hcl]   . Morphine And Related     Past Medical History  Diagnosis Date  . NASH (nonalcoholic steatohepatitis)   . Vitamin B12 deficiency   . Psychosis     paranoid hallucinations  . Pancytopenia     Dr Danie Chandler infusions  . Arthritis   . Anxiety   . Hypertension   . GERD (gastroesophageal reflux disease)   . Diabetes mellitus   . Osteoporosis   . Depression   . Lumbar disc disease   . Wrist fracture, left 9/12    Past Surgical  History  Procedure Laterality Date  . Joint replacement      TKR bilateral  . Elbow surgery      4 on each  . Mandible surgery      twice  . Abdominal hysterectomy    . Cholecystectomy    . Breast surgery      breast reduction  . Hemorrhoid surgery    . Right ear lesion removal    . Fixation kyphoplasty  1/12  . Cataract extraction w/ intraocular lens  implant, bilateral    . Revision of tkr  9/13    Cartilage replaced    Family History  Problem Relation Age of Onset  . Cancer Mother     colon  . Heart disease Father     History   Social History  . Marital Status: Widowed    Spouse Name: N/A    Number of Children: N/A  . Years of Education: N/A   Occupational History  . Nurse     retired   Social History Main Topics  . Smoking status: Never Smoker   . Smokeless tobacco: Never Used  . Alcohol Use: No  . Drug Use: Not on  file  . Sexual Activity: Not on file   Other Topics Concern  . Not on file   Social History Narrative   1 son nearby--he is to make medical decisions   2 daughters   Has DNR   Would not accept tube feeds   Review of Systems Sleeps very well Bowels are still loose--she relates to her hernia and she has lots of gas Voids okay but some incontinence (but hasn't needed a pad)    Objective:   Physical Exam  Constitutional: She appears well-developed and well-nourished. No distress.  Neck: Normal range of motion. Neck supple. No thyromegaly present.  Cardiovascular: Normal rate, regular rhythm, normal heart sounds and intact distal pulses.  Exam reveals no gallop.   No murmur heard. Pulmonary/Chest: Effort normal and breath sounds normal. No respiratory distress. She has no wheezes. She has no rales.  Abdominal: Soft. She exhibits no distension. There is no rebound.  Mild periumbilical tenderness  Musculoskeletal: She exhibits no edema or tenderness.  Lymphadenopathy:    She has no cervical adenopathy.  Skin:  Mycotic toenails and thumbnails debrided with nail tool  Psychiatric: She has a normal mood and affect. Her behavior is normal.          Assessment & Plan:

## 2014-03-29 ENCOUNTER — Non-Acute Institutional Stay: Payer: Medicare Other | Admitting: Internal Medicine

## 2014-03-29 ENCOUNTER — Encounter: Payer: Self-pay | Admitting: Internal Medicine

## 2014-03-29 VITALS — BP 118/64 | HR 62 | Temp 96.9°F | Resp 16 | Wt 198.0 lb

## 2014-03-29 DIAGNOSIS — K219 Gastro-esophageal reflux disease without esophagitis: Secondary | ICD-10-CM

## 2014-03-29 DIAGNOSIS — F333 Major depressive disorder, recurrent, severe with psychotic symptoms: Secondary | ICD-10-CM

## 2014-03-29 DIAGNOSIS — R1084 Generalized abdominal pain: Secondary | ICD-10-CM

## 2014-03-29 DIAGNOSIS — E1142 Type 2 diabetes mellitus with diabetic polyneuropathy: Secondary | ICD-10-CM

## 2014-03-29 NOTE — Progress Notes (Signed)
Subjective:    Patient ID: Veronica Stafford, female    DOB: [birth year], 81 y.o.   MRN: [id number]  HPI The hernia "has overtaken my life"  Has gotten bigger She can't eat and thinks it is affecting her breathing Remembers going to surgeon---didn't recommend Rx  Notes pain in left groin  Ongoing acid reflux--- med controls "to a certain extent"  Doesn't feel she is depressed No hallucinations   Has trouble with bowels Gets gassy and swells Will go every 3 days or so---then has blow out and has to clean the whole bowl  Current Outpatient Prescriptions on File Prior to Visit  Medication Sig Dispense Refill  . allopurinol (ZYLOPRIM) 300 MG tablet TAKE 1 TABLET BY MOUTH ONCE DAILY FOR GOUT 30 tablet 11  . atenolol (TENORMIN) 25 MG tablet Take 25 mg by mouth daily.      . clonazePAM (KLONOPIN) 0.5 MG tablet Take 1 tablet (0.5 mg total) by mouth 3 (three) times daily as needed for anxiety. 90 tablet 5  . cyanocobalamin 1000 MCG tablet Take 100 mcg by mouth daily.    . ergocalciferol (VITAMIN D2) 50000 UNITS capsule Take 50,000 Units by mouth every 30 (thirty) days.      Marland Kitchen FLUoxetine (PROZAC) 40 MG capsule Take 1 capsule (40 mg total) by mouth daily. 30 capsule 11  . furosemide (LASIX) 80 MG tablet Take 80 mg by mouth daily.      Marland Kitchen gabapentin (NEURONTIN) 300 MG capsule Take 1 capsule (300 mg total) by mouth 3 (three) times daily. Dx: E11.42 90 capsule 11  . glipiZIDE (GLUCOTROL) 5 MG tablet Take 5 mg by mouth daily.      Marland Kitchen ibuprofen (ADVIL,MOTRIN) 800 MG tablet Take 800 mg by mouth 3 (three) times daily as needed for pain.    . meclizine (ANTIVERT) 12.5 MG tablet Take 12.5 mg by mouth 3 (three) times daily as needed.    . Multiple Vitamins-Minerals (MULTIVITAMIN WITH MINERALS) tablet Take 1 tablet by mouth daily.     Marland Kitchen omeprazole (PRILOSEC) 20 MG capsule TAKE ONE CAPSULE BY MOUTH TWICE A DAY. *DO NOT CRUSH* 60 capsule 11  . polyethylene glycol (MIRALAX / GLYCOLAX) packet Take 17 g by mouth  daily.      . risperiDONE (RISPERDAL) 1 MG tablet Take 2 tablets (2 mg total) by mouth at bedtime. 30 tablet 11  . sennosides-docusate sodium (SENOKOT-S) 8.6-50 MG tablet Take 2 tablets by mouth 2 (two) times daily.     . traMADol (ULTRAM) 50 MG tablet Take 2 tablets (100 mg total) by mouth 3 (three) times daily. 180 tablet 1  . zolpidem (AMBIEN) 10 MG tablet Take 10 mg by mouth at bedtime as needed for sleep.     No current facility-administered medications on file prior to visit.    Allergies  Allergen Reactions  . Codeine   . Demerol   . Erythromycin   . Fentanyl   . Flagyl [Metronidazole Hcl]   . Morphine And Related     Past Medical History  Diagnosis Date  . NASH (nonalcoholic steatohepatitis)   . Vitamin B12 deficiency   . Psychosis     paranoid hallucinations  . Pancytopenia     Dr Danie Chandler infusions  . Arthritis   . Anxiety   . Hypertension   . GERD (gastroesophageal reflux disease)   . Diabetes mellitus   . Osteoporosis   . Depression   . Lumbar disc disease   . Wrist fracture, left 9/12  Past Surgical History  Procedure Laterality Date  . Joint replacement      TKR bilateral  . Elbow surgery      4 on each  . Mandible surgery      twice  . Abdominal hysterectomy    . Cholecystectomy    . Breast surgery      breast reduction  . Hemorrhoid surgery    . Right ear lesion removal    . Fixation kyphoplasty  1/12  . Cataract extraction w/ intraocular lens  implant, bilateral    . Revision of tkr  9/13    Cartilage replaced    Family History  Problem Relation Age of Onset  . Cancer Mother     colon  . Heart disease Father     History   Social History  . Marital Status: Widowed    Spouse Name: N/A    Number of Children: N/A  . Years of Education: N/A   Occupational History  . Nurse     retired   Social History Main Topics  . Smoking status: Never Smoker   . Smokeless tobacco: Never Used  . Alcohol Use: No  . Drug Use: Not on  file  . Sexual Activity: Not on file   Other Topics Concern  . Not on file   Social History Narrative   1 son nearby--he is to make medical decisions   2 daughters   Has DNR   Would not accept tube feeds   Review of Systems Sleeps okay Weight is fairly stable    Objective:   Physical Exam  Constitutional: She appears well-developed and well-nourished. No distress.  Neck: Normal range of motion. Neck supple. No thyromegaly present.  Cardiovascular: Normal rate, regular rhythm and normal heart sounds.  Exam reveals no gallop.   No murmur heard. Pulmonary/Chest: Effort normal and breath sounds normal. No respiratory distress. She has no wheezes. She has no rales.  Abdominal: Soft. Bowel sounds are normal. She exhibits distension. She exhibits no mass. There is tenderness. There is no rebound and no guarding.  Lymphadenopathy:    She has no cervical adenopathy.  Psychiatric:  Mildly anxious about situation          Assessment & Plan:

## 2014-03-29 NOTE — Assessment & Plan Note (Signed)
Her constipation may be element of gastroparesis ---part of he diabetic neuropathy If the dulcolax doesn't help, would consider trial of low dose metoclopramide (but hesitantly with her psychosis history)

## 2014-03-29 NOTE — Assessment & Plan Note (Signed)
Still with some symptoms despite PPI Hopefully should improve with more regular BMs

## 2014-03-29 NOTE — Assessment & Plan Note (Signed)
I worried that her obsession with the hernia signaled a recurrence of her psychosis---but it doesn't seem to be the case Will just continue the gabapentin and risperidone

## 2014-03-29 NOTE — Assessment & Plan Note (Signed)
She has related this to her ventral hernia I don't think that is the problem Most likely, she has obstipation and then gets gassy, distended (and that could affect breathing and appetite, etc-----unlike a ventral hernia) I will increase the miralax to bid and add dulcolax every 2nd day if she hasn't gone (to see if that helps) Continue the senna

## 2014-06-20 ENCOUNTER — Telehealth: Payer: Self-pay | Admitting: Internal Medicine

## 2014-06-20 NOTE — Telephone Encounter (Signed)
Notified by [name] RN that her sugars are running low since she is eating less with teeth problems Is seeing oral surgeon Did have sig hypoglycemic spell  Last A1c 6.1% Will stop the glipizide

## 2014-06-22 LAB — HM DIABETES EYE EXAM

## 2014-06-26 ENCOUNTER — Encounter: Payer: Self-pay | Admitting: Internal Medicine

## 2014-06-26 NOTE — Consult Note (Signed)
Patient seen, see dictation for details. Briefly, patient with known h/o chronic pancytopenia secondary to history of cirrhosis, anemia of iron deficiency. Recent iron levels were in normal range. Currently s/p knee revision surgery. CBC shows platelets count in 40s. No bleeding symptoms clinically. Anemia slightly worsened than baseline likely post-surgical drop. Continue to monitor Hemoglobin and platelets and transfuse as indicated. Will continue to follow as indicated by blood counts.   Electronic Signatures: Jonn Shingles (MD)  (Signed on 15-Sep-13 01:14)  Authored  Last Updated: 15-Sep-13 01:14 by Jonn Shingles (MD)

## 2014-06-26 NOTE — Consult Note (Signed)
HEMATOLOGY followup note - resting, states she ambulated slowly around nursing station x1, denies progressive fatigue or dyspnea.no fevers.  No bleeding symptoms.alert and oriented, in no acute distress.  Pallor present           vitals - 98.7, 76, 18, 114/65, 97% on room air           lungs - bilateral good air entry           abdomen - soft, nontenderhemoglobin 8.7, platelets 45  Known h/o chronic pancytopenia secondary to history of cirrhosis, anemia of iron deficiency. Recent iron levels were in normal range. Currently s/p knee revision surgery.  Repeat blood counts today shows platelet count stable at 45, no bleeding symptoms clinically.  Hemoglobin still low at 8.7 but stable, likely post-surgical drop. Continue to monitor Hemoglobin and platelets intermittently and transfuse as indicated. Will continue to follow as indicated by blood counts.     Electronic Signatures: Jonn Shingles (MD)  (Signed on 15-Sep-13 12:57)  Authored  Last Updated: 15-Sep-13 12:57 by Jonn Shingles (MD)

## 2014-06-26 NOTE — Op Note (Signed)
PATIENT NAME:  Veronica Stafford, Veronica Stafford MR#:  [id number] DATE OF BIRTH:  [birth year]  DATE OF PROCEDURE:  11/19/2011  PREOPERATIVE DIAGNOSIS: Worn polyethylene component, left total knee.   POSTOPERATIVE DIAGNOSIS: Worn polyethylene component, left total knee.   PROCEDURE: Revision of left tibial polyethylene insert.   SURGEON: Laurene Footman, M.D.   ANESTHESIA: General.  DESCRIPTION OF PROCEDURE: The patient was brought to the Operating Room and after adequate anesthesia was obtained the left leg was prepped and draped in the usual sterile fashion with a tourniquet applied to the upper thigh and Alvarado leg holder utilized. After prepping and draping in the usual sterile manner, appropriate patient identification and time out procedures were completed. Most of the prior incision was made excluding the more proximal end. The parapatellar arthrotomy was then opened. Inspection revealed some synovitis consistent with polyethylene wear with a brownish tinge to this. This was subsequently debrided with use of rongeur in both gutters and the suprapatellar pouch. The knee was examined and the metal appeared to be stable. On probing around the edges of the implant, there was no apparent osteolysis. The tibial polyethylene component was then removed and it was a 9 mm type I Scorpio implant. A 10 mm type II implant, which corresponds to this, was trialed and gave excellent stability through a range of motion. The knee was irrigated first and debridement of the synovium carried out. After thorough irrigation of the joint, the final tibial insert was impacted into place and snapped in well. The wound was then closed with a running heavy quill suture for the capsule, 2-0 quill subcutaneously, and skin staples. Xeroform, 4 x 4's, ABD, Webril, and an Ace wrap were applied. The patient was sent to the recovery room in stable condition.   ESTIMATED BLOOD LOSS: 200 mL.   COMPLICATIONS: None.   SPECIMENS: None.  IMPLANT:  Series II tibial bearing insert size #9 10-mm thickness from [employer] for the Scorpio total knee.  ____________________________ Laurene Footman, MD mjm:slb D: 11/19/2011 20:59:06 ET T: 11/20/2011 11:09:34 ET JOB#: [id number]  cc: Laurene Footman, MD, <Dictator> Laurene Footman MD ELECTRONICALLY SIGNED 11/20/2011 12:41

## 2014-06-26 NOTE — Discharge Summary (Signed)
PATIENT NAME:  Veronica Stafford, Veronica Stafford MR#:  [id number] DATE OF BIRTH:  23-Nov-1928  DATE OF ADMISSION:  11/19/2011 DATE OF DISCHARGE:  11/23/2011   ADMITTING DIAGNOSIS: Worn polyethylene component, left total knee.   DISCHARGE DIAGNOSIS: Worn polyethylene component, left total knee.   PROCEDURE: Revision of left tibial polyethylene insert.   SURGEON: Laurene Footman, MD    ANESTHESIA: General.   ESTIMATED BLOOD LOSS: 200 mL.   COMPLICATIONS: None.   SPECIMENS: None.   IMPLANTS: Series II tibial bearing insert size #9 10 mm thickness from [employer] for the Scorpio total knee.   HISTORY: The patient is an 81 year old who had bilateral knee replacements approximately 15 to 20 years ago in [place]. [employer] rep was contacted and found appropriate implants for these revisions. She has been having difficulty with the knees giving way. She has also had swelling to the knee. She is having difficulty walking, sensation that she is going to fall because of this.   PHYSICAL EXAMINATION: LUNGS: Clear. HEART: Regular rate and rhythm. HEENT: She has full upper and lower dentures. With regard to the left leg, she has full extension, flexion to 115 degrees. There is marked instability to the varus valgus with stress and mid flexion as well as moderate instability in extension. She has trace DP pulse. She is able to flex and extend the toes and ankles. Sensation is intact to the lower extremity. She does have some venostasis changes. Her incision is well healed and midline with no skin lesions.   HOSPITAL COURSE: The patient was admitted to the hospital on 11/19/2011. She had surgery that same day and was brought to the orthopedic floor from the PAC-U in stable condition. Due to chronic low platelets less than 60,000, anticoagulation was contraindicated. On postop day one, the patient's hemoglobin was 10.8 and platelet count was down to 51. On postop day two, platelets were down to 43. On postop day  two, Oncology was consulted for low platelet count. Oncology noted the patient to have history of chronic pancytopenia with history of cirrhosis and iron deficiency anemia. The patient was asymptomatic on exam and was only slightly worse than baseline. They advised Orthopedics to continue to monitor hemoglobin and platelets and transfuse as indicated. On postop day three, platelet count was up to 45 from 43 and hemoglobin was stable at 8.7. The patient on postop day four had progressed well with physical therapy. She was asymptomatic and was ready for discharge home to [facility].   DISCHARGE ORDERS: 1. The patient should wear TED hose, thigh high, on bilateral legs.  2. Activity is weightbearing as tolerated. 3. Incentive spirometry every one hour to prevent atelectasis, cough and deep breathe every two hours.  4. Physical therapy consult to evaluate and treat for difficulty walking, weightbearing as tolerated.  5. OT to evaluate and treat for muscle weakness and assistance with activities of daily living.  6. Regular diet as tolerated.   DISCHARGE MEDICATIONS:  1. Tramadol 50 to 100 mg oral p.o. q.4 hours p.r.n. pain.  2. Dulcolax 10 mg rectally daily p.r.n. constipation. 3. Milk of Magnesia 30 mL oral b.i.d. p.r.n. constipation.  4. Enema soapsuds, frequency daily.  5. Aluminum mag hydroxide with simethicone 400/400/40 per 5 mL suspension 30 mL oral q.6 six hours p.r.n. indigestion or heartburn. 6. Senokot 50/8.6 mg tablet 1 tablet oral b.i.d.  7. Risperidone tablet 2 mg oral at bedtime. 8. Ranitidine tablet 150 mg oral q.12 hours. 9. Fluoxetine capsule 40 mg oral  daily. 10. Polyethylene glycol powder 17 grams oral daily p.r.n. constipation.  11. Meclizine tablet 12.5 mg oral t.i.d. p.r.n. dizziness.  12. Furosemide tablet 80 mg oral daily.  13. Glipizide tablet 5 mg oral a.c. break.  14. Gabapentin capsule 300 mg oral t.i.d.   15. Clonazepam tablet 0.5 mg oral q.6 hours p.r.n. agitation.   16. Atenolol 25 mg oral daily. 17. Allopurinol 300 mg oral daily.  18. Zolpidem CR tablet 12.5 mg oral at bedtime p.r.n. sleep.   DISPOSITION: Discharge to rehab facility. She needs to follow-up with [facility] in 10 days for staple removal and have dressing changes as needed.   ____________________________ Duanne Guess, PA-C tcg:drc D: 11/23/2011 12:02:27 ET T: 11/23/2011 12:10:49 ET JOB#: [id number]  cc: Duanne Guess, PA-C, <Dictator> Duanne Guess [place] ELECTRONICALLY SIGNED 12/08/2011 13:49

## 2014-06-26 NOTE — Consult Note (Signed)
PATIENT NAME:  Veronica Stafford, Veronica Stafford MR#:  [id number] DATE OF BIRTH:  April 19, 1928  DATE OF CONSULTATION:  11/21/2011  REFERRING PHYSICIAN:  Hessie Knows, MD CONSULTING PHYSICIAN:  Santoria Chason R. Ma Hillock, MD  REASON FOR CONSULTATION: Chronic pancytopenia, thrombocytopenia, status post knee revision surgery, platelets down to 43.   HISTORY OF PRESENT ILLNESS: The patient is an 81 year old female with known history of chronic pancytopenia secondary to history of cirrhosis with accompanying splenomegaly/hypersplenism. Bone marrow biopsy in 2008 was mostly unremarkable except for absent iron. The patient also has anemia of iron deficiency and has received parenteral iron therapy in the past, but more recently has been doing well without this treatment. The patient has been admitted to the hospital and underwent left knee revision surgery on 09/12.  CBC on 09/10  showed hemoglobin 11.2, platelets 62, WBC 3300 with ANC 2300. Repeat blood counts from earlier today showed platelet count 43, hemoglobin 8.9. Clinically the patient denies any obvious bleeding symptoms. Oral intake is borderline, same as before. Denies any fevers or chills. Denies any progressive fatigue at rest, dyspnea, chest pain, or dizziness.   PAST MEDICAL HISTORY/PAST SURGICAL HISTORY:  1. History of cirrhosis of the liver.  2. Chronic pancytopenia secondary to cirrhosis and mild splenomegaly.  3. Diabetes.  4. Hypertension.  5. Colon polyps.  6. Anxiety.  7. Osteoarthritis.  8. Gastroesophageal reflux disease.  9. Chronic low back pain status post multiple back surgeries.  10. Prolapsed urethra. 11. History of right neck mass evaluated in 2006. No history of malignancy or lymphoma per patient.  12. Bilateral knee replacements.  13. Back surgery.  14. Shoulder surgery.  15. Elbow surgery.  16. Total abdominal hysterectomy/bilateral salpingo-oophorectomy 1970s.  17. Cholecystectomy.  18. Finger surgery in 2008 by Dr. Mauri Pole.    19. Lower jaw  surgery.   FAMILY HISTORY: Noncontributory.   SOCIAL HISTORY: Denies smoking or alcohol usage.   ALLERGIES: Latex, erythromycin, morphine, Demerol, Darvocet N, Reglan, codeine, fentanyl, Cipro, Tagamet.   HOME MEDICATIONS:  1. Ambien 12.5 mg at bedtime.  2. Atenolol 25 mg daily.  3. Gabapentin 300 mg t.i.d.  4. Allopurinol 300 mg daily.  5. Glipizide 5 mg daily.  6. Clonazepam 0.5 mg t.i.d. p.r.n.  7. Prozac 40 mg daily.  8. Lasix 80 mg daily.  9. Multivitamin 1 daily.  10. Vitamin D2 1.25 mg monthly. 11. B12 1000 mcg intramuscular monthly. 12. Eucerin topical cream b.i.d.  13. MiraLAX 17 grams daily. 14. Risperdal 2 mg at bedtime.  15. Meclizine 12.5 mg t.i.d. p.r.n.  16. Zantac 150 mg b.i.d. p.r.n. 17. Ultram ER 100 mg extended-release 1 tablet t.i.d.  18. Lidoderm patch transdermal twice daily.   REVIEW OF SYSTEMS: CONSTITUTIONAL: As in history of present illness. No fever or chills. HEENT: Currently denies headaches, dizziness, epistaxis, or ear or jaw pain. CARDIAC: Currently denies angina, palpitation, orthopnea, or paroxysmal nocturnal dyspnea. Has chronic dyspnea on exertion. LUNGS: No new cough, sputum, hemoptysis, or chest pain. GI: No nausea, vomiting, or diarrhea. Denies blood in stools or melena. GU: No dysuria or hematuria.  MUSCULOSKELETAL: Has chronic arthritis, denies new bone pains. SKIN: No new rashes, has easy bruising.  NEURO: No new focal weakness, seizures, or loss of consciousness. ENDOCRINE: No polyuria or polydipsia. Appetite borderline.   PHYSICAL EXAMINATION:  GENERAL: The patient is sitting in bed, alert and oriented, no acute distress, converses appropriately. No icterus. Pallor present.   VITAL SIGNS: Temperature 99.8, pulse 84, respirations 18, blood pressure 149/67, 96% on room  air.   HEENT: Normocephalic, atraumatic. Extraocular movements intact. Sclerae anicteric. No oral thrush or petechiae.   NECK: Negative for lymphadenopathy.    CARDIOVASCULAR: S1, S2, regular.   LUNGS: Lungs show bilateral good air entry, decreased at bases.   ABDOMEN: Soft, nontender. No hepatosplenomegaly clinically.   EXTREMITIES: Mild edema. No cyanosis.   SKIN: No major bruising or ecchymosis.   NEUROLOGIC: Cranial nerves are intact, moves all extremities spontaneously.   MUSCULOSKELETAL: No obvious joint deformity or swelling.   LABORATORY, DIAGNOSTIC, AND RADIOLOGICAL DATA: Creatinine 0.95, potassium 3.6, calcium 7.7, hemoglobin 8.9, platelets 43. Iron study from September 10 showed serum iron 74, iron saturation 19%, ferritin 34.   IMPRESSION AND RECOMMENDATIONS: This is an 81 year old female patient with history of multiple medical problems including cirrhosis of the liver with mild splenomegaly/hypersplenism causing chronic pancytopenia, along with history of anemia of iron deficiency. Recent iron levels on 09/10 were in the normal range. Hemoglobin was 11.2 at that time also. Platelet count was 62. She had undergone knee revision surgery two days ago. CBC now shows hemoglobin 8.9 which is likely postsurgical drop, and platelet count is slightly lower at 42,000. Clinically no bleeding symptoms. I do not see the need for transfusion at this time. Continue to monitor hemoglobin and platelets and transfuse as indicated (for platelet count less than 20,000 or at higher counts if major bleeding symptoms, and if hemoglobin drops to 7 or less). We will continue to follow as indicated by blood counts and make further recommendations. The patient was explained the above, agreeable to this plan.   Thank you for the referral. Please feel free to contact me if any additional questions.   ____________________________ Rhett Bannister Ma Hillock, MD srp:bjt D: 11/22/2011 11:43:45 ET T: 11/22/2011 11:52:16 ET JOB#: [id number]  cc: Danijela Vessey R. Ma Hillock, MD, <Dictator> Alveta Heimlich MD ELECTRONICALLY SIGNED 11/23/2011 10:23

## 2014-06-28 ENCOUNTER — Encounter: Payer: Self-pay | Admitting: Internal Medicine

## 2014-06-28 ENCOUNTER — Non-Acute Institutional Stay: Payer: Medicare Other | Admitting: Internal Medicine

## 2014-06-28 VITALS — BP 122/86 | HR 96 | Temp 97.8°F | Resp 20 | Wt 199.0 lb

## 2014-06-28 DIAGNOSIS — K439 Ventral hernia without obstruction or gangrene: Secondary | ICD-10-CM | POA: Diagnosis not present

## 2014-06-28 DIAGNOSIS — M5116 Intervertebral disc disorders with radiculopathy, lumbar region: Secondary | ICD-10-CM | POA: Diagnosis not present

## 2014-06-28 DIAGNOSIS — E1140 Type 2 diabetes mellitus with diabetic neuropathy, unspecified: Secondary | ICD-10-CM | POA: Diagnosis not present

## 2014-06-28 DIAGNOSIS — B351 Tinea unguium: Secondary | ICD-10-CM | POA: Diagnosis not present

## 2014-06-28 DIAGNOSIS — D61818 Other pancytopenia: Secondary | ICD-10-CM

## 2014-06-28 DIAGNOSIS — E1149 Type 2 diabetes mellitus with other diabetic neurological complication: Secondary | ICD-10-CM

## 2014-06-28 DIAGNOSIS — F333 Major depressive disorder, recurrent, severe with psychotic symptoms: Secondary | ICD-10-CM

## 2014-06-28 NOTE — Assessment & Plan Note (Signed)
Thumbnails debrided with power sanding tool

## 2014-06-28 NOTE — Assessment & Plan Note (Signed)
Has had low reactions so now off glipizide Will continue to monitor

## 2014-06-28 NOTE — Assessment & Plan Note (Signed)
Satisfied with pain control 

## 2014-06-28 NOTE — Assessment & Plan Note (Signed)
Not a sig problem but still bothers her No action now

## 2014-06-28 NOTE — Assessment & Plan Note (Signed)
Mood is controlled and in remission No recent psychosis but should not wean antipsychotic

## 2014-06-28 NOTE — Assessment & Plan Note (Signed)
Has been stable No reason for hematology follow up at this time

## 2014-06-28 NOTE — Progress Notes (Signed)
Subjective:    Patient ID: Veronica Stafford, female    DOB: 09/01/1928, 81 y.o.   MRN: [id number]  HPI Visit for follow up of chronic medical problems Reviewed status with Marcie Bal RN  Recent recurrent hypoglycemic spells Glipizide stopped Doing better now Last sugar 78 without symptoms Nerve pain in feet controlled with the gabapentin  Still bothered by the hernia Doesn't like it sticking out Still has occasional "bellyaches" from this Appetite is "not good"  Mood is okay No worsening of depression No hallucinations  No chest pain No SOB No dizziness or syncope  Current Outpatient Prescriptions on File Prior to Visit  Medication Sig Dispense Refill  . allopurinol (ZYLOPRIM) 300 MG tablet TAKE 1 TABLET BY MOUTH ONCE DAILY FOR GOUT 30 tablet 11  . atenolol (TENORMIN) 25 MG tablet Take 25 mg by mouth daily.      . bisacodyl (DULCOLAX) 5 MG EC tablet Take 5 mg by mouth every other day as needed for moderate constipation.    . clonazePAM (KLONOPIN) 0.5 MG tablet Take 1 tablet (0.5 mg total) by mouth 3 (three) times daily as needed for anxiety. 90 tablet 5  . cyanocobalamin 1000 MCG tablet Take 100 mcg by mouth daily.    . ergocalciferol (VITAMIN D2) 50000 UNITS capsule Take 50,000 Units by mouth every 30 (thirty) days.      Marland Kitchen FLUoxetine (PROZAC) 40 MG capsule Take 1 capsule (40 mg total) by mouth daily. 30 capsule 11  . furosemide (LASIX) 80 MG tablet Take 80 mg by mouth daily.      Marland Kitchen gabapentin (NEURONTIN) 300 MG capsule Take 1 capsule (300 mg total) by mouth 3 (three) times daily. Dx: E11.42 90 capsule 11  . ibuprofen (ADVIL,MOTRIN) 800 MG tablet Take 800 mg by mouth 3 (three) times daily as needed for pain.    . meclizine (ANTIVERT) 12.5 MG tablet Take 12.5 mg by mouth 3 (three) times daily as needed.    . Multiple Vitamins-Minerals (MULTIVITAMIN WITH MINERALS) tablet Take 1 tablet by mouth daily.     Marland Kitchen omeprazole (PRILOSEC) 20 MG capsule TAKE ONE CAPSULE BY MOUTH TWICE A DAY. *DO  NOT CRUSH* 60 capsule 11  . polyethylene glycol (MIRALAX / GLYCOLAX) packet Take 17 g by mouth 2 (two) times daily.     . risperiDONE (RISPERDAL) 1 MG tablet Take 2 tablets (2 mg total) by mouth at bedtime. 30 tablet 11  . sennosides-docusate sodium (SENOKOT-S) 8.6-50 MG tablet Take 2 tablets by mouth 2 (two) times daily.     . traMADol (ULTRAM) 50 MG tablet Take 2 tablets (100 mg total) by mouth 3 (three) times daily. 180 tablet 1  . zolpidem (AMBIEN) 10 MG tablet Take 10 mg by mouth at bedtime as needed for sleep.     No current facility-administered medications on file prior to visit.    Allergies  Allergen Reactions  . Codeine   . Demerol   . Erythromycin   . Fentanyl   . Flagyl [Metronidazole Hcl]   . Morphine And Related     Past Medical History  Diagnosis Date  . NASH (nonalcoholic steatohepatitis)   . Vitamin B12 deficiency   . Psychosis     paranoid hallucinations  . Pancytopenia     Dr Danie Chandler infusions  . Arthritis   . Anxiety   . Hypertension   . GERD (gastroesophageal reflux disease)   . Diabetes mellitus   . Osteoporosis   . Depression   . Lumbar disc  disease   . Wrist fracture, left 9/12    Past Surgical History  Procedure Laterality Date  . Joint replacement      TKR bilateral  . Elbow surgery      4 on each  . Mandible surgery      twice  . Abdominal hysterectomy    . Cholecystectomy    . Breast surgery      breast reduction  . Hemorrhoid surgery    . Right ear lesion removal    . Fixation kyphoplasty  1/12  . Cataract extraction w/ intraocular lens  implant, bilateral    . Revision of tkr  9/13    Cartilage replaced    Family History  Problem Relation Age of Onset  . Cancer Mother     colon  . Heart disease Father     History   Social History  . Marital Status: Widowed    Spouse Name: N/A  . Number of Children: N/A  . Years of Education: N/A   Occupational History  . Nurse     retired   Social History Main Topics  .  Smoking status: Never Smoker   . Smokeless tobacco: Never Used  . Alcohol Use: No  . Drug Use: Not on file  . Sexual Activity: Not on file   Other Topics Concern  . Not on file   Social History Narrative   1 son nearby--he is to make medical decisions   2 daughters   Has DNR   Would not accept tube feeds   Review of Systems Bowels have been regular Sleeps well Ongoing arthritis pain--ibuprofen and tramadol are effective No recent gout problems    Objective:   Physical Exam  Constitutional: She appears well-developed and well-nourished. No distress.  Neck: Normal range of motion. Neck supple. No thyromegaly present.  Cardiovascular: Normal rate, regular rhythm, normal heart sounds and intact distal pulses.  Exam reveals no gallop.   No murmur heard. Pulmonary/Chest: Effort normal and breath sounds normal. No respiratory distress. She has no wheezes. She has no rales.  Abdominal: Soft. She exhibits no distension. There is no tenderness.  Musculoskeletal: She exhibits no edema or tenderness.  Lymphadenopathy:    She has no cervical adenopathy.  Skin:  No foot lesions Thick mycotic thumbnails with distal ingrowing  Psychiatric: She has a normal mood and affect. Her behavior is normal.          Assessment & Plan:

## 2014-07-29 ENCOUNTER — Emergency Department: Payer: Medicare Other

## 2014-07-29 ENCOUNTER — Emergency Department
Admission: EM | Admit: 2014-07-29 | Discharge: 2014-07-29 | Disposition: A | Discharge status: Home / Self Care | Payer: Medicare Other | Attending: Emergency Medicine | Admitting: Emergency Medicine

## 2014-07-29 DIAGNOSIS — S92355A Nondisplaced fracture of fifth metatarsal bone, left foot, initial encounter for closed fracture: Secondary | ICD-10-CM | POA: Diagnosis not present

## 2014-07-29 DIAGNOSIS — E119 Type 2 diabetes mellitus without complications: Secondary | ICD-10-CM | POA: Diagnosis not present

## 2014-07-29 DIAGNOSIS — Y92129 Unspecified place in nursing home as the place of occurrence of the external cause: Secondary | ICD-10-CM | POA: Insufficient documentation

## 2014-07-29 DIAGNOSIS — S92302A Fracture of unspecified metatarsal bone(s), left foot, initial encounter for closed fracture: Secondary | ICD-10-CM

## 2014-07-29 DIAGNOSIS — W010XXA Fall on same level from slipping, tripping and stumbling without subsequent striking against object, initial encounter: Secondary | ICD-10-CM | POA: Insufficient documentation

## 2014-07-29 DIAGNOSIS — I10 Essential (primary) hypertension: Secondary | ICD-10-CM | POA: Insufficient documentation

## 2014-07-29 DIAGNOSIS — S51802A Unspecified open wound of left forearm, initial encounter: Secondary | ICD-10-CM | POA: Insufficient documentation

## 2014-07-29 DIAGNOSIS — S92335A Nondisplaced fracture of third metatarsal bone, left foot, initial encounter for closed fracture: Secondary | ICD-10-CM | POA: Insufficient documentation

## 2014-07-29 DIAGNOSIS — Y9389 Activity, other specified: Secondary | ICD-10-CM | POA: Diagnosis not present

## 2014-07-29 DIAGNOSIS — Y998 Other external cause status: Secondary | ICD-10-CM | POA: Diagnosis not present

## 2014-07-29 DIAGNOSIS — S99922A Unspecified injury of left foot, initial encounter: Secondary | ICD-10-CM | POA: Diagnosis present

## 2014-07-29 DIAGNOSIS — Z79899 Other long term (current) drug therapy: Secondary | ICD-10-CM | POA: Insufficient documentation

## 2014-07-29 DIAGNOSIS — S92345A Nondisplaced fracture of fourth metatarsal bone, left foot, initial encounter for closed fracture: Secondary | ICD-10-CM | POA: Insufficient documentation

## 2014-07-29 NOTE — ED Notes (Signed)
Patient to ED via EMS with report of fall last night at ALF, patient suffered skin tear to left forearm which is bandaged per staff. However, today patient began c/o left outer foot and initially was unable to ambulate. EMS however reports patient is able to ambulate and walked to stretcher. Patient reports pain started today.

## 2014-07-29 NOTE — ED Provider Notes (Signed)
[facility] Emergency Department Provider Note  Time seen: 6:12 PM  I have reviewed the triage vital signs and the nursing notes.   HISTORY  Chief Complaint Fall and Foot Injury    HPI Veronica Stafford is a 81 y.o. female with a past medical history of hypertension, anxiety, diabetes currently at a nursing home, patient was seen today for a fall which occurred last night. Patient states she tripped while trying to get to the bathroom and slid down the wall denies head injury denies loss of consciousness. Patient notes left foot pain since the event. Patient states it was difficult for her to walk today due to left foot pain so the nursing home sent her here for evaluation. Denies any headache, confusion, slurred speech, focal weakness or numbness. Patient did suffer a small skin tear to the left forearm which has been bandaged by the nursing home. Describes her pain as moderate, worse with walking, only in the left foot.    Past Medical History  Diagnosis Date  . NASH (nonalcoholic steatohepatitis)   . Vitamin B12 deficiency   . Psychosis     paranoid hallucinations  . Pancytopenia     Dr Danie Chandler infusions  . Arthritis   . Anxiety   . Hypertension   . GERD (gastroesophageal reflux disease)   . Diabetes mellitus   . Osteoporosis   . Depression   . Lumbar disc disease   . Wrist fracture, left 9/12    Patient Active Problem List   Diagnosis Date Noted  . Dermatophytosis of nail 06/28/2014  . Abdominal pain, generalized 03/29/2014  . Diabetes, polyneuropathy 03/23/2013  . Ventral hernia 09/16/2012  . Osteoarthritis, knee 11/12/2011  . Lumbar disc disease with radiculopathy 10/23/2010  . Severe major depression with psychotic features   . Pancytopenia   . Anxiety   . Hypertension   . GERD (gastroesophageal reflux disease)   . Type 2 diabetes mellitus with neurological manifestations, controlled   . Osteoporosis, unspecified   . Psychosis   .  Vitamin B12 deficiency   . NASH (nonalcoholic steatohepatitis)     Past Surgical History  Procedure Laterality Date  . Joint replacement      TKR bilateral  . Elbow surgery      4 on each  . Mandible surgery      twice  . Abdominal hysterectomy    . Cholecystectomy    . Breast surgery      breast reduction  . Hemorrhoid surgery    . Right ear lesion removal    . Fixation kyphoplasty  1/12  . Cataract extraction w/ intraocular lens  implant, bilateral    . Revision of tkr  9/13    Cartilage replaced    Current Outpatient Rx  Name  Route  Sig  Dispense  Refill  . allopurinol (ZYLOPRIM) 300 MG tablet      TAKE 1 TABLET BY MOUTH ONCE DAILY FOR GOUT   30 tablet   11   . atenolol (TENORMIN) 25 MG tablet   Oral   Take 25 mg by mouth daily.           . bisacodyl (DULCOLAX) 5 MG EC tablet   Oral   Take 5 mg by mouth every other day as needed for moderate constipation.         . clonazePAM (KLONOPIN) 0.5 MG tablet   Oral   Take 1 tablet (0.5 mg total) by mouth 3 (three) times daily as  needed for anxiety.   90 tablet   5   . cyanocobalamin 1000 MCG tablet   Oral   Take 100 mcg by mouth daily.         . ergocalciferol (VITAMIN D2) 50000 UNITS capsule   Oral   Take 50,000 Units by mouth every 30 (thirty) days.           Marland Kitchen FLUoxetine (PROZAC) 40 MG capsule   Oral   Take 1 capsule (40 mg total) by mouth daily.   30 capsule   11   . furosemide (LASIX) 80 MG tablet   Oral   Take 80 mg by mouth daily.           Marland Kitchen gabapentin (NEURONTIN) 300 MG capsule   Oral   Take 1 capsule (300 mg total) by mouth 3 (three) times daily. Dx: E11.42   90 capsule   11   . ibuprofen (ADVIL,MOTRIN) 800 MG tablet   Oral   Take 800 mg by mouth 3 (three) times daily as needed for pain.         . meclizine (ANTIVERT) 12.5 MG tablet   Oral   Take 12.5 mg by mouth 3 (three) times daily as needed.         . Multiple Vitamins-Minerals (MULTIVITAMIN WITH MINERALS) tablet    Oral   Take 1 tablet by mouth daily.          Marland Kitchen omeprazole (PRILOSEC) 20 MG capsule      TAKE ONE CAPSULE BY MOUTH TWICE A DAY. *DO NOT CRUSH*   60 capsule   11   . polyethylene glycol (MIRALAX / GLYCOLAX) packet   Oral   Take 17 g by mouth 2 (two) times daily.          . risperiDONE (RISPERDAL) 1 MG tablet   Oral   Take 2 tablets (2 mg total) by mouth at bedtime.   30 tablet   11   . sennosides-docusate sodium (SENOKOT-S) 8.6-50 MG tablet   Oral   Take 2 tablets by mouth 2 (two) times daily.          . traMADol (ULTRAM) 50 MG tablet   Oral   Take 2 tablets (100 mg total) by mouth 3 (three) times daily.   180 tablet   1   . zolpidem (AMBIEN) 10 MG tablet   Oral   Take 10 mg by mouth at bedtime as needed for sleep.           Allergies Codeine; Demerol; Erythromycin; Fentanyl; Flagyl; and Morphine and related  Family History  Problem Relation Age of Onset  . Cancer Mother     colon  . Heart disease Father     Social History History  Substance Use Topics  . Smoking status: Never Smoker   . Smokeless tobacco: Never Used  . Alcohol Use: No    Review of Systems Constitutional: Negative for fever.. Negative for headache  Cardiovascular: Negative for chest pain. Respiratory: Negative for shortness of breath. Gastrointestinal: Negative for abdominal pain Musculoskeletal: Negative for back pain. Skin:  Skin tear to left forearm Neurological: Negative for focal weakness or numbness.  10-point ROS otherwise negative.  ____________________________________________   PHYSICAL EXAM:  VITAL SIGNS: ED Triage Vitals  Enc Vitals Group     BP 07/29/14 1718 127/64 mmHg     Pulse Rate 07/29/14 1718 66     Resp 07/29/14 1718 18     Temp 07/29/14 1718 98.7 F (37.1  C)     Temp Source 07/29/14 1718 Oral     SpO2 07/29/14 1718 98 %     Weight 07/29/14 1718 196 lb (88.905 kg)     Height 07/29/14 1718 5\' 8"  (1.727 m)     Head Cir --      Peak Flow --       Pain Score 07/29/14 1720 8     Pain Loc --      Pain Edu? --      Excl. in [place]? --     Constitutional: Alert and oriented. Well appearing and in no distress. Eyes: Normal exam ENT   Head: Normocephalic and atraumatic.   Mouth/Throat: Mucous membranes are moist. Cardiovascular: Normal rate, regular rhythm. Respiratory: Normal respiratory effort without tachypnea nor retractions. Breath sounds are clear  Gastrointestinal: Soft and nontender. No distention.  Musculoskeletal:  Moderate tenderness palpation of the left distal foot, ankle range of motion intact without pain, all other joints including hips appear normal. Neurologic:  Normal speech and language. No gross focal neurologic deficits Skin:  Skin is warm, dry and intact.  Psychiatric: Mood and affect are normal. Speech and behavior are normal.   ____________________________________________    RADIOLOGY  fractures of the third, fourth, fifth distal metatarsals  ____________________________________________   INITIAL IMPRESSION / ASSESSMENT AND PLAN / ED COURSE  Pertinent labs & imaging results that were available during my care of the patient were reviewed by me and considered in my medical decision making (see chart for details).  Patient with pain to the left foot following a fall yesterday. Denies head injury, loss of consciousness or headache. Patient appears very well currently but states significant pain when she tries to walk. X-ray consistent with third/fourth/fifth metatarsal fractures. We'll place the patient in a hard sole shoe, with use of a Walker, and orthopedic follow-up this week. Patient agreeable to plan.  ____________________________________________   FINAL CLINICAL IMPRESSION(S) / ED DIAGNOSES  third, fourth, fifth metatarsal fractures   Harvest Dark, MD 07/29/14 [facility]

## 2014-07-29 NOTE — Discharge Instructions (Signed)
Metatarsal Fracture, Undisplaced A metatarsal fracture is a break in the bone(s) of the foot. These are the bones of the foot that connect your toes to the bones of the ankle. DIAGNOSIS  The diagnoses of these fractures are usually made with X-rays. If there are problems in the forefoot and x-rays are normal a later bone scan will usually make the diagnosis.  TREATMENT AND HOME CARE INSTRUCTIONS  Treatment may or may not include a cast or walking shoe. When casts are needed the use is usually for short periods of time so as not to slow down healing with muscle wasting (atrophy).  Activities should be stopped until further advised by your caregiver.  Wear shoes with adequate shock absorbing capabilities and stiff soles.  Alternative exercise may be undertaken while waiting for healing. These may include bicycling and swimming, or as your caregiver suggests.  It is important to keep all follow-up visits or specialty referrals. The failure to keep these appointments could result in improper bone healing and chronic pain or disability.  Warning: Do not drive a car or operate a motor vehicle until your caregiver specifically tells you it is safe to do so. IF YOU DO NOT HAVE A CAST OR SPLINT:  You may walk on your injured foot as tolerated or advised.  Do not put any weight on your injured foot for as long as directed by your caregiver. Slowly increase the amount of time you walk on the foot as the pain allows or as advised.  Use crutches until you can bear weight without pain. A gradual increase in weight bearing may help.  Apply ice to the injury for 15-20 minutes each hour while awake for the first 2 days. Put the ice in a plastic bag and place a towel between the bag of ice and your skin.  Only take over-the-counter or prescription medicines for pain, discomfort, or fever as directed by your caregiver. SEEK IMMEDIATE MEDICAL CARE IF:   Your cast gets damaged or breaks.  You have  continued severe pain or more swelling than you did before the cast was put on, or the pain is not controlled with medications.  Your skin or nails below the injury turn blue or grey, or feel cold or numb.  There is a bad smell, or new stains or pus-like (purulent) drainage coming from the cast. MAKE SURE YOU:   Understand these instructions.  Will watch your condition.  Will get help right away if you are not doing well or get worse. Document Released: 11/15/2001 Document Revised: 05/18/2011 Document Reviewed: 10/07/2007 [facility] Patient Information 2015 [place], [place]. This information is not intended to replace advice given to you by your health care provider. Make sure you discuss any questions you have with your health care provider.      Please follow-up with orthopedics by calling the number provided within 3-5 days. Please use a walker at all times when ambulating, if you are unable to walk with a walker due to pain please use a wheelchair until you can see the orthopedist. Return to the emergency department for any increased pain, or personally concerning symptoms.

## 2014-07-29 NOTE — ED Notes (Signed)
Pt request ems transport back to [facility]. Pt and son made aware that pt would probably have to pay for the ems transportation. Pt stated that she could not get into [facility] and for me to call ems. Pt's son stated that this is what mom usually does. Again, made sure both understood about pt being financially responsible for ems transportation costs. Again told to call ems for transport.

## 2014-07-30 DIAGNOSIS — E1140 Type 2 diabetes mellitus with diabetic neuropathy, unspecified: Secondary | ICD-10-CM | POA: Diagnosis not present

## 2014-07-30 DIAGNOSIS — M5116 Intervertebral disc disorders with radiculopathy, lumbar region: Secondary | ICD-10-CM | POA: Diagnosis not present

## 2014-07-30 DIAGNOSIS — F333 Major depressive disorder, recurrent, severe with psychotic symptoms: Secondary | ICD-10-CM | POA: Diagnosis not present

## 2014-07-30 LAB — HEMOGLOBIN A1C: Hgb A1c MFr Bld: 6.2 % — AB (ref 4.0–6.0)

## 2014-08-03 ENCOUNTER — Encounter: Payer: Self-pay | Admitting: *Deleted

## 2014-08-03 ENCOUNTER — Encounter: Payer: Self-pay | Admitting: Internal Medicine

## 2014-08-14 DIAGNOSIS — S92355A Nondisplaced fracture of fifth metatarsal bone, left foot, initial encounter for closed fracture: Secondary | ICD-10-CM | POA: Diagnosis not present

## 2014-08-14 DIAGNOSIS — M5116 Intervertebral disc disorders with radiculopathy, lumbar region: Secondary | ICD-10-CM | POA: Diagnosis not present

## 2014-08-14 DIAGNOSIS — E1140 Type 2 diabetes mellitus with diabetic neuropathy, unspecified: Secondary | ICD-10-CM | POA: Diagnosis not present

## 2014-08-14 DIAGNOSIS — F333 Major depressive disorder, recurrent, severe with psychotic symptoms: Secondary | ICD-10-CM | POA: Diagnosis not present

## 2014-11-22 ENCOUNTER — Non-Acute Institutional Stay: Payer: Medicare Other | Admitting: Internal Medicine

## 2014-11-22 ENCOUNTER — Encounter: Payer: Self-pay | Admitting: Internal Medicine

## 2014-11-22 VITALS — BP 110/57 | HR 68 | Temp 97.4°F | Resp 12 | Wt 190.0 lb

## 2014-11-22 DIAGNOSIS — D61818 Other pancytopenia: Secondary | ICD-10-CM

## 2014-11-22 DIAGNOSIS — E1140 Type 2 diabetes mellitus with diabetic neuropathy, unspecified: Secondary | ICD-10-CM

## 2014-11-22 DIAGNOSIS — E1149 Type 2 diabetes mellitus with other diabetic neurological complication: Secondary | ICD-10-CM

## 2014-11-22 DIAGNOSIS — F333 Major depressive disorder, recurrent, severe with psychotic symptoms: Secondary | ICD-10-CM | POA: Diagnosis not present

## 2014-11-22 DIAGNOSIS — K7581 Nonalcoholic steatohepatitis (NASH): Secondary | ICD-10-CM | POA: Diagnosis not present

## 2014-11-22 DIAGNOSIS — I10 Essential (primary) hypertension: Secondary | ICD-10-CM

## 2014-11-22 DIAGNOSIS — K439 Ventral hernia without obstruction or gangrene: Secondary | ICD-10-CM | POA: Diagnosis not present

## 2014-11-22 DIAGNOSIS — B351 Tinea unguium: Secondary | ICD-10-CM

## 2014-11-22 NOTE — Assessment & Plan Note (Signed)
No symptoms DM well controlled Recent LFTs okay

## 2014-11-22 NOTE — Assessment & Plan Note (Signed)
Generally controlled I believe her focus on the hernia is part of this though No changes needed--would not wean risperdal given severe past psychosis

## 2014-11-22 NOTE — Assessment & Plan Note (Signed)
BP Readings from Last 3 Encounters:  11/22/14 110/57  07/29/14 132/67  06/28/14 122/86   Good control

## 2014-11-22 NOTE — Assessment & Plan Note (Signed)
Large and no chance of incarceration or strangulation Reassured her--no action is needed

## 2014-11-22 NOTE — Progress Notes (Signed)
Subjective:    Patient ID: Veronica Stafford, female    DOB: 1928/06/30, 81 y.o.   MRN: [id number]  HPI Visit for follow up of chronic medical problems Reviewed status with [name] RN  Having ongoing problems with hernia She feels it has moved--and now under rib cage Not eating great No vomiting. Occasionally some nausea Bowels are okay  Had metatarsal fractures in left foot--this is back to normal Walks with rollator  Sugars okay Checked weekly and 81/96 recently  No hypoglycemia  Foot pain reasonably controlled with the gabapentin  Depression controlled Prefers to stay in room other than for meals Not interested in the activities No hallucinations recently  Current Outpatient Prescriptions on File Prior to Visit  Medication Sig Dispense Refill  . allopurinol (ZYLOPRIM) 300 MG tablet TAKE 1 TABLET BY MOUTH ONCE DAILY FOR GOUT 30 tablet 11  . atenolol (TENORMIN) 25 MG tablet Take 25 mg by mouth daily.      . bisacodyl (DULCOLAX) 5 MG EC tablet Take 5 mg by mouth every other day as needed for moderate constipation.    . clonazePAM (KLONOPIN) 0.5 MG tablet Take 1 tablet (0.5 mg total) by mouth 3 (three) times daily as needed for anxiety. 90 tablet 5  . cyanocobalamin 1000 MCG tablet Take 100 mcg by mouth daily.    . ergocalciferol (VITAMIN D2) 50000 UNITS capsule Take 50,000 Units by mouth every 30 (thirty) days.      Marland Kitchen FLUoxetine (PROZAC) 40 MG capsule Take 1 capsule (40 mg total) by mouth daily. 30 capsule 11  . furosemide (LASIX) 80 MG tablet Take 80 mg by mouth daily.      Marland Kitchen gabapentin (NEURONTIN) 300 MG capsule Take 1 capsule (300 mg total) by mouth 3 (three) times daily. Dx: E11.42 90 capsule 11  . Multiple Vitamins-Minerals (MULTIVITAMIN WITH MINERALS) tablet Take 1 tablet by mouth daily.     Marland Kitchen omeprazole (PRILOSEC) 20 MG capsule TAKE ONE CAPSULE BY MOUTH TWICE A DAY. *DO NOT CRUSH* 60 capsule 11  . risperiDONE (RISPERDAL) 1 MG tablet Take 2 tablets (2 mg total) by mouth at  bedtime. 30 tablet 11  . sennosides-docusate sodium (SENOKOT-S) 8.6-50 MG tablet Take 2 tablets by mouth 2 (two) times daily.     . traMADol (ULTRAM) 50 MG tablet Take 2 tablets (100 mg total) by mouth 3 (three) times daily. 180 tablet 1  . zolpidem (AMBIEN) 10 MG tablet Take 10 mg by mouth at bedtime as needed for sleep.     No current facility-administered medications on file prior to visit.    Allergies  Allergen Reactions  . Codeine   . Demerol   . Erythromycin   . Fentanyl   . Flagyl [Metronidazole Hcl]   . Morphine And Related     Past Medical History  Diagnosis Date  . NASH (nonalcoholic steatohepatitis)   . Vitamin B12 deficiency   . Psychosis     paranoid hallucinations  . Pancytopenia     Dr Danie Chandler infusions  . Arthritis   . Anxiety   . Hypertension   . GERD (gastroesophageal reflux disease)   . Diabetes mellitus   . Osteoporosis   . Depression   . Lumbar disc disease   . Wrist fracture, left 9/12    Past Surgical History  Procedure Laterality Date  . Joint replacement      TKR bilateral  . Elbow surgery      4 on each  . Mandible surgery  twice  . Abdominal hysterectomy    . Cholecystectomy    . Breast surgery      breast reduction  . Hemorrhoid surgery    . Right ear lesion removal    . Fixation kyphoplasty  1/12  . Cataract extraction w/ intraocular lens  implant, bilateral    . Revision of tkr  9/13    Cartilage replaced    Family History  Problem Relation Age of Onset  . Cancer Mother     colon  . Heart disease Father     Social History   Social History  . Marital Status: Widowed    Spouse Name: N/A  . Number of Children: N/A  . Years of Education: N/A   Occupational History  . Nurse     retired   Social History Main Topics  . Smoking status: Never Smoker   . Smokeless tobacco: Never Used  . Alcohol Use: No  . Drug Use: Not on file  . Sexual Activity: Not on file   Other Topics Concern  . Not on file    Social History Narrative   1 son nearby--he is to make medical decisions   2 daughters   Has DNR   Would not accept tube feeds   Review of Systems Weight down about 6#--but includes time in health care Some hemorrhoidal pain and bleeding--she is getting relief with preparation H Sleeps okay with meds Still with back pain--satisfied with relief from tramadol    Objective:   Physical Exam  Constitutional: She appears well-developed and well-nourished. No distress.  Neck: Normal range of motion. Neck supple. No thyromegaly present.  Cardiovascular: Normal rate, regular rhythm, normal heart sounds and intact distal pulses.  Exam reveals no gallop.   No murmur heard. Pulmonary/Chest: Effort normal and breath sounds normal. No respiratory distress. She has no wheezes. She has no rales.  Abdominal: Soft. She exhibits no distension. There is no tenderness. There is no rebound and no guarding.  Large midline ventral hernia from xiphoid to umbilicus No tenderness  Musculoskeletal: She exhibits no edema or tenderness.  Lymphadenopathy:    She has no cervical adenopathy.  Neurological:  Decreased sensation in feet  Skin:  No foot lesions Toenails trimmed Both thumbnails are mycotic and very long          Assessment & Plan:

## 2014-11-22 NOTE — Assessment & Plan Note (Signed)
Mostly low platelets Stable for years No action needed

## 2014-11-22 NOTE — Assessment & Plan Note (Signed)
Lab Results  Component Value Date   HGBA1C 6.2* 07/30/2014   Good control without hypoglycemia Gabapentin effective for neuropathic pain

## 2014-11-22 NOTE — Assessment & Plan Note (Signed)
Both thumb nails debrided with power grinder Tolerated well

## 2015-01-21 LAB — BASIC METABOLIC PANEL: CREATININE: 1.2 mg/dL — AB (ref 0.5–1.1)

## 2015-01-21 LAB — CBC AND DIFFERENTIAL
Platelets: 60 10*3/uL — AB (ref 150–399)
WBC: 3.2 10^3/mL

## 2015-01-21 LAB — HEMOGLOBIN A1C: HEMOGLOBIN A1C: 6.3 % — AB (ref 4.0–6.0)

## 2015-01-23 ENCOUNTER — Encounter: Payer: Self-pay | Admitting: Internal Medicine

## 2015-02-05 ENCOUNTER — Encounter: Payer: Self-pay | Admitting: Internal Medicine

## 2015-02-27 ENCOUNTER — Encounter: Payer: Self-pay | Admitting: Internal Medicine

## 2015-02-27 ENCOUNTER — Non-Acute Institutional Stay: Payer: Medicare Other | Admitting: Internal Medicine

## 2015-02-27 VITALS — BP 90/60 | HR 72 | Temp 98.5°F | Resp 16 | Wt 191.0 lb

## 2015-02-27 DIAGNOSIS — M5442 Lumbago with sciatica, left side: Secondary | ICD-10-CM

## 2015-02-27 NOTE — Patient Instructions (Signed)

## 2015-02-27 NOTE — Progress Notes (Signed)
Subjective:    Patient ID: Veronica Stafford, female    DOB: July 05, 1928, 81 y.o.   MRN: [id number]  HPI  Asked to evaluate resident in [address] Ongoing low back pain x 3 weeks Pain radiates into her left leg She does have associated numbness and weakness No recent falls She is on Gabapentin, and Tramadol but reports it has not been helpful Per pt, she has a history of multiple back surgeries.  Review of Systems      Past Medical History  Diagnosis Date  . NASH (nonalcoholic steatohepatitis)   . Vitamin B12 deficiency   . Psychosis     paranoid hallucinations  . Pancytopenia     Dr Danie Chandler infusions  . Arthritis   . Anxiety   . Hypertension   . GERD (gastroesophageal reflux disease)   . Diabetes mellitus   . Osteoporosis   . Depression   . Lumbar disc disease   . Wrist fracture, left 9/12    Current Outpatient Prescriptions  Medication Sig Dispense Refill  . allopurinol (ZYLOPRIM) 300 MG tablet TAKE 1 TABLET BY MOUTH ONCE DAILY FOR GOUT 30 tablet 11  . atenolol (TENORMIN) 25 MG tablet Take 25 mg by mouth daily.      . bisacodyl (DULCOLAX) 5 MG EC tablet Take 5 mg by mouth every other day as needed for moderate constipation.    . clonazePAM (KLONOPIN) 0.5 MG tablet Take 1 tablet (0.5 mg total) by mouth 3 (three) times daily as needed for anxiety. 90 tablet 5  . cyanocobalamin 1000 MCG tablet Take 100 mcg by mouth daily.    Marland Kitchen desonide (DESOWEN) 0.05 % ointment Apply 1 application topically 3 (three) times daily as needed.    . ergocalciferol (VITAMIN D2) 50000 UNITS capsule Take 50,000 Units by mouth every 30 (thirty) days.      Marland Kitchen FLUoxetine (PROZAC) 40 MG capsule Take 1 capsule (40 mg total) by mouth daily. 30 capsule 11  . furosemide (LASIX) 80 MG tablet Take 80 mg by mouth daily.      Marland Kitchen gabapentin (NEURONTIN) 300 MG capsule Take 1 capsule (300 mg total) by mouth 3 (three) times daily. Dx: E11.42 90 capsule 11  . Multiple Vitamins-Minerals (MULTIVITAMIN WITH MINERALS)  tablet Take 1 tablet by mouth daily.     Marland Kitchen omeprazole (PRILOSEC) 20 MG capsule TAKE ONE CAPSULE BY MOUTH TWICE A DAY. *DO NOT CRUSH* 60 capsule 11  . risperiDONE (RISPERDAL) 1 MG tablet Take 2 tablets (2 mg total) by mouth at bedtime. 30 tablet 11  . sennosides-docusate sodium (SENOKOT-S) 8.6-50 MG tablet Take 2 tablets by mouth 2 (two) times daily.     . traMADol (ULTRAM) 50 MG tablet Take 2 tablets (100 mg total) by mouth 3 (three) times daily. 180 tablet 1  . zolpidem (AMBIEN) 10 MG tablet Take 10 mg by mouth at bedtime as needed for sleep.     No current facility-administered medications for this visit.    Allergies  Allergen Reactions  . Codeine   . Demerol   . Erythromycin   . Fentanyl   . Flagyl [Metronidazole Hcl]   . Morphine And Related     Family History  Problem Relation Age of Onset  . Cancer Mother     colon  . Heart disease Father     Social History   Social History  . Marital Status: Widowed    Spouse Name: N/A  . Number of Children: N/A  . Years of Education:  N/A   Occupational History  . Nurse     retired   Social History Main Topics  . Smoking status: Never Smoker   . Smokeless tobacco: Never Used  . Alcohol Use: No  . Drug Use: Not on file  . Sexual Activity: Not on file   Other Topics Concern  . Not on file   Social History Narrative   1 son nearby--he is to make medical decisions   2 daughters   Has DNR   Would not accept tube feeds     Constitutional: Denies fever, malaise, fatigue, headache or abrupt weight changes.  Respiratory: Denies difficulty breathing, shortness of breath, cough or sputum production.   Cardiovascular: Denies chest pain, chest tightness, palpitations or swelling in the hands or feet.  Musculoskeletal: Pt reports low back pain. Denies difficulty with gait, muscle pain or joint swelling.  Skin: Denies redness, rashes, lesions or ulcercations.     No other specific complaints in a complete review of systems  (except as listed in HPI above).  Objective:   Physical Exam   BP 90/60 mmHg  Pulse 72  Temp(Src) 98.5 F (36.9 C)  Resp 16  Wt 191 lb (86.637 kg) Wt Readings from Last 3 Encounters:  02/27/15 191 lb (86.637 kg)  11/22/14 190 lb (86.183 kg)  07/29/14 196 lb (88.905 kg)    General: Appears her stated age, well developed, well nourished in NAD. Musculoskeletal: Decreased flexion and rotation of the spine. Normal extension. Pain with palpation over the lumbar spine. Unable to perform straight leg raise- examined in chair. Neuro: Sensation intact to BLE.  BMET    Component Value Date/Time   NA 141 11/21/2011 0416   K 3.6 11/21/2011 0416   CL 107 11/21/2011 0416   CO2 25 11/21/2011 0416   GLUCOSE 124* 11/21/2011 0416   BUN 13 11/21/2011 0416   CREATININE 1.2* 01/21/2015   CREATININE 0.95 11/21/2011 0416   CALCIUM 7.7* 11/21/2011 0416   GFRNONAA 56* 11/21/2011 0416   GFRAA >60 11/21/2011 0416    Lipid Panel     Component Value Date/Time   LDLCALC 98 08/14/2013    CBC    Component Value Date/Time   WBC 3.2 01/21/2015   WBC 3.4* 05/17/2012 1040   RBC 3.94 05/17/2012 1040   HGB 11.0* 05/17/2012 1040   HCT 33.7* 05/17/2012 1040   PLT 60* 01/21/2015   PLT 62* 05/17/2012 1040   MCV 86 05/17/2012 1040   MCH 28.0 05/17/2012 1040   MCHC 32.7 05/17/2012 1040   RDW 16.2* 05/17/2012 1040   LYMPHSABS 0.6* 05/17/2012 1040   MONOABS 0.3 05/17/2012 1040   EOSABS 0.1 05/17/2012 1040   BASOSABS 0.0 05/17/2012 1040    Hgb A1C Lab Results  Component Value Date   HGBA1C 6.3* 01/21/2015        Assessment & Plan:   Low back pain with left side sciatica:  Continue Gabapentin and Tramadol Will obtain xray of lumbar spine eRx for Pred Taper x 9 days Resident refuses PT Consider adding Norco if pain persist  Will reevaluate as needed

## 2015-03-07 ENCOUNTER — Non-Acute Institutional Stay: Payer: Medicare Other | Admitting: Internal Medicine

## 2015-03-07 ENCOUNTER — Encounter: Payer: Self-pay | Admitting: Internal Medicine

## 2015-03-07 VITALS — BP 102/58 | HR 68 | Temp 98.9°F | Resp 16 | Wt 191.0 lb

## 2015-03-07 DIAGNOSIS — E1149 Type 2 diabetes mellitus with other diabetic neurological complication: Secondary | ICD-10-CM | POA: Diagnosis not present

## 2015-03-07 DIAGNOSIS — D61818 Other pancytopenia: Secondary | ICD-10-CM

## 2015-03-07 DIAGNOSIS — M5116 Intervertebral disc disorders with radiculopathy, lumbar region: Secondary | ICD-10-CM | POA: Diagnosis not present

## 2015-03-07 DIAGNOSIS — F339 Major depressive disorder, recurrent, unspecified: Secondary | ICD-10-CM | POA: Diagnosis not present

## 2015-03-07 DIAGNOSIS — I10 Essential (primary) hypertension: Secondary | ICD-10-CM

## 2015-03-07 LAB — HM DIABETES FOOT EXAM

## 2015-03-07 MED ORDER — GABAPENTIN 300 MG PO CAPS: 600.0000 mg | ORAL_CAPSULE | Freq: Three times a day (TID) | ORAL | Status: AC

## 2015-03-07 NOTE — Progress Notes (Signed)
Subjective:    Patient ID: Veronica Stafford, female    DOB: Sep 13, 1928, 81 y.o.   MRN: [id number]  HPI Visit for review of multiple medical conditions Reviewed status with [facility] RN  Ongoing severe back and radicular pain Prednisone taper doesn't seem to have helped much Pain is day and night Left leg is partially numb Some weakness--but still walking okay with the rollator  Sugars all under 100 No hypoglycemic spells-- no Rx Ongoing foot numbness (separate from radiculopathy)  No chest pain No SOB Does have fatigued feeling---?related to pain Palpitations at times No abnormal bleeding  Mood has been okay No hallucinations Not depressed at this point--despite the pain  Current Outpatient Prescriptions on File Prior to Visit  Medication Sig Dispense Refill  . allopurinol (ZYLOPRIM) 300 MG tablet TAKE 1 TABLET BY MOUTH ONCE DAILY FOR GOUT 30 tablet 11  . atenolol (TENORMIN) 25 MG tablet Take 25 mg by mouth daily.      . bisacodyl (DULCOLAX) 5 MG EC tablet Take 5 mg by mouth every other day as needed for moderate constipation.    . clonazePAM (KLONOPIN) 0.5 MG tablet Take 1 tablet (0.5 mg total) by mouth 3 (three) times daily as needed for anxiety. 90 tablet 5  . cyanocobalamin 1000 MCG tablet Take 100 mcg by mouth daily.    Marland Kitchen desonide (DESOWEN) 0.05 % ointment Apply 1 application topically 3 (three) times daily as needed.    . ergocalciferol (VITAMIN D2) 50000 UNITS capsule Take 50,000 Units by mouth every 30 (thirty) days.      Marland Kitchen FLUoxetine (PROZAC) 40 MG capsule Take 1 capsule (40 mg total) by mouth daily. 30 capsule 11  . furosemide (LASIX) 80 MG tablet Take 80 mg by mouth daily.      Marland Kitchen gabapentin (NEURONTIN) 300 MG capsule Take 1 capsule (300 mg total) by mouth 3 (three) times daily. Dx: E11.42 90 capsule 11  . Multiple Vitamins-Minerals (MULTIVITAMIN WITH MINERALS) tablet Take 1 tablet by mouth daily.     Marland Kitchen omeprazole (PRILOSEC) 20 MG capsule TAKE ONE CAPSULE BY MOUTH TWICE A  DAY. *DO NOT CRUSH* 60 capsule 11  . risperiDONE (RISPERDAL) 1 MG tablet Take 2 tablets (2 mg total) by mouth at bedtime. 30 tablet 11  . sennosides-docusate sodium (SENOKOT-S) 8.6-50 MG tablet Take 2 tablets by mouth 2 (two) times daily.     . traMADol (ULTRAM) 50 MG tablet Take 2 tablets (100 mg total) by mouth 3 (three) times daily. 180 tablet 1  . zolpidem (AMBIEN) 10 MG tablet Take 10 mg by mouth at bedtime as needed for sleep.     No current facility-administered medications on file prior to visit.    Allergies  Allergen Reactions  . Codeine   . Demerol   . Erythromycin   . Fentanyl   . Flagyl [Metronidazole Hcl]   . Morphine And Related     Past Medical History  Diagnosis Date  . NASH (nonalcoholic steatohepatitis)   . Vitamin B12 deficiency   . Psychosis     paranoid hallucinations  . Pancytopenia     Dr Danie Chandler infusions  . Arthritis   . Anxiety   . Hypertension   . GERD (gastroesophageal reflux disease)   . Diabetes mellitus   . Osteoporosis   . Depression   . Lumbar disc disease   . Wrist fracture, left 9/12    Past Surgical History  Procedure Laterality Date  . Joint replacement  TKR bilateral  . Elbow surgery      4 on each  . Mandible surgery      twice  . Abdominal hysterectomy    . Cholecystectomy    . Breast surgery      breast reduction  . Hemorrhoid surgery    . Right ear lesion removal    . Fixation kyphoplasty  1/12  . Cataract extraction w/ intraocular lens  implant, bilateral    . Revision of tkr  9/13    Cartilage replaced    Family History  Problem Relation Age of Onset  . Cancer Mother     colon  . Heart disease Father     Social History   Social History  . Marital Status: Widowed    Spouse Name: N/A  . Number of Children: N/A  . Years of Education: N/A   Occupational History  . Nurse     retired   Social History Main Topics  . Smoking status: Never Smoker   . Smokeless tobacco: Never Used  . Alcohol  Use: No  . Drug Use: Not on file  . Sexual Activity: Not on file   Other Topics Concern  . Not on file   Social History Narrative   1 son nearby--he is to make medical decisions   2 daughters   Has DNR   Would not accept tube feeds   Review of Systems Appetite is fine Weight stable Usually sleeps okay--though affected by pain No skin issues No abdominal pain    Objective:   Physical Exam  Constitutional: She appears well-developed and well-nourished. No distress.  Neck: Normal range of motion. Neck supple. No thyromegaly present.  Cardiovascular: Normal rate, regular rhythm, normal heart sounds and intact distal pulses.  Exam reveals no gallop.   No murmur heard. Pulmonary/Chest: Effort normal and breath sounds normal. No respiratory distress. She has no wheezes. She has no rales.  Abdominal: Soft. There is no tenderness.  Musculoskeletal: She exhibits no edema.  Lymphadenopathy:    She has no cervical adenopathy.  Neurological:  No focal leg weakness Normal tone Still with decreased sensation in feet  Skin:  No foot lesions  Psychiatric: She has a normal mood and affect. Her behavior is normal.          Assessment & Plan:

## 2015-03-07 NOTE — Assessment & Plan Note (Signed)
Vague fatigue but otherwise asymptomatic No Rx

## 2015-03-07 NOTE — Assessment & Plan Note (Signed)
With psychotic features Wean of risperdal or fluoxetine contraindicated due to stability now and relapse off antipsychotic

## 2015-03-07 NOTE — Assessment & Plan Note (Signed)
Ongoing worse pain Prednisone no help Will increase gabapentin Lidocaine patch prn also Want to avoid further surgery

## 2015-03-07 NOTE — Assessment & Plan Note (Signed)
BP Readings from Last 3 Encounters:  03/07/15 102/58  02/27/15 90/60  11/22/14 110/57   Good control Will try off atenolol

## 2015-03-07 NOTE — Assessment & Plan Note (Signed)
Lab Results  Component Value Date   HGBA1C 6.3* 01/21/2015   Good control No Rx

## 2015-04-26 ENCOUNTER — Inpatient Hospital Stay
Admission: EM | Admit: 2015-04-26 | Discharge: 2015-04-28 | DRG: 603 | Disposition: A | Discharge status: Skilled Nursing Facility | Payer: Medicare Other | Attending: Internal Medicine | Admitting: Internal Medicine

## 2015-04-26 DIAGNOSIS — K219 Gastro-esophageal reflux disease without esophagitis: Secondary | ICD-10-CM | POA: Diagnosis present

## 2015-04-26 DIAGNOSIS — Z66 Do not resuscitate: Secondary | ICD-10-CM | POA: Diagnosis present

## 2015-04-26 DIAGNOSIS — M810 Age-related osteoporosis without current pathological fracture: Secondary | ICD-10-CM | POA: Diagnosis present

## 2015-04-26 DIAGNOSIS — I10 Essential (primary) hypertension: Secondary | ICD-10-CM | POA: Diagnosis present

## 2015-04-26 DIAGNOSIS — D696 Thrombocytopenia, unspecified: Secondary | ICD-10-CM | POA: Diagnosis present

## 2015-04-26 DIAGNOSIS — L03119 Cellulitis of unspecified part of limb: Secondary | ICD-10-CM

## 2015-04-26 DIAGNOSIS — G629 Polyneuropathy, unspecified: Secondary | ICD-10-CM | POA: Diagnosis present

## 2015-04-26 DIAGNOSIS — Z8249 Family history of ischemic heart disease and other diseases of the circulatory system: Secondary | ICD-10-CM

## 2015-04-26 DIAGNOSIS — Z90710 Acquired absence of both cervix and uterus: Secondary | ICD-10-CM

## 2015-04-26 DIAGNOSIS — K7581 Nonalcoholic steatohepatitis (NASH): Secondary | ICD-10-CM | POA: Diagnosis present

## 2015-04-26 DIAGNOSIS — K5900 Constipation, unspecified: Secondary | ICD-10-CM | POA: Diagnosis present

## 2015-04-26 DIAGNOSIS — Z809 Family history of malignant neoplasm, unspecified: Secondary | ICD-10-CM

## 2015-04-26 DIAGNOSIS — Z96653 Presence of artificial knee joint, bilateral: Secondary | ICD-10-CM | POA: Diagnosis present

## 2015-04-26 DIAGNOSIS — Z886 Allergy status to analgesic agent status: Secondary | ICD-10-CM

## 2015-04-26 DIAGNOSIS — L03116 Cellulitis of left lower limb: Principal | ICD-10-CM | POA: Diagnosis present

## 2015-04-26 DIAGNOSIS — F419 Anxiety disorder, unspecified: Secondary | ICD-10-CM | POA: Diagnosis present

## 2015-04-26 DIAGNOSIS — E119 Type 2 diabetes mellitus without complications: Secondary | ICD-10-CM | POA: Diagnosis present

## 2015-04-26 DIAGNOSIS — M1990 Unspecified osteoarthritis, unspecified site: Secondary | ICD-10-CM | POA: Diagnosis present

## 2015-04-26 DIAGNOSIS — Z885 Allergy status to narcotic agent status: Secondary | ICD-10-CM

## 2015-04-26 DIAGNOSIS — F329 Major depressive disorder, single episode, unspecified: Secondary | ICD-10-CM | POA: Diagnosis present

## 2015-04-26 DIAGNOSIS — Z9049 Acquired absence of other specified parts of digestive tract: Secondary | ICD-10-CM

## 2015-04-26 DIAGNOSIS — Z888 Allergy status to other drugs, medicaments and biological substances status: Secondary | ICD-10-CM

## 2015-04-26 DIAGNOSIS — M1A9XX0 Chronic gout, unspecified, without tophus (tophi): Secondary | ICD-10-CM | POA: Diagnosis present

## 2015-04-26 DIAGNOSIS — L02419 Cutaneous abscess of limb, unspecified: Secondary | ICD-10-CM | POA: Diagnosis present

## 2015-04-26 DIAGNOSIS — E538 Deficiency of other specified B group vitamins: Secondary | ICD-10-CM | POA: Diagnosis present

## 2015-04-26 DIAGNOSIS — Z9842 Cataract extraction status, left eye: Secondary | ICD-10-CM

## 2015-04-26 DIAGNOSIS — Z9889 Other specified postprocedural states: Secondary | ICD-10-CM

## 2015-04-26 DIAGNOSIS — Z79899 Other long term (current) drug therapy: Secondary | ICD-10-CM

## 2015-04-26 DIAGNOSIS — F0390 Unspecified dementia without behavioral disturbance: Secondary | ICD-10-CM | POA: Diagnosis present

## 2015-04-26 DIAGNOSIS — Z9841 Cataract extraction status, right eye: Secondary | ICD-10-CM

## 2015-04-26 DIAGNOSIS — Z961 Presence of intraocular lens: Secondary | ICD-10-CM | POA: Diagnosis present

## 2015-04-26 LAB — CBC WITH DIFFERENTIAL/PLATELET
BASOS ABS: 0 10*3/uL (ref 0–0.1)
Basophils Relative: 1 %
Eosinophils Absolute: 0.1 10*3/uL (ref 0–0.7)
HEMATOCRIT: 31.9 % — AB (ref 35.0–47.0)
HEMOGLOBIN: 10.2 g/dL — AB (ref 12.0–16.0)
LYMPHS ABS: 0.7 10*3/uL — AB (ref 1.0–3.6)
MCH: 26.7 pg (ref 26.0–34.0)
MCHC: 31.9 g/dL — ABNORMAL LOW (ref 32.0–36.0)
MCV: 83.7 fL (ref 80.0–100.0)
Monocytes Absolute: 0.4 10*3/uL (ref 0.2–0.9)
Monocytes Relative: 12 %
NEUTROS ABS: 2.1 10*3/uL (ref 1.4–6.5)
PLATELETS: 62 10*3/uL — AB (ref 150–440)
RBC: 3.81 MIL/uL (ref 3.80–5.20)
RDW: 15.8 % — ABNORMAL HIGH (ref 11.5–14.5)
WBC: 3.3 10*3/uL — AB (ref 3.6–11.0)

## 2015-04-26 LAB — COMPREHENSIVE METABOLIC PANEL
ALK PHOS: 95 U/L (ref 38–126)
ALT: 17 U/L (ref 14–54)
AST: 29 U/L (ref 15–41)
Albumin: 3.6 g/dL (ref 3.5–5.0)
Anion gap: 10 (ref 5–15)
BUN: 15 mg/dL (ref 6–20)
CALCIUM: 8.7 mg/dL — AB (ref 8.9–10.3)
CHLORIDE: 103 mmol/L (ref 101–111)
CO2: 29 mmol/L (ref 22–32)
CREATININE: 1.2 mg/dL — AB (ref 0.44–1.00)
GFR, EST AFRICAN AMERICAN: 47 mL/min — AB (ref >60)
GFR, EST NON AFRICAN AMERICAN: 40 mL/min — AB (ref >60)
Glucose, Bld: 102 mg/dL — ABNORMAL HIGH (ref 65–99)
Potassium: 3.9 mmol/L (ref 3.5–5.1)
Sodium: 142 mmol/L (ref 135–145)
Total Bilirubin: 0.4 mg/dL (ref 0.3–1.2)
Total Protein: 6.8 g/dL (ref 6.5–8.1)

## 2015-04-26 MED ORDER — CLINDAMYCIN PHOSPHATE 600 MG/50ML IV SOLN
600.0000 mg | Freq: Once | INTRAVENOUS | Status: AC
  Administered 2015-04-26: 600 mg via INTRAVENOUS
  Filled 2015-04-26: qty 50

## 2015-04-26 NOTE — ED Notes (Signed)
Pt arrived to ED vis EMS from [facility] facility with reported redness and warmth to left lower leg. Reported pt had procedure to remove carcinoma from left lower leg. Pt sent for further evaluation of possible infection of wound on LLE.

## 2015-04-26 NOTE — ED Provider Notes (Signed)
[facility] Emergency Department Provider Note   ____________________________________________  Time seen: Approximately 10:30 PM I have reviewed the triage vital signs and the triage nursing note.  HISTORY  Chief Complaint Wound Infection   Historian Patient  HPI Veronica Stafford is a 82 y.o. 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 She followed up with a dermatologist office today and was found to have evidence of left lower extremity cellulitis. Patient was started on Ceftin year and had a dose this afternoon. Skin marker drawn around edges cellulitis at the dermatologist office afternoon. This evening this was reevaluated by staff at [facility] and found to have worsening redness down her ankle to her foot and also medially and up her shin towards her knee.  No reported fever or chills. No headache or nausea. No abdominal pain.    Past Medical History  Diagnosis Date  . NASH (nonalcoholic steatohepatitis)   . Vitamin B12 deficiency   . Psychosis     paranoid hallucinations  . Pancytopenia     Dr Danie Chandler infusions  . Arthritis   . Anxiety   . Hypertension   . GERD (gastroesophageal reflux disease)   . Diabetes mellitus   . Osteoporosis   . Depression   . Lumbar disc disease   . Wrist fracture, left 9/12    Patient Active Problem List   Diagnosis Date Noted  . Dermatophytosis of nail 06/28/2014  . Abdominal pain, generalized 03/29/2014  . Diabetes, polyneuropathy ([place]) 03/23/2013  . Ventral hernia 09/16/2012  . Osteoarthritis, knee 11/12/2011  . Lumbar disc disease with radiculopathy 10/23/2010  . Chronic recurrent major depressive disorder ([place])   . Pancytopenia ([place])   . Anxiety   . Hypertension   . GERD (gastroesophageal reflux disease)   . Type 2 diabetes mellitus with neurological manifestations, controlled ([place])   . Osteoporosis, unspecified   . Psychosis    . Vitamin B12 deficiency   . NASH (nonalcoholic steatohepatitis)     Past Surgical History  Procedure Laterality Date  . Joint replacement      TKR bilateral  . Elbow surgery      4 on each  . Mandible surgery      twice  . Abdominal hysterectomy    . Cholecystectomy    . Breast surgery      breast reduction  . Hemorrhoid surgery    . Right ear lesion removal    . Fixation kyphoplasty  1/12  . Cataract extraction w/ intraocular lens  implant, bilateral    . Revision of tkr  9/13    Cartilage replaced    Current Outpatient Rx  Name  Route  Sig  Dispense  Refill  . allopurinol (ZYLOPRIM) 300 MG tablet      TAKE 1 TABLET BY MOUTH ONCE DAILY FOR GOUT   30 tablet   11   . bisacodyl (DULCOLAX) 5 MG EC tablet   Oral   Take 5 mg by mouth every other day as needed for moderate constipation.         . clonazePAM (KLONOPIN) 0.5 MG tablet   Oral   Take 1 tablet (0.5 mg total) by mouth 3 (three) times daily as needed for anxiety.   90 tablet   5   . cyanocobalamin 1000 MCG tablet   Oral   Take 100 mcg by mouth daily.         Marland Kitchen  desonide (DESOWEN) 0.05 % ointment   Topical   Apply 1 application topically 3 (three) times daily as needed.         . ergocalciferol (VITAMIN D2) 50000 UNITS capsule   Oral   Take 50,000 Units by mouth every 30 (thirty) days.           Marland Kitchen FLUoxetine (PROZAC) 40 MG capsule   Oral   Take 1 capsule (40 mg total) by mouth daily.   30 capsule   11   . furosemide (LASIX) 80 MG tablet   Oral   Take 80 mg by mouth daily.           Marland Kitchen gabapentin (NEURONTIN) 300 MG capsule   Oral   Take 2 capsules (600 mg total) by mouth 3 (three) times daily. Dx: E11.42   180 capsule   11   . meclizine (ANTIVERT) 25 MG tablet   Oral   Take 25 mg by mouth 3 (three) times daily as needed for dizziness.         . Multiple Vitamins-Minerals (MULTIVITAMIN WITH MINERALS) tablet   Oral   Take 1 tablet by mouth daily.          Marland Kitchen omeprazole  (PRILOSEC) 20 MG capsule      TAKE ONE CAPSULE BY MOUTH TWICE A DAY. *DO NOT CRUSH*   60 capsule   11   . Pramox-PE-Glycerin-Petrolatum (PREPARATION H) 1-0.25-14.4-15 % CREA   Rectal   Place rectally.         . risperiDONE (RISPERDAL) 1 MG tablet   Oral   Take 2 tablets (2 mg total) by mouth at bedtime.   30 tablet   11   . sennosides-docusate sodium (SENOKOT-S) 8.6-50 MG tablet   Oral   Take 2 tablets by mouth 2 (two) times daily.          . traMADol (ULTRAM) 50 MG tablet   Oral   Take 2 tablets (100 mg total) by mouth 3 (three) times daily.   180 tablet   1   . zolpidem (AMBIEN) 10 MG tablet   Oral   Take 10 mg by mouth at bedtime as needed for sleep.           Allergies Codeine; Demerol; Erythromycin; Fentanyl; Flagyl; and Morphine and related  Family History  Problem Relation Age of Onset  . Cancer Mother     colon  . Heart disease Father     Social History Social History  Substance Use Topics  . Smoking status: Never Smoker   . Smokeless tobacco: Never Used  . Alcohol Use: No    Review of Systems  Constitutional: Negative for fever. Eyes: Negative for visual changes. ENT: Negative for sore throat. Cardiovascular: Negative for chest pain. Respiratory: Negative for shortness of breath. Gastrointestinal: Negative for abdominal pain, vomiting and diarrhea. Genitourinary: Negative for dysuria. Musculoskeletal: Negative for back pain. Skin: Negative for rash. Neurological: Negative for headache. 10 point Review of Systems otherwise negative ____________________________________________   PHYSICAL EXAM:  VITAL SIGNS: ED Triage Vitals  Enc Vitals Group     BP 04/26/15 2217 134/71 mmHg     Pulse Rate 04/26/15 2217 82     Resp --      Temp 04/26/15 2217 98.6 F (37 C)     Temp Source 04/26/15 2217 Oral     SpO2 04/26/15 2217 99 %     Weight 04/26/15 2217 196 lb (88.905 kg)     Height 04/26/15 2217  5\' 8"  (1.727 m)     Head Cir --       Peak Flow --      Pain Score 04/26/15 2218 7     Pain Loc --      Pain Edu? --      Excl. in [place]? --      Constitutional: Alert and oriented. Well appearing and in no distress. HEENT   Head: Normocephalic and atraumatic.      Eyes: Conjunctivae are normal. PERRL. Normal extraocular movements.      Ears:         Nose: No congestion/rhinnorhea.   Mouth/Throat: Mucous membranes are moist.   Neck: No stridor. Cardiovascular/Chest: Normal rate, regular rhythm.  No murmurs, rubs, or gallops. Respiratory: Normal respiratory effort without tachypnea nor retractions. Breath sounds are clear and equal bilaterally. No wheezes/rales/rhonchi. Gastrointestinal: Soft. No distention, no guarding, no rebound. Nontender.    Genitourinary/rectal:Deferred Musculoskeletal: Left lower extremity 1+ pitting edema. Pocket punch a healing posterior calf. Cellulitis involving the right ankle shin and calf. No specific calf tenderness. Neurologic:  Normal speech and language. No gross or focal neurologic deficits are appreciated. Skin:  Skin is warm, dry and intact. No rash noted. Psychiatric: Mood and affect are normal. Speech and behavior are normal. Patient exhibits appropriate insight and judgment.  ____________________________________________   EKG I, Lisa Roca, MD, the attending physician have personally viewed and interpreted all ECGs.  None ____________________________________________  LABS (pertinent positives/negatives)  White blood count 3.3, hemoglobin 10.2 and platelet count 62 Conference metabolic panel pending  ____________________________________________  RADIOLOGY All Xrays were viewed by me. Imaging interpreted by Radiologist.  None __________________________________________  PROCEDURES  Procedure(s) performed: None  Critical Care performed: None  ____________________________________________   ED COURSE / ASSESSMENT AND PLAN  Pertinent labs & imaging results  that were available during my care of the patient were reviewed by me and considered in my medical decision making (see chart for details).   Patient has a cellulitis that seems worse since this afternoon and encompassing a fair amount of her leg from the knee down.  I will add clindamycin IV coverage for MRSA. I do think given the amount of cellulitis she needs observation to make sure she is responding before discharge home.    CONSULTATIONS:   Hospitalist for admission.   Patient / Family / Caregiver informed of clinical course, medical decision-making process, and agree with plan.     ___________________________________________   FINAL CLINICAL IMPRESSION(S) / ED DIAGNOSES   Final diagnoses:  Cellulitis of left lower extremity              Note: This dictation was prepared with Dragon dictation. Any transcriptional errors that result from this process are unintentional   Lisa Roca, MD 04/26/15 2324

## 2015-04-27 ENCOUNTER — Encounter: Payer: Self-pay | Admitting: Internal Medicine

## 2015-04-27 DIAGNOSIS — F329 Major depressive disorder, single episode, unspecified: Secondary | ICD-10-CM | POA: Diagnosis present

## 2015-04-27 DIAGNOSIS — M1A9XX0 Chronic gout, unspecified, without tophus (tophi): Secondary | ICD-10-CM | POA: Diagnosis present

## 2015-04-27 DIAGNOSIS — Z66 Do not resuscitate: Secondary | ICD-10-CM | POA: Diagnosis present

## 2015-04-27 DIAGNOSIS — Z96653 Presence of artificial knee joint, bilateral: Secondary | ICD-10-CM | POA: Diagnosis present

## 2015-04-27 DIAGNOSIS — L03119 Cellulitis of unspecified part of limb: Secondary | ICD-10-CM

## 2015-04-27 DIAGNOSIS — Z809 Family history of malignant neoplasm, unspecified: Secondary | ICD-10-CM | POA: Diagnosis not present

## 2015-04-27 DIAGNOSIS — Z90710 Acquired absence of both cervix and uterus: Secondary | ICD-10-CM | POA: Diagnosis not present

## 2015-04-27 DIAGNOSIS — Z885 Allergy status to narcotic agent status: Secondary | ICD-10-CM | POA: Diagnosis not present

## 2015-04-27 DIAGNOSIS — Z8249 Family history of ischemic heart disease and other diseases of the circulatory system: Secondary | ICD-10-CM | POA: Diagnosis not present

## 2015-04-27 DIAGNOSIS — L02419 Cutaneous abscess of limb, unspecified: Secondary | ICD-10-CM | POA: Diagnosis present

## 2015-04-27 DIAGNOSIS — E119 Type 2 diabetes mellitus without complications: Secondary | ICD-10-CM | POA: Diagnosis present

## 2015-04-27 DIAGNOSIS — Z961 Presence of intraocular lens: Secondary | ICD-10-CM | POA: Diagnosis present

## 2015-04-27 DIAGNOSIS — M810 Age-related osteoporosis without current pathological fracture: Secondary | ICD-10-CM | POA: Diagnosis present

## 2015-04-27 DIAGNOSIS — E538 Deficiency of other specified B group vitamins: Secondary | ICD-10-CM | POA: Diagnosis present

## 2015-04-27 DIAGNOSIS — Z9842 Cataract extraction status, left eye: Secondary | ICD-10-CM | POA: Diagnosis not present

## 2015-04-27 DIAGNOSIS — M1990 Unspecified osteoarthritis, unspecified site: Secondary | ICD-10-CM | POA: Diagnosis present

## 2015-04-27 DIAGNOSIS — K219 Gastro-esophageal reflux disease without esophagitis: Secondary | ICD-10-CM | POA: Diagnosis present

## 2015-04-27 DIAGNOSIS — Z79899 Other long term (current) drug therapy: Secondary | ICD-10-CM | POA: Diagnosis not present

## 2015-04-27 DIAGNOSIS — G629 Polyneuropathy, unspecified: Secondary | ICD-10-CM | POA: Diagnosis present

## 2015-04-27 DIAGNOSIS — K5900 Constipation, unspecified: Secondary | ICD-10-CM | POA: Diagnosis present

## 2015-04-27 DIAGNOSIS — Z886 Allergy status to analgesic agent status: Secondary | ICD-10-CM | POA: Diagnosis not present

## 2015-04-27 DIAGNOSIS — Z9889 Other specified postprocedural states: Secondary | ICD-10-CM | POA: Diagnosis not present

## 2015-04-27 DIAGNOSIS — Z9841 Cataract extraction status, right eye: Secondary | ICD-10-CM | POA: Diagnosis not present

## 2015-04-27 DIAGNOSIS — F419 Anxiety disorder, unspecified: Secondary | ICD-10-CM | POA: Diagnosis present

## 2015-04-27 DIAGNOSIS — L03116 Cellulitis of left lower limb: Secondary | ICD-10-CM | POA: Diagnosis present

## 2015-04-27 DIAGNOSIS — K7581 Nonalcoholic steatohepatitis (NASH): Secondary | ICD-10-CM | POA: Diagnosis present

## 2015-04-27 DIAGNOSIS — D696 Thrombocytopenia, unspecified: Secondary | ICD-10-CM | POA: Diagnosis present

## 2015-04-27 DIAGNOSIS — Z888 Allergy status to other drugs, medicaments and biological substances status: Secondary | ICD-10-CM | POA: Diagnosis not present

## 2015-04-27 DIAGNOSIS — I10 Essential (primary) hypertension: Secondary | ICD-10-CM | POA: Diagnosis present

## 2015-04-27 DIAGNOSIS — F0390 Unspecified dementia without behavioral disturbance: Secondary | ICD-10-CM | POA: Diagnosis present

## 2015-04-27 DIAGNOSIS — Z9049 Acquired absence of other specified parts of digestive tract: Secondary | ICD-10-CM | POA: Diagnosis not present

## 2015-04-27 LAB — BASIC METABOLIC PANEL
Anion gap: 5 (ref 5–15)
BUN: 15 mg/dL (ref 6–20)
CALCIUM: 8.3 mg/dL — AB (ref 8.9–10.3)
CHLORIDE: 103 mmol/L (ref 101–111)
CO2: 32 mmol/L (ref 22–32)
Creatinine, Ser: 1.12 mg/dL — ABNORMAL HIGH (ref 0.44–1.00)
GFR calc non Af Amer: 44 mL/min — ABNORMAL LOW (ref >60)
GFR, EST AFRICAN AMERICAN: 51 mL/min — AB (ref >60)
GLUCOSE: 113 mg/dL — AB (ref 65–99)
Potassium: 3.4 mmol/L — ABNORMAL LOW (ref 3.5–5.1)
Sodium: 140 mmol/L (ref 135–145)

## 2015-04-27 LAB — CBC
HEMATOCRIT: 28.6 % — AB (ref 35.0–47.0)
HEMOGLOBIN: 9.2 g/dL — AB (ref 12.0–16.0)
MCH: 26.8 pg (ref 26.0–34.0)
MCHC: 32.3 g/dL (ref 32.0–36.0)
MCV: 83 fL (ref 80.0–100.0)
Platelets: 53 10*3/uL — ABNORMAL LOW (ref 150–440)
RBC: 3.45 MIL/uL — ABNORMAL LOW (ref 3.80–5.20)
RDW: 15.5 % — AB (ref 11.5–14.5)
WBC: 2.9 10*3/uL — ABNORMAL LOW (ref 3.6–11.0)

## 2015-04-27 LAB — MRSA PCR SCREENING: MRSA by PCR: NEGATIVE

## 2015-04-27 MED ORDER — MUPIROCIN 2 % EX OINT
1.0000 "application " | TOPICAL_OINTMENT | Freq: Two times a day (BID) | CUTANEOUS | Status: DC
  Administered 2015-04-27 – 2015-04-28 (×3): 1 via NASAL
  Filled 2015-04-27: qty 22

## 2015-04-27 MED ORDER — CLONAZEPAM 0.5 MG PO TABS
0.5000 mg | ORAL_TABLET | Freq: Three times a day (TID) | ORAL | Status: DC | PRN
  Administered 2015-04-27: 0.5 mg via ORAL
  Filled 2015-04-27: qty 1

## 2015-04-27 MED ORDER — FUROSEMIDE 40 MG PO TABS
80.0000 mg | ORAL_TABLET | Freq: Every day | ORAL | Status: DC
  Administered 2015-04-27 – 2015-04-28 (×2): 80 mg via ORAL
  Filled 2015-04-27 (×2): qty 2

## 2015-04-27 MED ORDER — ACETAMINOPHEN 325 MG PO TABS: 650.0000 mg | ORAL_TABLET | Freq: Four times a day (QID) | ORAL | Status: DC | PRN

## 2015-04-27 MED ORDER — PANTOPRAZOLE SODIUM 40 MG PO TBEC
40.0000 mg | DELAYED_RELEASE_TABLET | Freq: Every day | ORAL | Status: DC
  Administered 2015-04-27 – 2015-04-28 (×2): 40 mg via ORAL
  Filled 2015-04-27 (×2): qty 1

## 2015-04-27 MED ORDER — BISACODYL 5 MG PO TBEC: 5.0000 mg | DELAYED_RELEASE_TABLET | ORAL | Status: DC | PRN

## 2015-04-27 MED ORDER — SENNOSIDES-DOCUSATE SODIUM 8.6-50 MG PO TABS
2.0000 | ORAL_TABLET | Freq: Two times a day (BID) | ORAL | Status: DC
  Administered 2015-04-27 – 2015-04-28 (×3): 2 via ORAL
  Filled 2015-04-27 (×3): qty 2

## 2015-04-27 MED ORDER — VITAMIN D (ERGOCALCIFEROL) 1.25 MG (50000 UNIT) PO CAPS: 50000.0000 [IU] | ORAL_CAPSULE | ORAL | Status: DC

## 2015-04-27 MED ORDER — SODIUM CHLORIDE 0.9% FLUSH
3.0000 mL | Freq: Two times a day (BID) | INTRAVENOUS | Status: DC
  Administered 2015-04-27 (×3): 3 mL via INTRAVENOUS

## 2015-04-27 MED ORDER — MECLIZINE HCL 25 MG PO TABS
25.0000 mg | ORAL_TABLET | Freq: Three times a day (TID) | ORAL | Status: DC | PRN
  Filled 2015-04-27: qty 1

## 2015-04-27 MED ORDER — VITAMIN B-12 100 MCG PO TABS
100.0000 ug | ORAL_TABLET | Freq: Every day | ORAL | Status: DC
  Administered 2015-04-27 – 2015-04-28 (×2): 100 ug via ORAL
  Filled 2015-04-27 (×2): qty 1

## 2015-04-27 MED ORDER — MAGNESIUM HYDROXIDE 400 MG/5ML PO SUSP: 30.0000 mL | Freq: Every day | ORAL | Status: DC | PRN

## 2015-04-27 MED ORDER — SODIUM CHLORIDE 0.9 % IV SOLN: 250.0000 mL | INTRAVENOUS | Status: DC | PRN

## 2015-04-27 MED ORDER — ONDANSETRON HCL 4 MG PO TABS: 4.0000 mg | ORAL_TABLET | Freq: Four times a day (QID) | ORAL | Status: DC | PRN

## 2015-04-27 MED ORDER — TRAMADOL HCL 50 MG PO TABS
100.0000 mg | ORAL_TABLET | Freq: Three times a day (TID) | ORAL | Status: DC
  Administered 2015-04-27 – 2015-04-28 (×4): 100 mg via ORAL
  Filled 2015-04-27 (×4): qty 2

## 2015-04-27 MED ORDER — FLUOXETINE HCL 20 MG PO CAPS
40.0000 mg | ORAL_CAPSULE | Freq: Every day | ORAL | Status: DC
  Administered 2015-04-27 – 2015-04-28 (×2): 40 mg via ORAL
  Filled 2015-04-27 (×2): qty 2

## 2015-04-27 MED ORDER — SODIUM CHLORIDE 0.9% FLUSH: 3.0000 mL | INTRAVENOUS | Status: DC | PRN

## 2015-04-27 MED ORDER — GABAPENTIN 300 MG PO CAPS
600.0000 mg | ORAL_CAPSULE | Freq: Three times a day (TID) | ORAL | Status: DC
  Administered 2015-04-27 – 2015-04-28 (×4): 600 mg via ORAL
  Filled 2015-04-27 (×4): qty 2

## 2015-04-27 MED ORDER — ACETAMINOPHEN 650 MG RE SUPP
650.0000 mg | Freq: Four times a day (QID) | RECTAL | Status: DC | PRN
Start: 2015-04-27 — End: 2015-04-28

## 2015-04-27 MED ORDER — ALLOPURINOL 300 MG PO TABS
300.0000 mg | ORAL_TABLET | Freq: Every day | ORAL | Status: DC
  Administered 2015-04-27 – 2015-04-28 (×2): 300 mg via ORAL
  Filled 2015-04-27 (×2): qty 1

## 2015-04-27 MED ORDER — RISPERIDONE 0.5 MG PO TABS
2.0000 mg | ORAL_TABLET | Freq: Every day | ORAL | Status: DC
  Administered 2015-04-27: 2 mg via ORAL
  Filled 2015-04-27: qty 4

## 2015-04-27 MED ORDER — ADULT MULTIVITAMIN W/MINERALS CH
1.0000 | ORAL_TABLET | Freq: Every day | ORAL | Status: DC
  Administered 2015-04-27 – 2015-04-28 (×2): 1 via ORAL
  Filled 2015-04-27 (×2): qty 1

## 2015-04-27 MED ORDER — CEFUROXIME AXETIL 500 MG PO TABS
500.0000 mg | ORAL_TABLET | Freq: Two times a day (BID) | ORAL | Status: DC
  Filled 2015-04-27: qty 1

## 2015-04-27 MED ORDER — ONDANSETRON HCL 4 MG/2ML IJ SOLN: 4.0000 mg | Freq: Four times a day (QID) | INTRAMUSCULAR | Status: DC | PRN

## 2015-04-27 MED ORDER — ZOLPIDEM TARTRATE 5 MG PO TABS
10.0000 mg | ORAL_TABLET | Freq: Every evening | ORAL | Status: DC | PRN
  Administered 2015-04-27: 10 mg via ORAL
  Filled 2015-04-27: qty 2

## 2015-04-27 MED ORDER — CLINDAMYCIN PHOSPHATE 600 MG/50ML IV SOLN
600.0000 mg | Freq: Three times a day (TID) | INTRAVENOUS | Status: DC
  Administered 2015-04-27 – 2015-04-28 (×4): 600 mg via INTRAVENOUS
  Filled 2015-04-27 (×6): qty 50

## 2015-04-27 NOTE — ED Notes (Signed)
Consult MD at bedside.

## 2015-04-27 NOTE — Progress Notes (Signed)
[place] at [place] NAME: Veronica Stafford    MR#:  [id number]  DATE OF BIRTH:  05-23-1928  SUBJECTIVE: Admitted this morning for left leg cellulitis. redness is better. No fever.   CHIEF COMPLAINT:   Chief Complaint  Patient presents with  . Wound Infection    REVIEW OF SYSTEMS:    Review of Systems  Constitutional: Negative for fever and chills.  HENT: Negative for hearing loss.   Eyes: Negative for blurred vision, double vision and photophobia.  Respiratory: Negative for cough, hemoptysis and shortness of breath.   Cardiovascular: Positive for leg swelling. Negative for palpitations and orthopnea.       Left leg slightly red. Has dressing present.  Gastrointestinal: Negative for vomiting, abdominal pain and diarrhea.  Genitourinary: Negative for dysuria and urgency.  Musculoskeletal: Negative for myalgias and neck pain.  Skin: Negative for rash.  Neurological: Negative for dizziness, focal weakness, seizures, weakness and headaches.  Psychiatric/Behavioral: Negative for memory loss. The patient does not have insomnia.     Nutrition:  Tolerating Diet: Tolerating PT:      DRUG ALLERGIES:   Allergies  Allergen Reactions  . Codeine   . Demerol   . Erythromycin   . Fentanyl   . Flagyl [Metronidazole Hcl]   . Morphine And Related     VITALS:  Blood pressure 126/58, pulse 74, temperature 98.3 F (36.8 C), temperature source Oral, resp. rate 16, height 5\' 8"  (1.727 m), weight 87.227 kg (192 lb 4.8 oz), SpO2 97 %.  PHYSICAL EXAMINATION:   Physical Exam  GENERAL:  82 y.o.-year-old patient lying in the bed with no acute distress.  EYES: Pupils equal, round, reactive to light and accommodation. No scleral icterus. Extraocular muscles intact.  HEENT: Head atraumatic, normocephalic. Oropharynx and nasopharynx clear.  NECK:  Supple, no jugular venous distention. No thyroid enlargement, no tenderness.  LUNGS: Normal breath  sounds bilaterally, no wheezing, rales,rhonchi or crepitation. No use of accessory muscles of respiration.  CARDIOVASCULAR: S1, S2 normal. No murmurs, rubs, or gallops.  ABDOMEN: Soft, nontender, nondistended. Bowel sounds present. No organomegaly or mass.  EXTREMITIES: Decreased redness around the biopsy site, ankle. No tenderness. NEUROLOGIC: Cranial nerves II through XII are intact. Muscle strength 5/5 in all extremities. Sensation intact. Gait not checked.  PSYCHIATRIC: The patient is alert and oriented x 3.  SKIN: No obvious rash, lesion, or ulcer.    LABORATORY PANEL:   CBC  Recent Labs Lab 04/27/15 0339  WBC 2.9*  HGB 9.2*  HCT 28.6*  PLT 53*   ------------------------------------------------------------------------------------------------------------------  Chemistries   Recent Labs Lab 04/26/15 2227 04/27/15 0339  NA 142 140  K 3.9 3.4*  CL 103 103  CO2 29 32  GLUCOSE 102* 113*  BUN 15 15  CREATININE 1.20* 1.12*  CALCIUM 8.7* 8.3*  AST 29  --   ALT 17  --   ALKPHOS 95  --   BILITOT 0.4  --    ------------------------------------------------------------------------------------------------------------------  Cardiac Enzymes No results for input(s): TROPONINI in the last 168 hours. ------------------------------------------------------------------------------------------------------------------  RADIOLOGY:  No results found.   ASSESSMENT AND PLAN:   Active Problems:   Cellulitis and abscess of leg   #1. Left leg cellulitis: Patient received IV clindamycin in the emergency room, and continue clindamycin for now, will likely discharge tomorrow home with clindamycin or doxycycline. 2 chronic gout; continue allopurinol. #3 anxiety continue Klonopin. #4 history of neuropathy: Continue Neurontin. #5 chronic thrombocytopenia: Stable. #6. History of  fall and a TSH with chronic thrombocytopenia. #7 depression: Continue SSRIs.    All the records are  reviewed and case discussed with Care Management/Social Workerr. Management plans discussed with the patient, family and they are in agreement.  CODE STATUS: DNR  TOTAL TIME TAKING CARE OF THIS PATIENT: 20 minutes.   POSSIBLE D/C IN 1-2DAYS, DEPENDING ON CLINICAL CONDITION.   Epifanio Lesches M.D on 04/27/2015 at 8:00 AM  Between 7am to 6pm - Pager - [phone]  After 6pm go to [email or web address] - password [email or web address] [place] [facility]  Office  [phone]  CC: Primary care physician; Viviana Simpler, MD

## 2015-04-27 NOTE — H&P (Addendum)
[place] at [place] NAME: Veronica Stafford    MR#:  [id number]  DATE OF BIRTH:  June 01, 1928  DATE OF ADMISSION:  04/26/2015  PRIMARY CARE PHYSICIAN: Viviana Simpler, MD   REQUESTING/REFERRING PHYSICIAN:   CHIEF COMPLAINT:   Chief Complaint  Patient presents with  . Wound Infection    HISTORY OF PRESENT ILLNESS: Veronica Stafford  is a 82 y.o. female with a known history of normalcoholic steatohepatitis, arthritis, hypertension, GERD, diabetes mellitus presented to the emergency room after being referred from [facility]. Patient had a excision biopsy in the left calf for a for a growth(tumor) on February 6. Patient says it is a carcinoma according to her physician. Yesterday she saw her physician in the clinic, after returning back to assisted living facility she noticed some redness around the excision site. There is also tenderness and warmth the left cough and left lower leg. The pain is aching in nature 6 out of 10 on a scale of 1-10. Patient currently on oral Ceftin antibiotic at the facility. No purulent discharge noted. No fever or chills. No complaints of chest pain or shortness of breath. Was given clindamycin antibiotic in the emergency room and hospitalist service was consulted.  PAST MEDICAL HISTORY:   Past Medical History  Diagnosis Date  . NASH (nonalcoholic steatohepatitis)   . Vitamin B12 deficiency   . Psychosis     paranoid hallucinations  . Pancytopenia     Dr Danie Chandler infusions  . Arthritis   . Anxiety   . Hypertension   . GERD (gastroesophageal reflux disease)   . Diabetes mellitus   . Osteoporosis   . Depression   . Lumbar disc disease   . Wrist fracture, left 9/12    PAST SURGICAL HISTORY: Past Surgical History  Procedure Laterality Date  . Joint replacement      TKR bilateral  . Elbow surgery      4 on each  . Mandible surgery      twice  . Abdominal hysterectomy    .  Cholecystectomy    . Breast surgery      breast reduction  . Hemorrhoid surgery    . Right ear lesion removal    . Fixation kyphoplasty  1/12  . Cataract extraction w/ intraocular lens  implant, bilateral    . Revision of tkr  9/13    Cartilage replaced    SOCIAL HISTORY:  Social History  Substance Use Topics  . Smoking status: Never Smoker   . Smokeless tobacco: Never Used  . Alcohol Use: No    FAMILY HISTORY:  Family History  Problem Relation Age of Onset  . Cancer Mother     colon  . Heart disease Father     DRUG ALLERGIES:  Allergies  Allergen Reactions  . Codeine   . Demerol   . Erythromycin   . Fentanyl   . Flagyl [Metronidazole Hcl]   . Morphine And Related     REVIEW OF SYSTEMS:   CONSTITUTIONAL: No fever, fatigue or weakness.  EYES: No blurred or double vision.  EARS, NOSE, AND THROAT: No tinnitus or ear pain.  RESPIRATORY: No cough, shortness of breath, wheezing or hemoptysis.  CARDIOVASCULAR: No chest pain, orthopnea, edema.  GASTROINTESTINAL: No nausea, vomiting, diarrhea or abdominal pain.  GENITOURINARY: No dysuria, hematuria.  ENDOCRINE: No polyuria, nocturia,  HEMATOLOGY: No anemia, easy bruising or bleeding SKIN: redness noted over the left lower leg  MUSCULOSKELETAL:  tenderness and warmth left lower leg.  NEUROLOGIC: No tingling, numbness, weakness.  PSYCHIATRY: No anxiety or depression.   MEDICATIONS AT HOME:  Prior to Admission medications   Medication Sig Start Date End Date Taking? Authorizing Provider  acetaminophen (TYLENOL) 325 MG tablet Take 325-350 mg by mouth every 4 (four) hours as needed.   Yes Historical Provider, MD  allopurinol (ZYLOPRIM) 300 MG tablet TAKE 1 TABLET BY MOUTH ONCE DAILY FOR GOUT 01/08/14  Yes Venia Carbon, MD  bisacodyl (DULCOLAX) 5 MG EC tablet Take 5 mg by mouth every other day as needed for moderate constipation.   Yes Historical Provider, MD  cefdinir (OMNICEF) 300 MG capsule Take 300 mg by mouth 2  (two) times daily. 04/26/15 05/05/15 Yes Historical Provider, MD  clonazePAM (KLONOPIN) 0.5 MG tablet Take 1 tablet (0.5 mg total) by mouth 3 (three) times daily as needed for anxiety. 06/23/13  Yes Lucille Passy, MD  cyanocobalamin 1000 MCG tablet Take 100 mcg by mouth daily.   Yes Historical Provider, MD  desonide (DESOWEN) 0.05 % ointment Apply 1 application topically 3 (three) times daily as needed.   Yes Historical Provider, MD  ergocalciferol (VITAMIN D2) 50000 UNITS capsule Take 50,000 Units by mouth every 30 (thirty) days.     Yes Historical Provider, MD  FLUoxetine (PROZAC) 40 MG capsule Take 1 capsule (40 mg total) by mouth daily. 11/12/10  Yes Venia Carbon, MD  furosemide (LASIX) 80 MG tablet Take 80 mg by mouth daily.     Yes Historical Provider, MD  gabapentin (NEURONTIN) 300 MG capsule Take 2 capsules (600 mg total) by mouth 3 (three) times daily. Dx: E11.42 03/07/15  Yes Venia Carbon, MD  magnesium hydroxide (MILK OF MAGNESIA) 400 MG/5ML suspension Take 30 mLs by mouth daily as needed for mild constipation.   Yes Historical Provider, MD  meclizine (ANTIVERT) 25 MG tablet Take 25 mg by mouth 3 (three) times daily as needed for dizziness.   Yes Historical Provider, MD  Multiple Vitamins-Minerals (MULTIVITAMIN WITH MINERALS) tablet Take 1 tablet by mouth daily.    Yes Historical Provider, MD  mupirocin ointment (BACTROBAN) 2 % Place 1 application into the nose 2 (two) times daily.   Yes Historical Provider, MD  omeprazole (PRILOSEC) 20 MG capsule TAKE ONE CAPSULE BY MOUTH TWICE A DAY. *DO NOT CRUSH* 01/08/14  Yes Venia Carbon, MD  risperiDONE (RISPERDAL) 1 MG tablet Take 2 tablets (2 mg total) by mouth at bedtime. 09/16/10  Yes Venia Carbon, MD  sennosides-docusate sodium (SENOKOT-S) 8.6-50 MG tablet Take 2 tablets by mouth 2 (two) times daily.    Yes Historical Provider, MD  traMADol (ULTRAM) 50 MG tablet Take 2 tablets (100 mg total) by mouth 3 (three) times daily. 11/16/12  Yes  Venia Carbon, MD  zolpidem (AMBIEN) 10 MG tablet Take 10 mg by mouth at bedtime as needed for sleep.   Yes Historical Provider, MD      PHYSICAL EXAMINATION:   VITAL SIGNS: Blood pressure 134/71, pulse 82, temperature 98.6 F (37 C), temperature source Oral, height 5\' 8"  (1.727 m), weight 88.905 kg (196 lb), SpO2 99 %.  GENERAL:  82 y.o.-year-old patient lying in the bed with no acute distress.  EYES: Pupils equal, round, reactive to light and accommodation. No scleral icterus. Extraocular muscles intact.  HEENT: Head atraumatic, normocephalic. Oropharynx and nasopharynx clear.  NECK:  Supple, no jugular venous distention. No thyroid enlargement, no tenderness.  LUNGS: Normal breath sounds bilaterally,  no wheezing, rales,rhonchi or crepitation. No use of accessory muscles of respiration.  CARDIOVASCULAR: S1, S2 normal. No murmurs, rubs, or gallops.  ABDOMEN: Soft, nontender, nondistended. Bowel sounds present. No organomegaly or mass.  EXTREMITIES: No pedal edema, cyanosis, or clubbing. Redness 10/6 cm over the left lower leg above ankle.Tenderness noted. NEUROLOGIC: Cranial nerves II through XII are intact. Muscle strength 5/5 in all extremities. Sensation intact. Gait normal.  PSYCHIATRIC: The patient is alert and oriented x 3.  SKIN: redness noted over the left lower leg above ankle.warm and tender.Excision site covered with bandage.   LABORATORY PANEL:   CBC  Recent Labs Lab 04/26/15 2227  WBC 3.3*  HGB 10.2*  HCT 31.9*  PLT 62*  MCV 83.7  MCH 26.7  MCHC 31.9*  RDW 15.8*  LYMPHSABS 0.7*  MONOABS 0.4  EOSABS 0.1  BASOSABS 0.0   ------------------------------------------------------------------------------------------------------------------  Chemistries   Recent Labs Lab 04/26/15 2227  NA 142  K 3.9  CL 103  CO2 29  GLUCOSE 102*  BUN 15  CREATININE 1.20*  CALCIUM 8.7*  AST 29  ALT 17  ALKPHOS 95  BILITOT 0.4    ------------------------------------------------------------------------------------------------------------------ estimated creatinine clearance is 40.7 mL/min (by C-G formula based on Cr of 1.2). ------------------------------------------------------------------------------------------------------------------ No results for input(s): TSH, T4TOTAL, T3FREE, THYROIDAB in the last 72 hours.  Invalid input(s): FREET3   Coagulation profile No results for input(s): INR, PROTIME in the last 168 hours. ------------------------------------------------------------------------------------------------------------------- No results for input(s): DDIMER in the last 72 hours. -------------------------------------------------------------------------------------------------------------------  Cardiac Enzymes No results for input(s): CKMB, TROPONINI, MYOGLOBIN in the last 168 hours.  Invalid input(s): CK ------------------------------------------------------------------------------------------------------------------ Invalid input(s): POCBNP  ---------------------------------------------------------------------------------------------------------------  Urinalysis No results found for: COLORURINE, APPEARANCEUR, LABSPEC, PHURINE, GLUCOSEU, HGBUR, BILIRUBINUR, KETONESUR, PROTEINUR, UROBILINOGEN, NITRITE, LEUKOCYTESUR   RADIOLOGY: No results found.  EKG: Orders placed or performed in visit on 11/19/11  . EKG 12-Lead    IMPRESSION AND PLAN: 82 year old female patient resident of assisted living facility with history of Karlene Lineman, pancytopenia, diabetes mellitus, hypertension, osteoporosis was referred for redness and tenderness in the left lower extremity. Admitting diagnosis 1. Left leg cellulitis 2. Chronic thrombocytopenia 3. Type 2 diabetes mellitus 4. Hypertension Treatment plan Continue oral Omnicef antibiotic and start patient on IV clindamycin antibiotic Cultures follow-up Follow-up  platelet count Pain Management Avoid blood thinner meds secondary to thrombocytopenia. Supportive care All the records are reviewed and case discussed with ED provider. Management plans discussed with the patient, family and they are in agreement.  CODE STATUS:DNR Code Status History    This patient does not have a recorded code status. Please follow your organizational policy for patients in this situation.    Advance Directive Documentation        Most Recent Value   Type of Advance Directive  Healthcare Power of Attorney   Pre-existing out of facility DNR order (yellow form or pink MOST form)     "MOST" Form in Place?         TOTAL TIME TAKING CARE OF THIS PATIENT: 50 minutes.    Saundra Shelling M.D on 04/27/2015 at 12:20 AM  Between 7am to 6pm - Pager - [phone]  After 6pm go to [email or web address] - password [email or web address] [place] [facility]  Office  [phone]  CC: Primary care physician; Viviana Simpler, MD

## 2015-04-28 MED ORDER — CLINDAMYCIN HCL 300 MG PO CAPS: 300.0000 mg | ORAL_CAPSULE | Freq: Three times a day (TID) | ORAL | Status: DC

## 2015-04-28 NOTE — NC FL2 (Signed)
[place] LEVEL OF CARE SCREENING TOOL     IDENTIFICATION  Patient Name: Veronica Stafford Birthdate: 1928/08/31 Sex: female Admission Date (Current Location): 04/26/2015  [place] and [place] Number:  Engineering geologist and Address:  [facility], [address], [place], [place] [address]      Provider Number: [id number]  Attending Physician Name and Address:  Epifanio Lesches, MD  Relative Name and Phone Number:   Radwa Fellinger [phone] )    Current Level of Care: Hospital Recommended Level of Care: [place] Prior Approval Number:    Date Approved/Denied:   PASRR Number:  ([id number])  Discharge Plan: Other (Comment) [facility])    Current Diagnoses: Patient Active Problem List   Diagnosis Date Noted  . Cellulitis and abscess of leg 04/27/2015  . Dermatophytosis of nail 06/28/2014  . Abdominal pain, generalized 03/29/2014  . Diabetes, polyneuropathy ([place]) 03/23/2013  . Ventral hernia 09/16/2012  . Osteoarthritis, knee 11/12/2011  . Lumbar disc disease with radiculopathy 10/23/2010  . Chronic recurrent major depressive disorder ([place])   . Pancytopenia ([place])   . Anxiety   . Hypertension   . GERD (gastroesophageal reflux disease)   . Type 2 diabetes mellitus with neurological manifestations, controlled ([place])   . Osteoporosis, unspecified   . Psychosis   . Vitamin B12 deficiency   . NASH (nonalcoholic steatohepatitis)     Orientation RESPIRATION BLADDER Height & Weight     Self, Time, Situation, Place  Normal Continent Weight: 192 lb 4.8 oz (87.227 kg) Height:  5\' 8"  (172.7 cm)  BEHAVIORAL SYMPTOMS/MOOD NEUROLOGICAL BOWEL NUTRITION STATUS      Incontinent    AMBULATORY STATUS COMMUNICATION OF NEEDS Skin   Limited Assist Verbally Other (Comment), Surgical wounds (Carcinoma removed from left leg)                       Personal Care Assistance Level of Assistance  Bathing, Dressing Bathing  Assistance: Limited assistance   Dressing Assistance: Limited assistance     Functional Limitations Info             SPECIAL CARE FACTORS FREQUENCY                       Contractures Contractures Info: Not present    Additional Factors Info  Code Status Code Status Info:  (DNR)             Current Medications (04/28/2015):  This is the current hospital active medication list Current Facility-Administered Medications  Medication Dose Route Frequency Provider Last Rate Last Dose  . 0.9 %  sodium chloride infusion  250 mL Intravenous PRN Saundra Shelling, MD      . acetaminophen (TYLENOL) tablet 650 mg  650 mg Oral Q6H PRN Saundra Shelling, MD       Or  . acetaminophen (TYLENOL) suppository 650 mg  650 mg Rectal Q6H PRN Saundra Shelling, MD      . allopurinol (ZYLOPRIM) tablet 300 mg  300 mg Oral Daily Saundra Shelling, MD   300 mg at 04/27/15 0820  . bisacodyl (DULCOLAX) EC tablet 5 mg  5 mg Oral Q48H PRN Saundra Shelling, MD      . clindamycin (CLEOCIN) IVPB 600 mg  600 mg Intravenous 3 times per day Saundra Shelling, MD 100 mL/hr at 04/28/15 0557 600 mg at 04/28/15 0557  . clonazePAM (KLONOPIN) tablet 0.5 mg  0.5 mg Oral TID PRN Reatha Harps  Pyreddy, MD   0.5 mg at 04/27/15 2108  . FLUoxetine (PROZAC) capsule 40 mg  40 mg Oral Daily Saundra Shelling, MD   40 mg at 04/27/15 [id number]  . furosemide (LASIX) tablet 80 mg  80 mg Oral Daily Saundra Shelling, MD   80 mg at 04/27/15 [id number]  . gabapentin (NEURONTIN) capsule 600 mg  600 mg Oral TID Saundra Shelling, MD   600 mg at 04/27/15 2109  . magnesium hydroxide (MILK OF MAGNESIA) suspension 30 mL  30 mL Oral Daily PRN Saundra Shelling, MD      . meclizine (ANTIVERT) tablet 25 mg  25 mg Oral TID PRN Saundra Shelling, MD      . multivitamin with minerals tablet 1 tablet  1 tablet Oral Daily Saundra Shelling, MD   1 tablet at 04/27/15 0821  . mupirocin ointment (BACTROBAN) 2 % 1 application  1 application Nasal BID Saundra Shelling, MD   1 application at [id number]  .  ondansetron (ZOFRAN) tablet 4 mg  4 mg Oral Q6H PRN Saundra Shelling, MD       Or  . ondansetron (ZOFRAN) injection 4 mg  4 mg Intravenous Q6H PRN Pavan Pyreddy, MD      . pantoprazole (PROTONIX) EC tablet 40 mg  40 mg Oral Daily Saundra Shelling, MD   40 mg at 04/27/15 0821  . risperiDONE (RISPERDAL) tablet 2 mg  2 mg Oral QHS Saundra Shelling, MD   2 mg at 04/27/15 2109  . senna-docusate (Senokot-S) tablet 2 tablet  2 tablet Oral BID Saundra Shelling, MD   2 tablet at 04/27/15 2109  . sodium chloride flush (NS) 0.9 % injection 3 mL  3 mL Intravenous Q12H Saundra Shelling, MD   3 mL at 04/27/15 2109  . sodium chloride flush (NS) 0.9 % injection 3 mL  3 mL Intravenous PRN Saundra Shelling, MD      . traMADol Veatrice Bourbon) tablet 100 mg  100 mg Oral TID Saundra Shelling, MD   100 mg at 04/27/15 2108  . vitamin B-12 (CYANOCOBALAMIN) tablet 100 mcg  100 mcg Oral Daily Saundra Shelling, MD   100 mcg at 04/27/15 0820  . [START ON 05/08/2015] Vitamin D (Ergocalciferol) (DRISDOL) capsule 50,000 Units  50,000 Units Oral Q30 days Saundra Shelling, MD      . zolpidem (AMBIEN) tablet 10 mg  10 mg Oral QHS PRN Saundra Shelling, MD   10 mg at 04/27/15 2108     Discharge Medications: Medication List    ASK your doctor about these medications       acetaminophen 325 MG tablet  Commonly known as: TYLENOL  Take 325-350 mg by mouth every 4 (four) hours as needed.     allopurinol 300 MG tablet  Commonly known as: ZYLOPRIM  TAKE 1 TABLET BY MOUTH ONCE DAILY FOR GOUT     bisacodyl 5 MG EC tablet  Commonly known as: DULCOLAX  Take 5 mg by mouth every other day as needed for moderate constipation.     cefdinir 300 MG capsule  Commonly known as: OMNICEF  Take 300 mg by mouth 2 (two) times daily.     clonazePAM 0.5 MG tablet  Commonly known as: KLONOPIN  Take 1 tablet (0.5 mg total) by mouth 3 (three) times daily as needed for anxiety.     cyanocobalamin 1000 MCG tablet  Take 100 mcg by mouth  daily.     desonide 0.05 % ointment  Commonly known as: DESOWEN  Apply 1 application topically  3 (three) times daily as needed.     ergocalciferol 50000 units capsule  Commonly known as: VITAMIN D2  Take 50,000 Units by mouth every 30 (thirty) days.     FLUoxetine 40 MG capsule  Commonly known as: PROZAC  Take 1 capsule (40 mg total) by mouth daily.     furosemide 80 MG tablet  Commonly known as: LASIX  Take 80 mg by mouth daily.     gabapentin 300 MG capsule  Commonly known as: NEURONTIN  Take 2 capsules (600 mg total) by mouth 3 (three) times daily. Dx: E11.42     magnesium hydroxide 400 MG/5ML suspension  Commonly known as: MILK OF MAGNESIA  Take 30 mLs by mouth daily as needed for mild constipation.     meclizine 25 MG tablet  Commonly known as: ANTIVERT  Take 25 mg by mouth 3 (three) times daily as needed for dizziness.     multivitamin with minerals tablet  Take 1 tablet by mouth daily.     mupirocin ointment 2 %  Commonly known as: BACTROBAN  Place 1 application into the nose 2 (two) times daily.     omeprazole 20 MG capsule  Commonly known as: PRILOSEC  TAKE ONE CAPSULE BY MOUTH TWICE A DAY. *DO NOT CRUSH*     risperiDONE 1 MG tablet  Commonly known as: RISPERDAL  Take 2 tablets (2 mg total) by mouth at bedtime.     sennosides-docusate sodium 8.6-50 MG tablet  Commonly known as: SENOKOT-S  Take 2 tablets by mouth 2 (two) times daily.     traMADol 50 MG tablet  Commonly known as: ULTRAM  Take 2 tablets (100 mg total) by mouth 3 (three) times daily.     zolpidem 10 MG tablet  Commonly known as: AMBIEN  Take 10 mg by mouth at bedtime as needed for sleep.               Relevant Imaging Results:  Relevant Lab Results:   Additional Information SSN: [id number]  Micah Flesher, LCSW

## 2015-04-28 NOTE — Clinical Social Work Placement (Signed)
   CLINICAL SOCIAL WORK PLACEMENT  NOTE  Date:  04/28/2015  Patient Details  Name: Veronica Stafford MRN: [id number] Date of Birth: [birth year]  Clinical Social Work is seeking post-discharge placement for this patient at the [place] level of care (*CSW will initial, date and re-position this form in  chart as items are completed):  Yes   Patient/family provided with [place] Work Department's list of facilities offering this level of care within the geographic area requested by the patient (or if unable, by the patient's family).  Yes   Patient/family informed of their freedom to choose among providers that offer the needed level of care, that participate in Medicare, Medicaid or managed care program needed by the patient, have an available bed and are willing to accept the patient.      Patient/family informed of [facility]'s ownership interest in [facility] and [facility], as well as of the fact that they are under no obligation to receive care at these facilities.  PASRR submitted to EDS on       PASRR number received on 04/28/15     Existing PASRR number confirmed on       FL2 transmitted to all facilities in geographic area requested by pt/family on 04/28/15     FL2 transmitted to all facilities within larger geographic area on       Patient informed that his/her managed care company has contracts with or will negotiate with certain facilities, including the following:        Yes   Patient/family informed of bed offers received.  Patient chooses bed at  [facility])     Physician recommends and patient chooses bed at      Patient to be transferred to  [facility]) on 04/28/15.  Patient to be transferred to facility by  [facility])     Patient family notified on 04/28/15 of transfer.  Name of family member notified:   Khaila Shimabukuro [facility]) [phone])     PHYSICIAN       Additional Comment:     _______________________________________________ Micah Flesher, LCSW 04/28/2015, 1:30 PM

## 2015-04-28 NOTE — Progress Notes (Signed)
Attempted to call report to [facility] and no answer due, left message.

## 2015-04-28 NOTE — Discharge Summary (Addendum)
Veronica Stafford, is a 82 y.o. female  DOB September 26, 1928  MRN [id number].  Admission date:  04/26/2015  Admitting Physician  Saundra Shelling, MD  Discharge Date:  04/28/2015   Primary MD  Viviana Simpler, MD  Recommendations for primary care physician for things to follow:   Follow-up with primary doctor  one week.   Admission Diagnosis  Cellulitis of left lower extremity [L03.116]   Discharge Diagnosis  Cellulitis of left lower extremity [L03.116]    Active Problems:   Cellulitis and abscess of leg      Past Medical History  Diagnosis Date  . NASH (nonalcoholic steatohepatitis)   . Vitamin B12 deficiency   . Psychosis     paranoid hallucinations  . Pancytopenia     Dr Danie Chandler infusions  . Arthritis   . Anxiety   . Hypertension   . GERD (gastroesophageal reflux disease)   . Diabetes mellitus   . Osteoporosis   . Depression   . Lumbar disc disease   . Wrist fracture, left 9/12    Past Surgical History  Procedure Laterality Date  . Joint replacement      TKR bilateral  . Elbow surgery      4 on each  . Mandible surgery      twice  . Abdominal hysterectomy    . Cholecystectomy    . Breast surgery      breast reduction  . Hemorrhoid surgery    . Right ear lesion removal    . Fixation kyphoplasty  1/12  . Cataract extraction w/ intraocular lens  implant, bilateral    . Revision of tkr  9/13    Cartilage replaced       History of present illness and  Hospital Course:     Kindly see H&P for history of present illness and admission details, please review complete Labs, Consult reports and Test reports for all details in brief  HPI  from the history and physical done on the day of admission 82 year old female patient with history of arthritis, hypertension, diabetes mellitus type 2 comes in from [facility]  assisted living facility secondary to left leg cellulitis.Patient had a excision biopsy in the left calf for a for a growth(tumor) on February 6. Patient says it is a carcinoma according to her physician. Yesterday she saw her physician in the clinic, after returning back to assisted living facility she noticed some redness around the excision site. There is also tenderness and warmth the left cough and left lower leg. The pain is aching in nature 6 out of 10 on a scale of 1-10. Admitted for left leg cellulitis.   Hospital Course   1 left leg cellulitis: Started on IV clindamycin. The area of demarcation present on the left leg has diffuse erythema and decreased swelling. Patient feels much better. No fever. No white count. Asian and be discharged back to [facility] with clindamycin for 5 days.  #2 history of dementia and agitation: Continue Risperdal, Klonopin. #3 history of hypertension: Controlled continue alcohol Lasix. #4 chronic gout continue allopurinol. #5 constipation patient uses his stamina and a Dulcolax as needed. #6 GERD continue PPIs. Neuropathy; continue Neurontin   Discharge Condition: stable   Follow UP   follow-up with primary doctor in one week Follow up with dermatologist also in 1.   Discharge Instructions  and  Discharge Medications        Medication List    STOP taking these medications  cefdinir 300 MG capsule  Commonly known as:  OMNICEF     traMADol 50 MG tablet  Commonly known as:  ULTRAM      TAKE these medications        acetaminophen 325 MG tablet  Commonly known as:  TYLENOL  Take 325-350 mg by mouth every 4 (four) hours as needed.     allopurinol 300 MG tablet  Commonly known as:  ZYLOPRIM  TAKE 1 TABLET BY MOUTH ONCE DAILY FOR GOUT     bisacodyl 5 MG EC tablet  Commonly known as:  DULCOLAX  Take 5 mg by mouth every other day as needed for moderate constipation.     clindamycin 300 MG capsule  Commonly known as:   CLEOCIN  Take 1 capsule (300 mg total) by mouth 3 (three) times daily.     clonazePAM 0.5 MG tablet  Commonly known as:  KLONOPIN  Take 1 tablet (0.5 mg total) by mouth 3 (three) times daily as needed for anxiety.     cyanocobalamin 1000 MCG tablet  Take 100 mcg by mouth daily.     desonide 0.05 % ointment  Commonly known as:  DESOWEN  Apply 1 application topically 3 (three) times daily as needed.     ergocalciferol 50000 units capsule  Commonly known as:  VITAMIN D2  Take 50,000 Units by mouth every 30 (thirty) days.     FLUoxetine 40 MG capsule  Commonly known as:  PROZAC  Take 1 capsule (40 mg total) by mouth daily.     furosemide 80 MG tablet  Commonly known as:  LASIX  Take 80 mg by mouth daily.     gabapentin 300 MG capsule  Commonly known as:  NEURONTIN  Take 2 capsules (600 mg total) by mouth 3 (three) times daily. Dx: E11.42     magnesium hydroxide 400 MG/5ML suspension  Commonly known as:  MILK OF MAGNESIA  Take 30 mLs by mouth daily as needed for mild constipation.     meclizine 25 MG tablet  Commonly known as:  ANTIVERT  Take 25 mg by mouth 3 (three) times daily as needed for dizziness.     multivitamin with minerals tablet  Take 1 tablet by mouth daily.     mupirocin ointment 2 %  Commonly known as:  BACTROBAN  Place 1 application into the nose 2 (two) times daily.     omeprazole 20 MG capsule  Commonly known as:  PRILOSEC  TAKE ONE CAPSULE BY MOUTH TWICE A DAY. *DO NOT CRUSH*     risperiDONE 1 MG tablet  Commonly known as:  RISPERDAL  Take 2 tablets (2 mg total) by mouth at bedtime.     sennosides-docusate sodium 8.6-50 MG tablet  Commonly known as:  SENOKOT-S  Take 2 tablets by mouth 2 (two) times daily.     zolpidem 10 MG tablet  Commonly known as:  AMBIEN  Take 10 mg by mouth at bedtime as needed for sleep.          Diet and Activity recommendation: See Discharge Instructions above   Consults obtained -none   Major procedures  and Radiology Reports - PLEASE review detailed and final reports for all details, in brief -     No results found.  Micro Results    Recent Results (from the past 240 hour(s))  MRSA PCR Screening     Status: None   Collection Time: 04/27/15  1:31 AM  Result Value Ref Range Status  MRSA by PCR NEGATIVE NEGATIVE Final    Comment:        The GeneXpert MRSA Assay (FDA approved for NASAL specimens only), is one component of a comprehensive MRSA colonization surveillance program. It is not intended to diagnose MRSA infection nor to guide or monitor treatment for MRSA infections.        Today   Subjective:   Tamia Rana today . Cellulitis improved. And is eager to go back to [facility].  Objective:   Blood pressure 128/54, pulse 72, temperature 98.4 F (36.9 C), temperature source Oral, resp. rate 16, height 5\' 8"  (1.727 m), weight 87.227 kg (192 lb 4.8 oz), SpO2 95 %.   Intake/Output Summary (Last 24 hours) at 04/28/15 0911 Last data filed at 04/27/15 1800  Gross per 24 hour  Intake    240 ml  Output      1 ml  Net    239 ml    Exam Awake Alert, Oriented x 3, No new F.N deficits, Normal affect [place].AT,PERRAL Supple Neck,No JVD, No cervical lymphadenopathy appriciated.  Symmetrical Chest wall movement, Good air movement bilaterally, CTAB RRR,No Gallops,Rubs or new Murmurs, No Parasternal Heave +ve B.Sounds, Abd Soft, Non tender, No organomegaly appriciated, No rebound -guarding or rigidity. No Cyanosis, Clubbing or edema, No new Rash or bruise Left leg cellulitis is better. Data Review   CBC w Diff:  Lab Results  Component Value Date   WBC 2.9* 04/27/2015   WBC 3.2 01/21/2015   WBC 3.4* 05/17/2012   HGB 9.2* 04/27/2015   HGB 11.0* 05/17/2012   HCT 28.6* 04/27/2015   HCT 33.7* 05/17/2012   PLT 53* 04/27/2015   PLT 62* 05/17/2012   LYMPHOPCT 21% 04/26/2015   LYMPHOPCT 17.9 05/17/2012   MONOPCT 12% 04/26/2015   MONOPCT 9.3 05/17/2012   EOSPCT 2%  04/26/2015   EOSPCT 1.8 05/17/2012   BASOPCT 1% 04/26/2015   BASOPCT 0.8 05/17/2012    CMP:  Lab Results  Component Value Date   NA 140 04/27/2015   NA 141 11/21/2011   K 3.4* 04/27/2015   K 3.6 11/21/2011   CL 103 04/27/2015   CL 107 11/21/2011   CO2 32 04/27/2015   CO2 25 11/21/2011   BUN 15 04/27/2015   BUN 13 11/21/2011   CREATININE 1.12* 04/27/2015   CREATININE 1.2* 01/21/2015   CREATININE 0.95 11/21/2011   PROT 6.8 04/26/2015   PROT 7.8 11/12/2011   ALBUMIN 3.6 04/26/2015   ALBUMIN 3.6 11/12/2011   BILITOT 0.4 04/26/2015   BILITOT 0.5 11/12/2011   ALKPHOS 95 04/26/2015   ALKPHOS 142* 11/12/2011   AST 29 04/26/2015   AST 43* 11/12/2011   ALT 17 04/26/2015   ALT 24 11/12/2011  .   Total Time in preparing paper work, data evaluation and todays exam - 16 minutes  Lyne Khurana M.D on 04/28/2015 at 9:11 AM    Note: This dictation was prepared with Dragon dictation along with smaller phrase technology. Any transcriptional errors that result from this process are unintentional.

## 2015-04-28 NOTE — Progress Notes (Signed)
Patient has orders for d/c. Patient will be d/c to [facility]. Dr Vianne Bulls completed and signed d/c summary, FL2 and d/c order. All d/c information faxed to [facility].  CSW was given contact number for report, and room number. Patient leaving by EMS. EMS packet completed and placed on pts chart. DNR and Prescriptions were placed in packet. RN Joelene Millin was notified that packet was ready and placed next to patients chart with contact information for report. Rm # [id number], point of contact Mrs. Cletus Gash at [facility], contact # for report is [phone]. Patient Son Ania Krause) was notified of discharge via phone. No further identified needs.   Anitra Lauth, BSW, MSW, [place] Work Dept [phone]

## 2015-04-28 NOTE — Progress Notes (Signed)
[facility] returned call and provided report.  Called for non emergent transportation.

## 2015-04-28 NOTE — Progress Notes (Signed)
[place] [facility] [place] NAME: Veronica Stafford    MR#:  [id number]  DATE OF BIRTH:  05-20-1928  SUBJECTIVE: Admitted  for left leg cellulitis erythema, swelling of the left leg improved. Demarcated margins are clear and the surrounding area also is not red anymore. No fever.   CHIEF COMPLAINT:   Chief Complaint  Patient presents with  . Wound Infection    REVIEW OF SYSTEMS:    Review of Systems  Constitutional: Negative for fever and chills.  HENT: Negative for hearing loss.   Eyes: Negative for blurred vision, double vision and photophobia.  Respiratory: Negative for cough, hemoptysis and shortness of breath.   Cardiovascular: Positive for leg swelling. Negative for palpitations and orthopnea.       Left leg slightly red. Has dressing present.  Gastrointestinal: Negative for vomiting, abdominal pain and diarrhea.  Genitourinary: Negative for dysuria and urgency.  Musculoskeletal: Negative for myalgias and neck pain.  Skin: Negative for rash.  Neurological: Negative for dizziness, focal weakness, seizures, weakness and headaches.  Psychiatric/Behavioral: Negative for memory loss. The patient does not have insomnia.     Nutrition:  Tolerating Diet: Tolerating PT:      DRUG ALLERGIES:   Allergies  Allergen Reactions  . Codeine   . Demerol   . Erythromycin   . Fentanyl   . Flagyl [Metronidazole Hcl]   . Morphine And Related     VITALS:  Blood pressure 128/54, pulse 72, temperature 98.4 F (36.9 C), temperature source Oral, resp. rate 16, height 5\' 8"  (1.727 m), weight 87.227 kg (192 lb 4.8 oz), SpO2 95 %.  PHYSICAL EXAMINATION:   Physical Exam  GENERAL:  82 y.o.-year-old patient lying in the bed with no acute distress.  EYES: Pupils equal, round, reactive to light and accommodation. No scleral icterus. Extraocular muscles intact.  HEENT: Head atraumatic, normocephalic. Oropharynx and nasopharynx clear.  NECK:  Supple,  no jugular venous distention. No thyroid enlargement, no tenderness.  LUNGS: Normal breath sounds bilaterally, no wheezing, rales,rhonchi or crepitation. No use of accessory muscles of respiration.  CARDIOVASCULAR: S1, S2 normal. No murmurs, rubs, or gallops.  ABDOMEN: Soft, nontender, nondistended. Bowel sounds present. No organomegaly or mass.  EXTREMITIES: Decreased redness around the biopsy site, ankle. No tenderness. NEUROLOGIC: Cranial nerves II through XII are intact. Muscle strength 5/5 in all extremities. Sensation intact. Gait not checked.  PSYCHIATRIC: The patient is alert and oriented x 3.  SKIN: No obvious rash, lesion, or ulcer.    LABORATORY PANEL:   CBC  Recent Labs Lab 04/27/15 0339  WBC 2.9*  HGB 9.2*  HCT 28.6*  PLT 53*   ------------------------------------------------------------------------------------------------------------------  Chemistries   Recent Labs Lab 04/26/15 2227 04/27/15 0339  NA 142 140  K 3.9 3.4*  CL 103 103  CO2 29 32  GLUCOSE 102* 113*  BUN 15 15  CREATININE 1.20* 1.12*  CALCIUM 8.7* 8.3*  AST 29  --   ALT 17  --   ALKPHOS 95  --   BILITOT 0.4  --    ------------------------------------------------------------------------------------------------------------------  Cardiac Enzymes No results for input(s): TROPONINI in the last 168 hours. ------------------------------------------------------------------------------------------------------------------  RADIOLOGY:  No results found.   ASSESSMENT AND PLAN:   Active Problems:   Cellulitis and abscess of leg   #1. Left leg cellulitis: ;clinically improving,likley d/c to [facility]  ALF . Today if they can take her today. 2 chronic gout; continue allopurinol. #3 anxiety continue Klonopin. #4 history of  neuropathy: Continue Neurontin. #5 chronic thrombocytopenia: Stable. #6. History of fall and a TSH with chronic thrombocytopenia. #7 depression: Continue  SSRIs.    All the records are reviewed and case discussed with Care Management/Social Workerr. Management plans discussed with the patient, family and they are in agreement.  CODE STATUS: DNR  TOTAL TIME TAKING CARE OF THIS PATIENT: 20 minutes.   Likely discharged today if  [facility] can take the patient back today Jamall Strohmeier M.D on 04/28/2015 at 7:51 AM  Between 7am to 6pm - Pager - [phone]  After 6pm go to [email or web address] - password [email or web address] [facility]  Office  [phone]  CC: Primary care physician; Viviana Simpler, MD

## 2015-06-13 ENCOUNTER — Non-Acute Institutional Stay: Payer: Medicare Other | Admitting: Internal Medicine

## 2015-06-13 ENCOUNTER — Encounter: Payer: Self-pay | Admitting: Internal Medicine

## 2015-06-13 VITALS — BP 124/78 | HR 78 | Temp 97.9°F | Resp 16 | Wt 193.0 lb

## 2015-06-13 DIAGNOSIS — B351 Tinea unguium: Secondary | ICD-10-CM

## 2015-06-13 DIAGNOSIS — E1149 Type 2 diabetes mellitus with other diabetic neurological complication: Secondary | ICD-10-CM

## 2015-06-13 DIAGNOSIS — F339 Major depressive disorder, recurrent, unspecified: Secondary | ICD-10-CM

## 2015-06-13 DIAGNOSIS — D61818 Other pancytopenia: Secondary | ICD-10-CM

## 2015-06-13 DIAGNOSIS — K439 Ventral hernia without obstruction or gangrene: Secondary | ICD-10-CM

## 2015-06-13 DIAGNOSIS — M5116 Intervertebral disc disorders with radiculopathy, lumbar region: Secondary | ICD-10-CM | POA: Diagnosis not present

## 2015-06-13 NOTE — Assessment & Plan Note (Signed)
Has had good control Gabapentin gives reasonable relief of neuropathic pain

## 2015-06-13 NOTE — Assessment & Plan Note (Signed)
With psychosis in the past Doing well on fluoxetine and risperidone Wean is contraindicated---caused psychosis in past

## 2015-06-13 NOTE — Assessment & Plan Note (Signed)
Ongoing pain but no change in function Continues on the regular tramadol

## 2015-06-13 NOTE — Assessment & Plan Note (Signed)
Both thumbs recurrently  PROCEDURE Both thumbnails treated with power tool and reduced and smoothed Ingrown portions removed

## 2015-06-13 NOTE — Assessment & Plan Note (Signed)
This continues to bother her--but doesn't really seem painful Action not indicated

## 2015-06-13 NOTE — Progress Notes (Signed)
Subjective:    Patient ID: Veronica Stafford, female    DOB: [birth year], 82 y.o.   MRN: [id number]  HPI Visit for follow up of chronic health conditions Reviewed status with [facility] RN--no new significant concerns from staff  She states she is "fair" Ongoing trouble with ventral hernia---she feels it is bigger Has pain in groin area from this Appetite is fair No N/V  Ongoing back pain and left sciatica Some relief with tramadol Walks with rollator  Sugars checked weekly Generally under 130 No hypoglycemic reactions Ongoing sensory changes and pain in leg  Had skin cancer removed from left calf Subsequent secondary infection---needed Rx This is improved now  Depression mostly controlled No hallucinations in some time Nerves are okay---now getting clonazepam routinely at night and she feels that has helped  Current Outpatient Prescriptions on File Prior to Visit  Medication Sig Dispense Refill  . allopurinol (ZYLOPRIM) 300 MG tablet TAKE 1 TABLET BY MOUTH ONCE DAILY FOR GOUT 30 tablet 11  . bisacodyl (DULCOLAX) 5 MG EC tablet Take 5 mg by mouth every other day as needed for moderate constipation.    . clindamycin (CLEOCIN) 300 MG capsule Take 1 capsule (300 mg total) by mouth 3 (three) times daily. 15 capsule 0  . cyanocobalamin 1000 MCG tablet Take 100 mcg by mouth daily.    Marland Kitchen desonide (DESOWEN) 0.05 % ointment Apply 1 application topically 3 (three) times daily as needed.    . ergocalciferol (VITAMIN D2) 50000 UNITS capsule Take 50,000 Units by mouth every 30 (thirty) days.      Marland Kitchen FLUoxetine (PROZAC) 40 MG capsule Take 1 capsule (40 mg total) by mouth daily. 30 capsule 11  . furosemide (LASIX) 80 MG tablet Take 80 mg by mouth daily.      Marland Kitchen gabapentin (NEURONTIN) 300 MG capsule Take 2 capsules (600 mg total) by mouth 3 (three) times daily. Dx: E11.42 180 capsule 11  . meclizine (ANTIVERT) 25 MG tablet Take 25 mg by mouth 3 (three) times daily as needed for dizziness.    .  Multiple Vitamins-Minerals (MULTIVITAMIN WITH MINERALS) tablet Take 1 tablet by mouth daily.     . mupirocin ointment (BACTROBAN) 2 % Place 1 application into the nose 2 (two) times daily.    Marland Kitchen omeprazole (PRILOSEC) 20 MG capsule TAKE ONE CAPSULE BY MOUTH TWICE A DAY. *DO NOT CRUSH* 60 capsule 11  . risperiDONE (RISPERDAL) 1 MG tablet Take 2 tablets (2 mg total) by mouth at bedtime. 30 tablet 11  . sennosides-docusate sodium (SENOKOT-S) 8.6-50 MG tablet Take 2 tablets by mouth 2 (two) times daily.     Marland Kitchen zolpidem (AMBIEN) 10 MG tablet Take 10 mg by mouth at bedtime as needed for sleep.     No current facility-administered medications on file prior to visit.    Allergies  Allergen Reactions  . Codeine   . Demerol   . Erythromycin   . Fentanyl   . Flagyl [Metronidazole Hcl]   . Morphine And Related     Past Medical History  Diagnosis Date  . NASH (nonalcoholic steatohepatitis)   . Vitamin B12 deficiency   . Psychosis     paranoid hallucinations  . Pancytopenia     Dr Danie Chandler infusions  . Arthritis   . Anxiety   . Hypertension   . GERD (gastroesophageal reflux disease)   . Diabetes mellitus   . Osteoporosis   . Depression   . Lumbar disc disease   . Wrist fracture, left  9/12    Past Surgical History  Procedure Laterality Date  . Joint replacement      TKR bilateral  . Elbow surgery      4 on each  . Mandible surgery      twice  . Abdominal hysterectomy    . Cholecystectomy    . Breast surgery      breast reduction  . Hemorrhoid surgery    . Right ear lesion removal    . Fixation kyphoplasty  1/12  . Cataract extraction w/ intraocular lens  implant, bilateral    . Revision of tkr  9/13    Cartilage replaced    Family History  Problem Relation Age of Onset  . Cancer Mother     colon  . Heart disease Father     Social History   Social History  . Marital Status: Widowed    Spouse Name: N/A  . Number of Children: N/A  . Years of Education: N/A    Occupational History  . Nurse, retired     retired   Social History Main Topics  . Smoking status: Never Smoker   . Smokeless tobacco: Never Used  . Alcohol Use: No  . Drug Use: No  . Sexual Activity: Not on file   Other Topics Concern  . Not on file   Social History Narrative   1 son nearby--he is to make medical decisions   2 daughters   Has DNR   Would not accept tube feeds   Review of Systems  Sleeps well with the meds Bowels are fine Both thumbnails are long and bothersome--they cause her discomfort Still gets regular care for toenails with the podiatrist    Objective:   Physical Exam  Constitutional: She appears well-developed and well-nourished. No distress.  Neck: Normal range of motion. Neck supple. No thyromegaly present.  Cardiovascular: Normal rate, regular rhythm, normal heart sounds and intact distal pulses.  Exam reveals no gallop.   No murmur heard. Pulmonary/Chest: Effort normal and breath sounds normal. No respiratory distress. She has no wheezes. She has no rales.  Abdominal: Soft. There is no tenderness.  Non tender ventral hernia palpated in fat anteriorly  Musculoskeletal: She exhibits no edema or tenderness.  Lymphadenopathy:    She has no cervical adenopathy.  Skin:  Thick mycotic thumbnails with some distal ingrowing along the sides  Psychiatric:  Usual somewhat flat affect but normal interaction and not depressed          Assessment & Plan:

## 2015-06-13 NOTE — Assessment & Plan Note (Signed)
Mild and likely related to chronic liver disease Anemia slightly worse No longer seeing Dr Derrick Ravel send back if worsens

## 2015-06-26 LAB — HM DIABETES EYE EXAM

## 2015-06-27 ENCOUNTER — Encounter: Payer: Self-pay | Admitting: Internal Medicine

## 2015-06-27 ENCOUNTER — Non-Acute Institutional Stay: Payer: Medicare Other | Admitting: Internal Medicine

## 2015-06-27 VITALS — BP 110/54 | HR 70 | Resp 16

## 2015-06-27 DIAGNOSIS — L723 Sebaceous cyst: Secondary | ICD-10-CM

## 2015-06-27 NOTE — Assessment & Plan Note (Signed)
Ruptured but not infected I suspect the pain is from feeling it (she is quite sensitive and anxious) The cyst is now empty--it should probably heal over on its own If ongoing problems, would need to see surgeon to consider resection

## 2015-06-27 NOTE — Progress Notes (Signed)
Subjective:    Patient ID: Veronica Stafford, female    DOB: [birth year], 82 y.o.   MRN: [id number]  HPI Asked to see by [facility] RN  Has known hemorrhoid but for a few days she has had a separate raised area that has been painful Slight discharge Trying topical Rx like hemorrhoid but not better  Bowels have been okay No change in appetite  Current Outpatient Prescriptions on File Prior to Visit  Medication Sig Dispense Refill  . allopurinol (ZYLOPRIM) 300 MG tablet TAKE 1 TABLET BY MOUTH ONCE DAILY FOR GOUT 30 tablet 11  . bisacodyl (DULCOLAX) 5 MG EC tablet Take 5 mg by mouth every other day as needed for moderate constipation.    . clindamycin (CLEOCIN) 300 MG capsule Take 1 capsule (300 mg total) by mouth 3 (three) times daily. 15 capsule 0  . clonazePAM (KLONOPIN) 0.5 MG tablet Take 0.5 mg by mouth at bedtime. And bid prn    . cyanocobalamin 1000 MCG tablet Take 100 mcg by mouth daily.    Marland Kitchen desonide (DESOWEN) 0.05 % ointment Apply 1 application topically 3 (three) times daily as needed.    . ergocalciferol (VITAMIN D2) 50000 UNITS capsule Take 50,000 Units by mouth every 30 (thirty) days.      Marland Kitchen FLUoxetine (PROZAC) 40 MG capsule Take 1 capsule (40 mg total) by mouth daily. 30 capsule 11  . furosemide (LASIX) 80 MG tablet Take 80 mg by mouth daily.      Marland Kitchen gabapentin (NEURONTIN) 300 MG capsule Take 2 capsules (600 mg total) by mouth 3 (three) times daily. Dx: E11.42 180 capsule 11  . meclizine (ANTIVERT) 25 MG tablet Take 25 mg by mouth 3 (three) times daily as needed for dizziness.    . Multiple Vitamins-Minerals (MULTIVITAMIN WITH MINERALS) tablet Take 1 tablet by mouth daily.     . mupirocin ointment (BACTROBAN) 2 % Place 1 application into the nose 2 (two) times daily.    Marland Kitchen omeprazole (PRILOSEC) 20 MG capsule TAKE ONE CAPSULE BY MOUTH TWICE A DAY. *DO NOT CRUSH* 60 capsule 11  . risperiDONE (RISPERDAL) 1 MG tablet Take 2 tablets (2 mg total) by mouth at bedtime. 30 tablet 11  .  sennosides-docusate sodium (SENOKOT-S) 8.6-50 MG tablet Take 2 tablets by mouth 2 (two) times daily.     . traMADol (ULTRAM) 50 MG tablet Take 100 mg by mouth 3 (three) times daily.    Marland Kitchen zolpidem (AMBIEN) 10 MG tablet Take 10 mg by mouth at bedtime as needed for sleep.     No current facility-administered medications on file prior to visit.    Allergies  Allergen Reactions  . Codeine   . Demerol   . Erythromycin   . Fentanyl   . Flagyl [Metronidazole Hcl]   . Morphine And Related     Past Medical History  Diagnosis Date  . NASH (nonalcoholic steatohepatitis)   . Vitamin B12 deficiency   . Psychosis     paranoid hallucinations  . Pancytopenia     Dr Danie Chandler infusions  . Arthritis   . Anxiety   . Hypertension   . GERD (gastroesophageal reflux disease)   . Diabetes mellitus   . Osteoporosis   . Depression   . Lumbar disc disease   . Wrist fracture, left 9/12    Past Surgical History  Procedure Laterality Date  . Joint replacement      TKR bilateral  . Elbow surgery      4 on  each  . Mandible surgery      twice  . Abdominal hysterectomy    . Cholecystectomy    . Breast surgery      breast reduction  . Hemorrhoid surgery    . Right ear lesion removal    . Fixation kyphoplasty  1/12  . Cataract extraction w/ intraocular lens  implant, bilateral    . Revision of tkr  9/13    Cartilage replaced    Family History  Problem Relation Age of Onset  . Cancer Mother     colon  . Heart disease Father     Social History   Social History  . Marital Status: Widowed    Spouse Name: N/A  . Number of Children: N/A  . Years of Education: N/A   Occupational History  . Nurse, retired     retired   Social History Main Topics  . Smoking status: Never Smoker   . Smokeless tobacco: Never Used  . Alcohol Use: No  . Drug Use: No  . Sexual Activity: Not on file   Other Topics Concern  . Not on file   Social History Narrative   1 son nearby--he is to make  medical decisions   2 daughters   Has DNR   Would not accept tube feeds      Review of Systems No fever Hasn't been sick     Objective:   Physical Exam  Constitutional: She appears well-developed and well-nourished. No distress.  Genitourinary:  Small noninflamed hemorrhoid ~3 cm above and to left of anus--there is a raised lesion which is broken and has typical white sebaceous material attached. I was able to remove most of this with my fingers. The lesion is not inflamed          Assessment & Plan:

## 2015-07-04 ENCOUNTER — Encounter: Payer: Self-pay | Admitting: Internal Medicine

## 2015-08-07 ENCOUNTER — Encounter: Payer: Self-pay | Admitting: Internal Medicine

## 2015-08-08 LAB — HEMOGLOBIN A1C: Hemoglobin A1C: 9.3

## 2015-08-13 ENCOUNTER — Encounter: Payer: Self-pay | Admitting: Internal Medicine

## 2015-08-14 ENCOUNTER — Non-Acute Institutional Stay: Payer: Medicare Other | Admitting: Internal Medicine

## 2015-08-14 ENCOUNTER — Encounter: Payer: Self-pay | Admitting: Internal Medicine

## 2015-08-14 VITALS — BP 121/68 | HR 70

## 2015-08-14 DIAGNOSIS — K602 Anal fissure, unspecified: Secondary | ICD-10-CM | POA: Diagnosis not present

## 2015-08-14 NOTE — Progress Notes (Signed)
Subjective:    Patient ID: Veronica Stafford, female    DOB: 05-04-1928, 82 y.o.   MRN: [id number]  HPI  Asked to check resident in [address] Rn reports rectal pain secondary to hemorrhoid Has been using Proctosol HC BID x 2 weeks with no relief.  Not constipated, uses Senekot and Dulcolax  Review of Systems      Past Medical History  Diagnosis Date  . NASH (nonalcoholic steatohepatitis)   . Vitamin B12 deficiency   . Psychosis     paranoid hallucinations  . Pancytopenia     Dr Danie Chandler infusions  . Arthritis   . Anxiety   . Hypertension   . GERD (gastroesophageal reflux disease)   . Diabetes mellitus   . Osteoporosis   . Depression   . Lumbar disc disease   . Wrist fracture, left 9/12    Current Outpatient Prescriptions  Medication Sig Dispense Refill  . allopurinol (ZYLOPRIM) 300 MG tablet TAKE 1 TABLET BY MOUTH ONCE DAILY FOR GOUT 30 tablet 11  . bisacodyl (DULCOLAX) 5 MG EC tablet Take 5 mg by mouth every other day as needed for moderate constipation.    . clonazePAM (KLONOPIN) 0.5 MG tablet Take 0.5 mg by mouth at bedtime. And bid prn    . cyanocobalamin 1000 MCG tablet Take 100 mcg by mouth daily.    . ergocalciferol (VITAMIN D2) 50000 UNITS capsule Take 50,000 Units by mouth every 30 (thirty) days.      Marland Kitchen FLUoxetine (PROZAC) 40 MG capsule Take 1 capsule (40 mg total) by mouth daily. 30 capsule 11  . furosemide (LASIX) 80 MG tablet Take 80 mg by mouth daily.      Marland Kitchen gabapentin (NEURONTIN) 300 MG capsule Take 2 capsules (600 mg total) by mouth 3 (three) times daily. Dx: E11.42 180 capsule 11  . Multiple Vitamins-Minerals (MULTIVITAMIN WITH MINERALS) tablet Take 1 tablet by mouth daily.     Marland Kitchen omeprazole (PRILOSEC) 20 MG capsule TAKE ONE CAPSULE BY MOUTH TWICE A DAY. *DO NOT CRUSH* 60 capsule 11  . risperiDONE (RISPERDAL) 1 MG tablet Take 2 tablets (2 mg total) by mouth at bedtime. 30 tablet 11  . sennosides-docusate sodium (SENOKOT-S) 8.6-50 MG tablet Take 2 tablets  by mouth 2 (two) times daily.     . traMADol (ULTRAM) 50 MG tablet Take 100 mg by mouth 3 (three) times daily.    Marland Kitchen zolpidem (AMBIEN) 10 MG tablet Take 10 mg by mouth at bedtime as needed for sleep.    . clindamycin (CLEOCIN) 300 MG capsule Take 1 capsule (300 mg total) by mouth 3 (three) times daily. (Patient not taking: Reported on 08/14/2015) 15 capsule 0  . desonide (DESOWEN) 0.05 % ointment Apply 1 application topically 3 (three) times daily as needed. Reported on 08/14/2015    . meclizine (ANTIVERT) 25 MG tablet Take 25 mg by mouth 3 (three) times daily as needed for dizziness. Reported on 08/14/2015    . mupirocin ointment (BACTROBAN) 2 % Place 1 application into the nose 2 (two) times daily. Reported on 08/14/2015     No current facility-administered medications for this visit.    Allergies  Allergen Reactions  . Codeine   . Demerol   . Erythromycin   . Fentanyl   . Flagyl [Metronidazole Hcl]   . Morphine And Related     Family History  Problem Relation Age of Onset  . Cancer Mother     colon  . Heart disease Father  Social History   Social History  . Marital Status: Widowed    Spouse Name: N/A  . Number of Children: N/A  . Years of Education: N/A   Occupational History  . Nurse, retired     retired   Social History Main Topics  . Smoking status: Never Smoker   . Smokeless tobacco: Never Used  . Alcohol Use: No  . Drug Use: No  . Sexual Activity: Not on file   Other Topics Concern  . Not on file   Social History Narrative   1 son nearby--he is to make medical decisions   2 daughters   Has DNR   Would not accept tube feeds     Constitutional: Denies fever, malaise, fatigue, headache or abrupt weight changes.  Gastrointestinal: Pt reports rectal pain. Denies abdominal pain, bloating, constipation, diarrhea or blood in the stool.    No other specific complaints in a complete review of systems (except as listed in HPI above).  Objective:   Physical  Exam  BP 121/68 mmHg  Pulse 70 Wt Readings from Last 3 Encounters:  06/13/15 193 lb (87.544 kg)  04/27/15 192 lb 4.8 oz (87.227 kg)  03/07/15 191 lb (86.637 kg)    General: Appears her stated age, obese in NAD. Rectal: Small external hemorrhoid noted, not thrombosed or bleeding. Anal fissure noted at 9 oclock, not actively bleeding. Areas of hypopigmentation noted around anal opening.   BMET    Component Value Date/Time   NA 140 04/27/2015 0339   NA 141 11/21/2011 0416   K 3.4* 04/27/2015 0339   K 3.6 11/21/2011 0416   CL 103 04/27/2015 0339   CL 107 11/21/2011 0416   CO2 32 04/27/2015 0339   CO2 25 11/21/2011 0416   GLUCOSE 113* 04/27/2015 0339   GLUCOSE 124* 11/21/2011 0416   BUN 15 04/27/2015 0339   BUN 13 11/21/2011 0416   CREATININE 1.12* 04/27/2015 0339   CREATININE 1.2* 01/21/2015   CREATININE 0.95 11/21/2011 0416   CALCIUM 8.3* 04/27/2015 0339   CALCIUM 7.7* 11/21/2011 0416   GFRNONAA 44* 04/27/2015 0339   GFRNONAA 56* 11/21/2011 0416   GFRAA 51* 04/27/2015 0339   GFRAA >60 11/21/2011 0416    Lipid Panel     Component Value Date/Time   LDLCALC 98 08/14/2013    CBC    Component Value Date/Time   WBC 2.9* 04/27/2015 0339   WBC 3.2 01/21/2015   WBC 3.4* 05/17/2012 1040   RBC 3.45* 04/27/2015 0339   RBC 3.94 05/17/2012 1040   HGB 9.2* 04/27/2015 0339   HGB 11.0* 05/17/2012 1040   HCT 28.6* 04/27/2015 0339   HCT 33.7* 05/17/2012 1040   PLT 53* 04/27/2015 0339   PLT 62* 05/17/2012 1040   MCV 83.0 04/27/2015 0339   MCV 86 05/17/2012 1040   MCH 26.8 04/27/2015 0339   MCH 28.0 05/17/2012 1040   MCHC 32.3 04/27/2015 0339   MCHC 32.7 05/17/2012 1040   RDW 15.5* 04/27/2015 0339   RDW 16.2* 05/17/2012 1040   LYMPHSABS 0.7* 04/26/2015 2227   LYMPHSABS 0.6* 05/17/2012 1040   MONOABS 0.4 04/26/2015 2227   MONOABS 0.3 05/17/2012 1040   EOSABS 0.1 04/26/2015 2227   EOSABS 0.1 05/17/2012 1040   BASOSABS 0.0 04/26/2015 2227   BASOSABS 0.0 05/17/2012 1040     Hgb A1C Lab Results  Component Value Date   HGBA1C 9.3 08/08/2015         Assessment & Plan:   Anal fissure:  Stop  Proctosol eRx for Nitroglycerin Ointment 0.2% BID x 1 week  Will monitor closely, follow up in 1 week

## 2015-08-15 ENCOUNTER — Encounter: Payer: Self-pay | Admitting: Internal Medicine

## 2015-08-17 IMAGING — CR DG FOOT COMPLETE 3+V*L*
1 series · 3 of 3 positions shown · non-contrast
Comparison: None.

CLINICAL DATA: Fall yesterday with foot pain, initial encounter

EXAM:
LEFT FOOT - COMPLETE 3+ VIEW

[Series 1: dg foot complete left · 0.14mm/px · 3 of 3 slices shown]
[im 1/3]
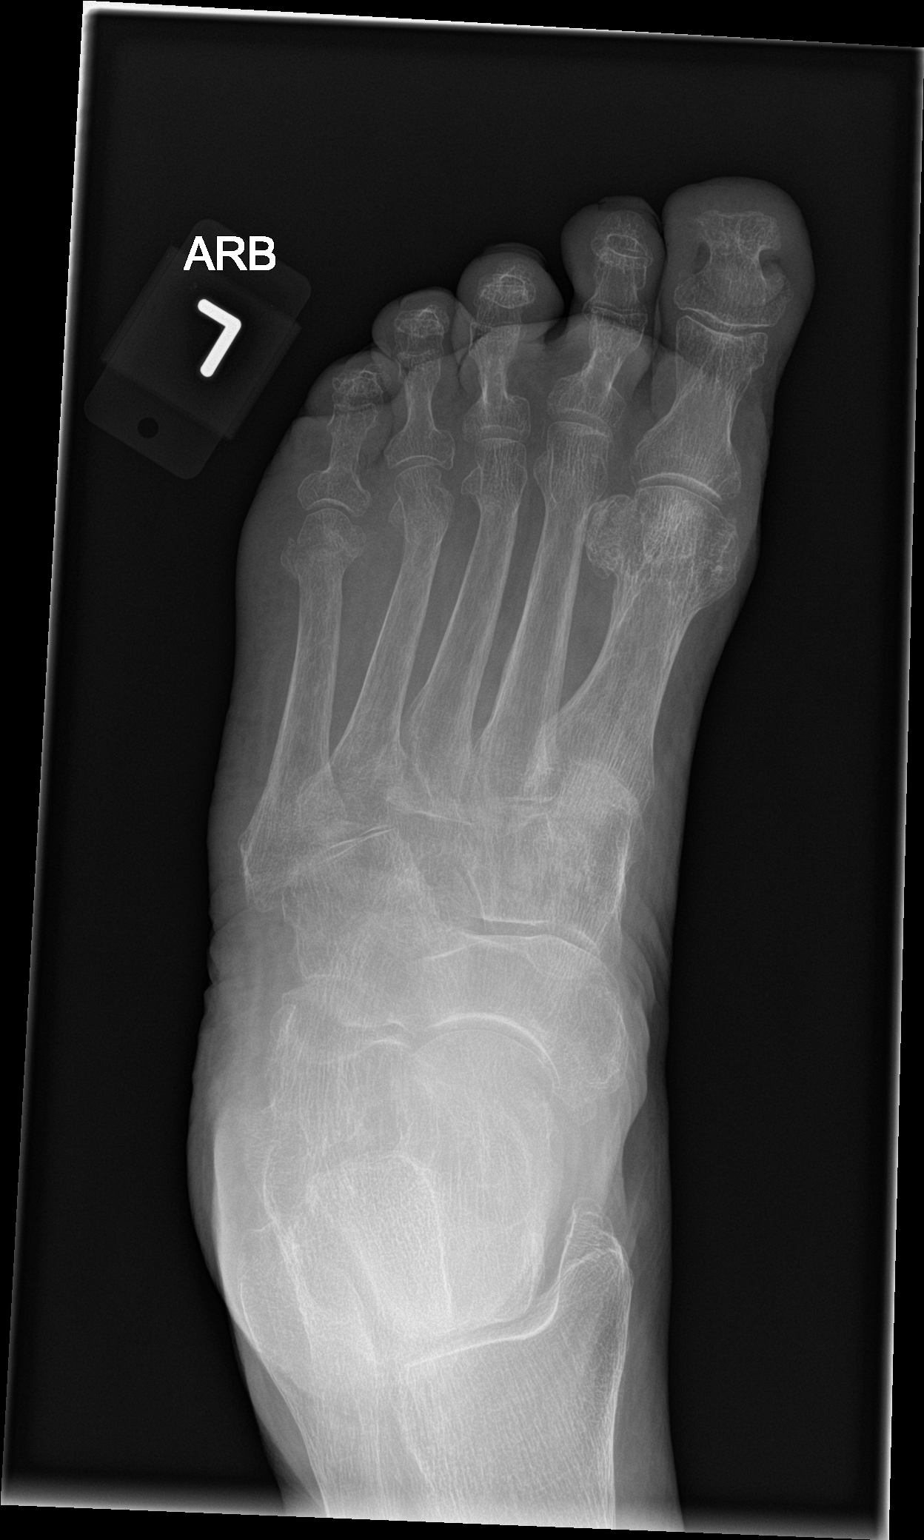
[im 2/3]
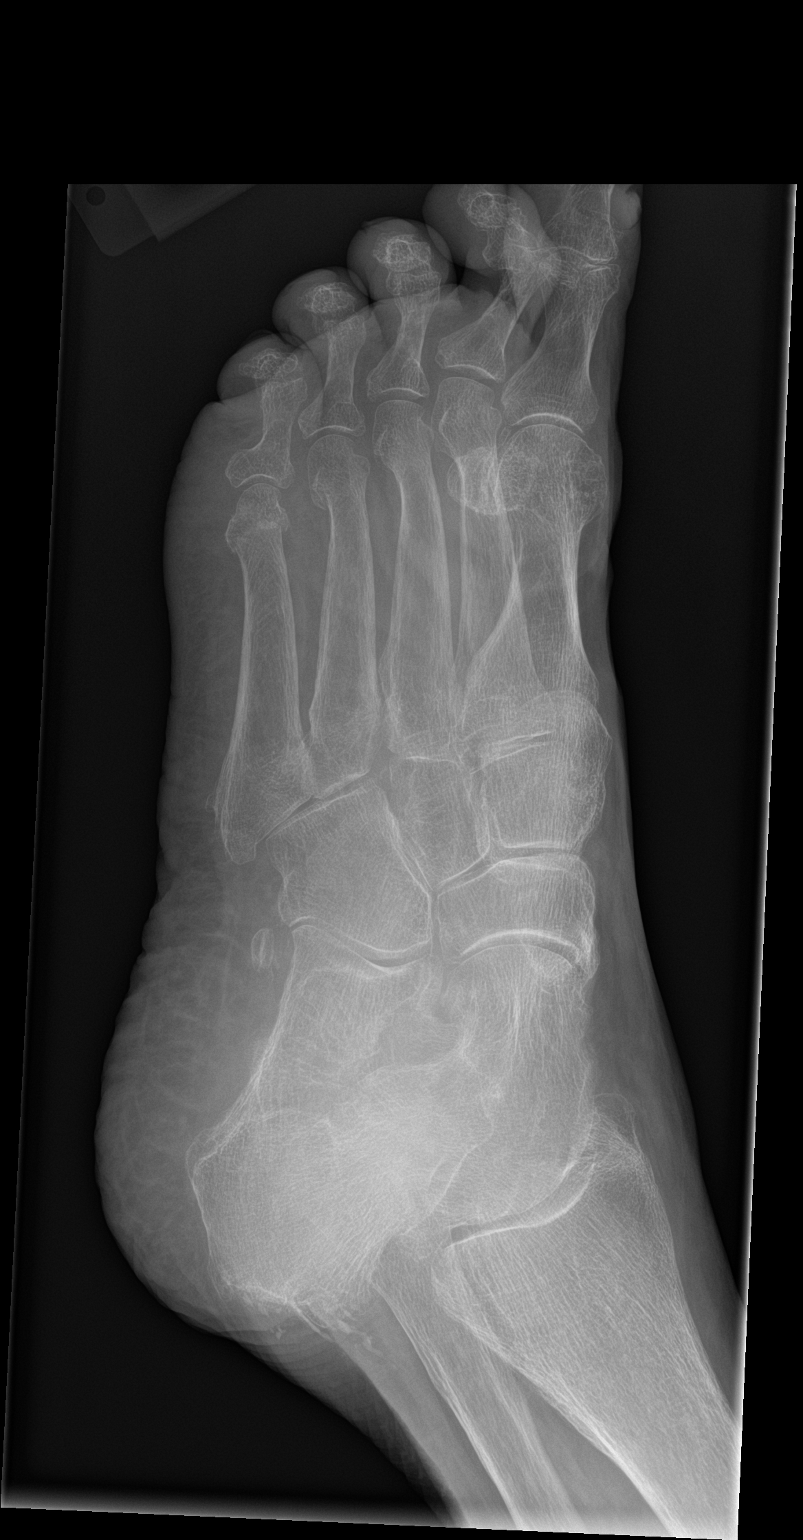
[im 3/3]
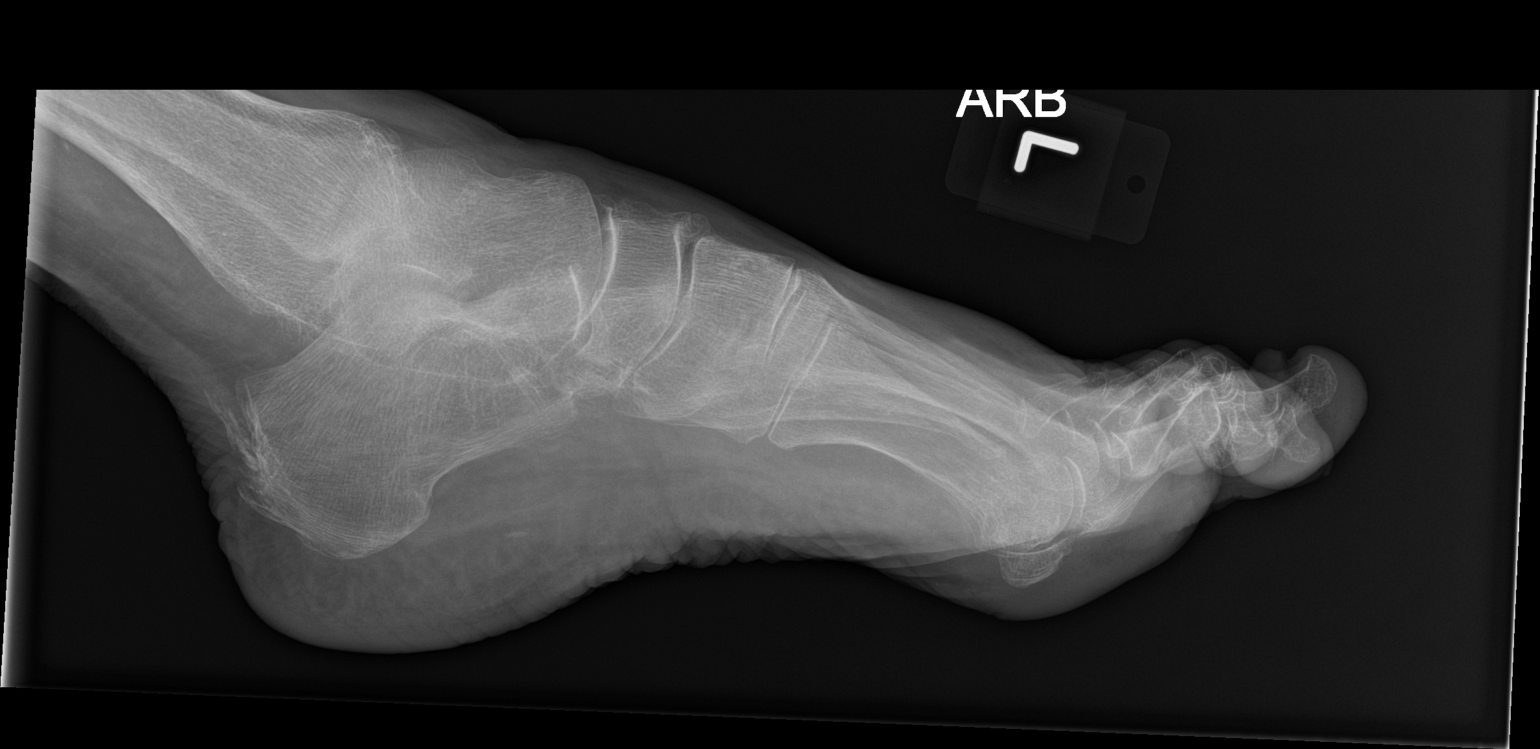

[3 of 3 positions shown; findings below may reference images not displayed]

FINDINGS: Undisplaced fractures of the third, fourth and fifth metatarsal
heads are seen. No other fractures are noted. Generalized osteopenia
is seen.
IMPRESSION: Fractures of the third fourth and fifth metatarsals as described.

## 2015-08-21 ENCOUNTER — Encounter: Payer: Self-pay | Admitting: Internal Medicine

## 2015-08-21 ENCOUNTER — Non-Acute Institutional Stay: Payer: Medicare Other | Admitting: Internal Medicine

## 2015-08-21 VITALS — BP 122/58 | HR 85 | Resp 18

## 2015-08-21 DIAGNOSIS — K602 Anal fissure, unspecified: Secondary | ICD-10-CM | POA: Diagnosis not present

## 2015-08-21 DIAGNOSIS — R159 Full incontinence of feces: Secondary | ICD-10-CM

## 2015-08-21 NOTE — Progress Notes (Signed)
Subjective:    Patient ID: Veronica Stafford, female    DOB: 1929/02/09, 82 y.o.   MRN: [id number]  HPI  Asked to check resident in [address] Recheck anal fissure- no more bleeding but nitro ointment painful after application  Not constipated, uses Senekot and Dulcolax  Review of Systems      Past Medical History  Diagnosis Date  . NASH (nonalcoholic steatohepatitis)   . Vitamin B12 deficiency   . Psychosis     paranoid hallucinations  . Pancytopenia     Dr Danie Chandler infusions  . Arthritis   . Anxiety   . Hypertension   . GERD (gastroesophageal reflux disease)   . Diabetes mellitus   . Osteoporosis   . Depression   . Lumbar disc disease   . Wrist fracture, left 9/12    Current Outpatient Prescriptions  Medication Sig Dispense Refill  . allopurinol (ZYLOPRIM) 300 MG tablet TAKE 1 TABLET BY MOUTH ONCE DAILY FOR GOUT 30 tablet 11  . bisacodyl (DULCOLAX) 5 MG EC tablet Take 5 mg by mouth every other day as needed for moderate constipation.    . clindamycin (CLEOCIN) 300 MG capsule Take 1 capsule (300 mg total) by mouth 3 (three) times daily. 15 capsule 0  . clonazePAM (KLONOPIN) 0.5 MG tablet Take 0.5 mg by mouth at bedtime. And bid prn    . cyanocobalamin 1000 MCG tablet Take 100 mcg by mouth daily.    . ergocalciferol (VITAMIN D2) 50000 UNITS capsule Take 50,000 Units by mouth every 30 (thirty) days.      Marland Kitchen FLUoxetine (PROZAC) 40 MG capsule Take 1 capsule (40 mg total) by mouth daily. 30 capsule 11  . furosemide (LASIX) 80 MG tablet Take 80 mg by mouth daily.      Marland Kitchen gabapentin (NEURONTIN) 300 MG capsule Take 2 capsules (600 mg total) by mouth 3 (three) times daily. Dx: E11.42 180 capsule 11  . Multiple Vitamins-Minerals (MULTIVITAMIN WITH MINERALS) tablet Take 1 tablet by mouth daily.     Marland Kitchen omeprazole (PRILOSEC) 20 MG capsule TAKE ONE CAPSULE BY MOUTH TWICE A DAY. *DO NOT CRUSH* 60 capsule 11  . risperiDONE (RISPERDAL) 1 MG tablet Take 2 tablets (2 mg total) by mouth at  bedtime. 30 tablet 11  . sennosides-docusate sodium (SENOKOT-S) 8.6-50 MG tablet Take 2 tablets by mouth 2 (two) times daily.     . traMADol (ULTRAM) 50 MG tablet Take 100 mg by mouth 3 (three) times daily.    Marland Kitchen zolpidem (AMBIEN) 10 MG tablet Take 10 mg by mouth at bedtime as needed for sleep.    Marland Kitchen desonide (DESOWEN) 0.05 % ointment Apply 1 application topically 3 (three) times daily as needed. Reported on 08/21/2015    . meclizine (ANTIVERT) 25 MG tablet Take 25 mg by mouth 3 (three) times daily as needed for dizziness. Reported on 08/21/2015    . mupirocin ointment (BACTROBAN) 2 % Place 1 application into the nose 2 (two) times daily. Reported on 08/21/2015     No current facility-administered medications for this visit.    Allergies  Allergen Reactions  . Codeine   . Demerol   . Erythromycin   . Fentanyl   . Flagyl [Metronidazole Hcl]   . Morphine And Related     Family History  Problem Relation Age of Onset  . Cancer Mother     colon  . Heart disease Father     Social History   Social History  . Marital Status: Widowed  Spouse Name: N/A  . Number of Children: N/A  . Years of Education: N/A   Occupational History  . Nurse, retired     retired   Social History Main Topics  . Smoking status: Never Smoker   . Smokeless tobacco: Never Used  . Alcohol Use: No  . Drug Use: No  . Sexual Activity: Not on file   Other Topics Concern  . Not on file   Social History Narrative   1 son nearby--he is to make medical decisions   2 daughters   Has DNR   Would not accept tube feeds     Constitutional: Denies fever, malaise, fatigue, headache or abrupt weight changes.  Gastrointestinal: Pt reports rectal pain. Denies abdominal pain, bloating, constipation, diarrhea or blood in the stool.    No other specific complaints in a complete review of systems (except as listed in HPI above).  Objective:   Physical Exam  BP 122/58 mmHg  Pulse 85  Resp 18 Wt Readings from  Last 3 Encounters:  06/13/15 193 lb (87.544 kg)  04/27/15 192 lb 4.8 oz (87.227 kg)  03/07/15 191 lb (86.637 kg)    General: Appears her stated age, obese in NAD. Rectal: Anal fissure noted at 9 oclock, almost completely healed. Areas of hypopigmentation noted around anal opening. Rectal leakage noted with surrounding skin maceration.  BMET    Component Value Date/Time   NA 140 04/27/2015 0339   NA 141 11/21/2011 0416   K 3.4* 04/27/2015 0339   K 3.6 11/21/2011 0416   CL 103 04/27/2015 0339   CL 107 11/21/2011 0416   CO2 32 04/27/2015 0339   CO2 25 11/21/2011 0416   GLUCOSE 113* 04/27/2015 0339   GLUCOSE 124* 11/21/2011 0416   BUN 15 04/27/2015 0339   BUN 13 11/21/2011 0416   CREATININE 1.12* 04/27/2015 0339   CREATININE 1.2* 01/21/2015   CREATININE 0.95 11/21/2011 0416   CALCIUM 8.3* 04/27/2015 0339   CALCIUM 7.7* 11/21/2011 0416   GFRNONAA 44* 04/27/2015 0339   GFRNONAA 56* 11/21/2011 0416   GFRAA 51* 04/27/2015 0339   GFRAA >60 11/21/2011 0416    Lipid Panel     Component Value Date/Time   LDLCALC 98 08/14/2013    CBC    Component Value Date/Time   WBC 2.9* 04/27/2015 0339   WBC 3.2 01/21/2015   WBC 3.4* 05/17/2012 1040   RBC 3.45* 04/27/2015 0339   RBC 3.94 05/17/2012 1040   HGB 9.2* 04/27/2015 0339   HGB 11.0* 05/17/2012 1040   HCT 28.6* 04/27/2015 0339   HCT 33.7* 05/17/2012 1040   PLT 53* 04/27/2015 0339   PLT 62* 05/17/2012 1040   MCV 83.0 04/27/2015 0339   MCV 86 05/17/2012 1040   MCH 26.8 04/27/2015 0339   MCH 28.0 05/17/2012 1040   MCHC 32.3 04/27/2015 0339   MCHC 32.7 05/17/2012 1040   RDW 15.5* 04/27/2015 0339   RDW 16.2* 05/17/2012 1040   LYMPHSABS 0.7* 04/26/2015 2227   LYMPHSABS 0.6* 05/17/2012 1040   MONOABS 0.4 04/26/2015 2227   MONOABS 0.3 05/17/2012 1040   EOSABS 0.1 04/26/2015 2227   EOSABS 0.1 05/17/2012 1040   BASOSABS 0.0 04/26/2015 2227   BASOSABS 0.0 05/17/2012 1040    Hgb A1C Lab Results  Component Value Date    HGBA1C 9.3 08/08/2015         Assessment & Plan:   Anal fissure:  Improved Stop Nitro Ointment  Rectal leakage with maceration of surrounding skin:  Discussed  cleaning well after BM with wet wipe Decrease Senna S to 1 tab BID Apply Dermagran BID and prn  Will reassess as needed

## 2015-09-26 ENCOUNTER — Encounter: Payer: Self-pay | Admitting: Internal Medicine

## 2015-09-26 ENCOUNTER — Non-Acute Institutional Stay: Payer: Medicare Other | Admitting: Internal Medicine

## 2015-09-26 VITALS — BP 98/52 | HR 76 | Temp 99.1°F | Resp 16 | Wt 190.8 lb

## 2015-09-26 DIAGNOSIS — I10 Essential (primary) hypertension: Secondary | ICD-10-CM | POA: Diagnosis not present

## 2015-09-26 DIAGNOSIS — F339 Major depressive disorder, recurrent, unspecified: Secondary | ICD-10-CM

## 2015-09-26 DIAGNOSIS — K602 Anal fissure, unspecified: Secondary | ICD-10-CM

## 2015-09-26 DIAGNOSIS — K7581 Nonalcoholic steatohepatitis (NASH): Secondary | ICD-10-CM | POA: Diagnosis not present

## 2015-09-26 DIAGNOSIS — D61818 Other pancytopenia: Secondary | ICD-10-CM

## 2015-09-26 DIAGNOSIS — E1149 Type 2 diabetes mellitus with other diabetic neurological complication: Secondary | ICD-10-CM

## 2015-09-26 LAB — HM DIABETES FOOT EXAM

## 2015-09-26 NOTE — Progress Notes (Signed)
Subjective:    Patient ID: Veronica Stafford, female    DOB: [birth year], 82 y.o.   MRN: [id number]  HPI Visit for follow up of chronic health conditions Reviewed status with [name] RN  Ongoing problems with the anal fissure Is set up for GI evaluation tomorrow Still bothered by hernia--but doesn't seem to be any different  Mood is okay No problems with the depression No hallucinations  No abnormal bleeding other than from the fissure No fevers No other systemic symptoms  Sugars have been okay No hypoglycemic reactions Leg pain mostly controlled Trouble with walking "my brain has to tell my feet where to step"  Back pain is better Satisfied with the lidocaine patch  Continues on the omeprazole This controls the heartburn No dysphagia  Current Outpatient Prescriptions on File Prior to Visit  Medication Sig Dispense Refill  . allopurinol (ZYLOPRIM) 300 MG tablet TAKE 1 TABLET BY MOUTH ONCE DAILY FOR GOUT 30 tablet 11  . bisacodyl (DULCOLAX) 5 MG EC tablet Take 5 mg by mouth every other day as needed for moderate constipation.    . clindamycin (CLEOCIN) 300 MG capsule Take 1 capsule (300 mg total) by mouth 3 (three) times daily. 15 capsule 0  . clonazePAM (KLONOPIN) 0.5 MG tablet Take 0.5 mg by mouth at bedtime. And bid prn    . cyanocobalamin 1000 MCG tablet Take 100 mcg by mouth daily.    Marland Kitchen desonide (DESOWEN) 0.05 % ointment Apply 1 application topically 3 (three) times daily as needed. Reported on 08/21/2015    . ergocalciferol (VITAMIN D2) 50000 UNITS capsule Take 50,000 Units by mouth every 30 (thirty) days.      Marland Kitchen FLUoxetine (PROZAC) 40 MG capsule Take 1 capsule (40 mg total) by mouth daily. 30 capsule 11  . furosemide (LASIX) 80 MG tablet Take 80 mg by mouth daily.      Marland Kitchen gabapentin (NEURONTIN) 300 MG capsule Take 2 capsules (600 mg total) by mouth 3 (three) times daily. Dx: E11.42 180 capsule 11  . meclizine (ANTIVERT) 25 MG tablet Take 25 mg by mouth 3 (three) times daily  as needed for dizziness. Reported on 08/21/2015    . Multiple Vitamins-Minerals (MULTIVITAMIN WITH MINERALS) tablet Take 1 tablet by mouth daily.     . mupirocin ointment (BACTROBAN) 2 % Place 1 application into the nose 2 (two) times daily. Reported on 08/21/2015    . omeprazole (PRILOSEC) 20 MG capsule TAKE ONE CAPSULE BY MOUTH TWICE A DAY. *DO NOT CRUSH* 60 capsule 11  . risperiDONE (RISPERDAL) 1 MG tablet Take 2 tablets (2 mg total) by mouth at bedtime. 30 tablet 11  . sennosides-docusate sodium (SENOKOT-S) 8.6-50 MG tablet Take 1 tablet by mouth 2 (two) times daily.    . traMADol (ULTRAM) 50 MG tablet Take 100 mg by mouth 3 (three) times daily.    Marland Kitchen zolpidem (AMBIEN) 10 MG tablet Take 10 mg by mouth at bedtime as needed for sleep.     No current facility-administered medications on file prior to visit.    Allergies  Allergen Reactions  . Codeine   . Demerol   . Erythromycin   . Fentanyl   . Flagyl [Metronidazole Hcl]   . Morphine And Related     Past Medical History  Diagnosis Date  . NASH (nonalcoholic steatohepatitis)   . Vitamin B12 deficiency   . Psychosis     paranoid hallucinations  . Pancytopenia     Dr Danie Chandler infusions  . Arthritis   .  Anxiety   . Hypertension   . GERD (gastroesophageal reflux disease)   . Diabetes mellitus   . Osteoporosis   . Depression   . Lumbar disc disease   . Wrist fracture, left 9/12    Past Surgical History  Procedure Laterality Date  . Joint replacement      TKR bilateral  . Elbow surgery      4 on each  . Mandible surgery      twice  . Abdominal hysterectomy    . Cholecystectomy    . Breast surgery      breast reduction  . Hemorrhoid surgery    . Right ear lesion removal    . Fixation kyphoplasty  1/12  . Cataract extraction w/ intraocular lens  implant, bilateral    . Revision of tkr  9/13    Cartilage replaced    Family History  Problem Relation Age of Onset  . Cancer Mother     colon  . Heart disease  Father     Social History   Social History  . Marital Status: Widowed    Spouse Name: N/A  . Number of Children: N/A  . Years of Education: N/A   Occupational History  . Nurse, retired     retired   Social History Main Topics  . Smoking status: Never Smoker   . Smokeless tobacco: Never Used  . Alcohol Use: No  . Drug Use: No  . Sexual Activity: Not on file   Other Topics Concern  . Not on file   Social History Narrative   1 son nearby--he is to make medical decisions   2 daughters   Has DNR   Would not accept tube feeds    Review of Systems Sleeping well Appetite is not great--has lost a few pounds Bowels are pretty good    Objective:   Physical Exam  Constitutional: She appears well-developed and well-nourished. No distress.  Neck: Normal range of motion. Neck supple. No thyromegaly present.  Cardiovascular: Normal rate, regular rhythm and normal heart sounds.  Exam reveals no gallop.   No murmur heard. Faint pedal pulses  Pulmonary/Chest: Effort normal and breath sounds normal. No respiratory distress. She has no wheezes. She has no rales.  Abdominal: Soft. There is no tenderness.  Musculoskeletal: She exhibits no edema.  Lymphadenopathy:    She has no cervical adenopathy.  Neurological:  Decreased sensation in feet  Skin:  No foot lesions Multiple eccymoses on both arms  Psychiatric:  Somewhat flat but her usual No active depression or thought process disturbance          Assessment & Plan:

## 2015-09-26 NOTE — Assessment & Plan Note (Signed)
No evidence of cirrhosis --other than possible effects on blood counts

## 2015-09-26 NOTE — Assessment & Plan Note (Signed)
BP Readings from Last 3 Encounters:  09/26/15 98/52  08/21/15 122/58  08/14/15 121/68   Tends to run low now

## 2015-09-26 NOTE — Assessment & Plan Note (Signed)
Ongoing symptoms Seeing GI tomorrow ?CCB cream? She really doesn't want to consider surgery (and best not with low platelets)

## 2015-09-26 NOTE — Assessment & Plan Note (Addendum)
Control has been reasonable given her overall status Neuropathy pain controlled reasonably with the gabapentin

## 2015-09-26 NOTE — Assessment & Plan Note (Signed)
Seems to be controlled on her meds Severe disease and psychosis---will not wean the risperdal

## 2015-09-26 NOTE — Assessment & Plan Note (Signed)
Apparent easy bruising Platelet count in 50K range Not really looking for Rx but if any increased issues, will set back up with hematology

## 2015-10-18 ENCOUNTER — Non-Acute Institutional Stay: Payer: Medicare Other | Admitting: Internal Medicine

## 2015-10-18 VITALS — BP 108/88 | HR 78 | Resp 18

## 2015-10-18 DIAGNOSIS — T148 Other injury of unspecified body region: Secondary | ICD-10-CM | POA: Diagnosis not present

## 2015-10-18 DIAGNOSIS — M79675 Pain in left toe(s): Secondary | ICD-10-CM

## 2015-10-18 DIAGNOSIS — T148XXA Other injury of unspecified body region, initial encounter: Secondary | ICD-10-CM

## 2015-10-18 DIAGNOSIS — B351 Tinea unguium: Secondary | ICD-10-CM

## 2015-10-18 NOTE — Progress Notes (Signed)
Subjective:    Patient ID: Veronica Stafford, female    DOB: 1929-02-05, 82 y.o.   MRN: [id number]  HPI  Asked to evaluate resident in [address] C/o left great toe pain x 3 days. Has noticed bruise to tip of toe. No known injury. Not interfering with gait. Has not tried anything for this.  Also requesting nail trim. History of fungal infection. Nails thick, growing down into nailbed causing pain.  Review of Systems      Past Medical History:  Diagnosis Date  . Anxiety   . Arthritis   . Depression   . Diabetes mellitus   . GERD (gastroesophageal reflux disease)   . Hypertension   . Lumbar disc disease   . NASH (nonalcoholic steatohepatitis)   . Osteoporosis   . Pancytopenia    Dr Danie Chandler infusions  . Psychosis    paranoid hallucinations  . Vitamin B12 deficiency   . Wrist fracture, left 9/12    Current Outpatient Prescriptions  Medication Sig Dispense Refill  . allopurinol (ZYLOPRIM) 300 MG tablet TAKE 1 TABLET BY MOUTH ONCE DAILY FOR GOUT 30 tablet 11  . bisacodyl (DULCOLAX) 5 MG EC tablet Take 5 mg by mouth every other day as needed for moderate constipation.    . clonazePAM (KLONOPIN) 0.5 MG tablet Take 0.5 mg by mouth at bedtime. And bid prn    . cyanocobalamin 1000 MCG tablet Take 100 mcg by mouth daily.    Marland Kitchen desonide (DESOWEN) 0.05 % ointment Apply 1 application topically 3 (three) times daily as needed. Reported on 08/21/2015    . ergocalciferol (VITAMIN D2) 50000 UNITS capsule Take 50,000 Units by mouth every 30 (thirty) days.      Marland Kitchen FLUoxetine (PROZAC) 40 MG capsule Take 1 capsule (40 mg total) by mouth daily. 30 capsule 11  . furosemide (LASIX) 80 MG tablet Take 80 mg by mouth daily.      Marland Kitchen gabapentin (NEURONTIN) 300 MG capsule Take 2 capsules (600 mg total) by mouth 3 (three) times daily. Dx: E11.42 180 capsule 11  . Multiple Vitamins-Minerals (MULTIVITAMIN WITH MINERALS) tablet Take 1 tablet by mouth daily.     Marland Kitchen omeprazole (PRILOSEC) 20 MG capsule TAKE ONE  CAPSULE BY MOUTH TWICE A DAY. *DO NOT CRUSH* 60 capsule 11  . risperiDONE (RISPERDAL) 1 MG tablet Take 2 tablets (2 mg total) by mouth at bedtime. 30 tablet 11  . sennosides-docusate sodium (SENOKOT-S) 8.6-50 MG tablet Take 1 tablet by mouth 2 (two) times daily.    . traMADol (ULTRAM) 50 MG tablet Take 100 mg by mouth 3 (three) times daily.    Marland Kitchen zolpidem (AMBIEN) 10 MG tablet Take 10 mg by mouth at bedtime as needed for sleep.    . clindamycin (CLEOCIN) 300 MG capsule Take 1 capsule (300 mg total) by mouth 3 (three) times daily. 15 capsule 0  . meclizine (ANTIVERT) 25 MG tablet Take 25 mg by mouth 3 (three) times daily as needed for dizziness. Reported on 08/21/2015    . mupirocin ointment (BACTROBAN) 2 % Place 1 application into the nose 2 (two) times daily. Reported on 08/21/2015     No current facility-administered medications for this visit.     Allergies  Allergen Reactions  . Codeine   . Demerol   . Erythromycin   . Fentanyl   . Flagyl [Metronidazole Hcl]   . Morphine And Related     Family History  Problem Relation Age of Onset  . Cancer Mother  colon  . Heart disease Father     Social History   Social History  . Marital status: Widowed    Spouse name: N/A  . Number of children: N/A  . Years of education: N/A   Occupational History  . Nurse, retired     retired   Social History Main Topics  . Smoking status: Never Smoker  . Smokeless tobacco: Never Used  . Alcohol use No  . Drug use: No  . Sexual activity: Not on file   Other Topics Concern  . Not on file   Social History Narrative   1 son nearby--he is to make medical decisions   2 daughters   Has DNR   Would not accept tube feeds     Constitutional: Denies fever, malaise, fatigue, headache or abrupt weight changes.  Skin: Pt reports bruise on left great toe, thick, long toenails. Denies redness, rashes, lesions or ulcercations.   No other specific complaints in a complete review of systems  (except as listed in HPI above).  Objective:   Physical Exam  BP 108/88   Pulse 78   Resp 18  Wt Readings from Last 3 Encounters:  09/26/15 190 lb 12.8 oz (86.5 kg)  06/13/15 193 lb (87.5 kg)  04/27/15 192 lb 4.8 oz (87.2 kg)    General: Appears her stated age, well developed, well nourished in NAD. Skin: 1 cm round bruise noted on tip of left great toe. No warm or red. Thick, mycotic discolored toenails, growing down and around, digging into the tips of the toes.  BMET    Component Value Date/Time   NA 140 04/27/2015 0339   NA 141 11/21/2011 0416   K 3.4 (L) 04/27/2015 0339   K 3.6 11/21/2011 0416   CL 103 04/27/2015 0339   CL 107 11/21/2011 0416   CO2 32 04/27/2015 0339   CO2 25 11/21/2011 0416   GLUCOSE 113 (H) 04/27/2015 0339   GLUCOSE 124 (H) 11/21/2011 0416   BUN 15 04/27/2015 0339   BUN 13 11/21/2011 0416   CREATININE 1.12 (H) 04/27/2015 0339   CREATININE 0.95 11/21/2011 0416   CALCIUM 8.3 (L) 04/27/2015 0339   CALCIUM 7.7 (L) 11/21/2011 0416   GFRNONAA 44 (L) 04/27/2015 0339   GFRNONAA 56 (L) 11/21/2011 0416   GFRAA 51 (L) 04/27/2015 0339   GFRAA >60 11/21/2011 0416    Lipid Panel     Component Value Date/Time   LDLCALC 98 08/14/2013    CBC    Component Value Date/Time   WBC 2.9 (L) 04/27/2015 0339   RBC 3.45 (L) 04/27/2015 0339   HGB 9.2 (L) 04/27/2015 0339   HGB 11.0 (L) 05/17/2012 1040   HCT 28.6 (L) 04/27/2015 0339   HCT 33.7 (L) 05/17/2012 1040   PLT 53 (L) 04/27/2015 0339   PLT 62 (L) 05/17/2012 1040   MCV 83.0 04/27/2015 0339   MCV 86 05/17/2012 1040   MCH 26.8 04/27/2015 0339   MCHC 32.3 04/27/2015 0339   RDW 15.5 (H) 04/27/2015 0339   RDW 16.2 (H) 05/17/2012 1040   LYMPHSABS 0.7 (L) 04/26/2015 2227   LYMPHSABS 0.6 (L) 05/17/2012 1040   MONOABS 0.4 04/26/2015 2227   MONOABS 0.3 05/17/2012 1040   EOSABS 0.1 04/26/2015 2227   EOSABS 0.1 05/17/2012 1040   BASOSABS 0.0 04/26/2015 2227   BASOSABS 0.0 05/17/2012 1040    Hgb A1C Lab  Results  Component Value Date   HGBA1C 9.3 08/08/2015  Assessment & Plan:   DTI left great toe:  Avoid pressure No intervention needed Monitor  Mycotic toenails:  Trimmed by provider using dremmel tool  Will reassess as needed Webb Silversmith, NP

## 2015-10-18 NOTE — Patient Instructions (Signed)

## 2015-11-04 ENCOUNTER — Encounter (INDEPENDENT_AMBULATORY_CARE_PROVIDER_SITE_OTHER): Payer: Self-pay

## 2015-11-04 ENCOUNTER — Inpatient Hospital Stay: Payer: Medicare Other | Attending: Oncology | Admitting: Oncology

## 2015-11-04 ENCOUNTER — Encounter: Payer: Self-pay | Admitting: Radiation Oncology

## 2015-11-04 ENCOUNTER — Encounter: Payer: Self-pay | Admitting: Oncology

## 2015-11-04 ENCOUNTER — Inpatient Hospital Stay: Payer: Medicare Other

## 2015-11-04 ENCOUNTER — Other Ambulatory Visit: Payer: Self-pay | Admitting: *Deleted

## 2015-11-04 ENCOUNTER — Ambulatory Visit
Admission: RE | Admit: 2015-11-04 | Discharge: 2015-11-04 | Disposition: A | Discharge status: Home / Self Care | Payer: Medicare Other | Source: Ambulatory Visit | Attending: Radiation Oncology | Admitting: Radiation Oncology

## 2015-11-04 VITALS — BP 120/71 | HR 85 | Temp 97.6°F | Resp 18 | Wt 189.4 lb

## 2015-11-04 DIAGNOSIS — F329 Major depressive disorder, single episode, unspecified: Secondary | ICD-10-CM | POA: Insufficient documentation

## 2015-11-04 DIAGNOSIS — Z809 Family history of malignant neoplasm, unspecified: Secondary | ICD-10-CM | POA: Insufficient documentation

## 2015-11-04 DIAGNOSIS — D61818 Other pancytopenia: Secondary | ICD-10-CM | POA: Insufficient documentation

## 2015-11-04 DIAGNOSIS — F419 Anxiety disorder, unspecified: Secondary | ICD-10-CM | POA: Diagnosis not present

## 2015-11-04 DIAGNOSIS — E119 Type 2 diabetes mellitus without complications: Secondary | ICD-10-CM | POA: Diagnosis not present

## 2015-11-04 DIAGNOSIS — C210 Malignant neoplasm of anus, unspecified: Secondary | ICD-10-CM | POA: Diagnosis not present

## 2015-11-04 DIAGNOSIS — M129 Arthropathy, unspecified: Secondary | ICD-10-CM | POA: Diagnosis not present

## 2015-11-04 DIAGNOSIS — M818 Other osteoporosis without current pathological fracture: Secondary | ICD-10-CM | POA: Insufficient documentation

## 2015-11-04 DIAGNOSIS — Z79899 Other long term (current) drug therapy: Secondary | ICD-10-CM | POA: Insufficient documentation

## 2015-11-04 DIAGNOSIS — D509 Iron deficiency anemia, unspecified: Secondary | ICD-10-CM | POA: Diagnosis not present

## 2015-11-04 DIAGNOSIS — K219 Gastro-esophageal reflux disease without esophagitis: Secondary | ICD-10-CM | POA: Insufficient documentation

## 2015-11-04 DIAGNOSIS — M1990 Unspecified osteoarthritis, unspecified site: Secondary | ICD-10-CM | POA: Insufficient documentation

## 2015-11-04 DIAGNOSIS — Z510 Encounter for antineoplastic radiation therapy: Secondary | ICD-10-CM | POA: Insufficient documentation

## 2015-11-04 DIAGNOSIS — E538 Deficiency of other specified B group vitamins: Secondary | ICD-10-CM | POA: Insufficient documentation

## 2015-11-04 DIAGNOSIS — I10 Essential (primary) hypertension: Secondary | ICD-10-CM | POA: Insufficient documentation

## 2015-11-04 DIAGNOSIS — Z8781 Personal history of (healed) traumatic fracture: Secondary | ICD-10-CM | POA: Insufficient documentation

## 2015-11-04 DIAGNOSIS — Z993 Dependence on wheelchair: Secondary | ICD-10-CM | POA: Insufficient documentation

## 2015-11-04 DIAGNOSIS — D696 Thrombocytopenia, unspecified: Secondary | ICD-10-CM | POA: Diagnosis not present

## 2015-11-04 DIAGNOSIS — K7581 Nonalcoholic steatohepatitis (NASH): Secondary | ICD-10-CM | POA: Insufficient documentation

## 2015-11-04 LAB — FERRITIN: Ferritin: 18 ng/mL (ref 11–307)

## 2015-11-04 LAB — CBC
HEMATOCRIT: 31.9 % — AB (ref 35.0–47.0)
Hemoglobin: 10.3 g/dL — ABNORMAL LOW (ref 12.0–16.0)
MCH: 25.4 pg — AB (ref 26.0–34.0)
MCHC: 32.3 g/dL (ref 32.0–36.0)
MCV: 78.6 fL — AB (ref 80.0–100.0)
Platelets: 77 10*3/uL — ABNORMAL LOW (ref 150–440)
RBC: 4.06 MIL/uL (ref 3.80–5.20)
RDW: 16.6 % — ABNORMAL HIGH (ref 11.5–14.5)
WBC: 4 10*3/uL (ref 3.6–11.0)

## 2015-11-04 LAB — IRON AND TIBC
Iron: 29 ug/dL (ref 28–170)
SATURATION RATIOS: 7 % — AB (ref 10.4–31.8)
TIBC: 407 ug/dL (ref 250–450)
UIBC: 378 ug/dL

## 2015-11-04 LAB — LACTATE DEHYDROGENASE: LDH: 127 U/L (ref 98–192)

## 2015-11-04 LAB — FOLATE: FOLATE: 95.7 ng/mL (ref >5.9)

## 2015-11-04 LAB — VITAMIN B12: VITAMIN B 12: 879 pg/mL (ref 180–914)

## 2015-11-04 MED ORDER — PROCHLORPERAZINE MALEATE 10 MG PO TABS
10.0000 mg | ORAL_TABLET | Freq: Four times a day (QID) | ORAL | 3 refills | Status: AC | PRN
Start: 2015-11-04 — End: ?

## 2015-11-04 MED ORDER — HYDROCODONE-ACETAMINOPHEN 5-325 MG PO TABS: 1.0000 | ORAL_TABLET | Freq: Four times a day (QID) | ORAL | 0 refills | Status: AC | PRN

## 2015-11-04 NOTE — Progress Notes (Signed)
[facility]  [phone] [phone]) [phone]  ID: Veronica Stafford OB: January 26, 1929  MR#: [id number]  [id number]  Patient Care Team: Venia Carbon, MD as PCP - General  CHIEF COMPLAINT: Anal cancer, thrombocytopenia, iron deficiency anemia.  INTERVAL HISTORY: Patient is an 82 year old female who was complaining of progressive rectal pain and also had bright red blood per rectum. Subsequent workup revealed an anal mass that was consistent with squamous cell carcinoma. Currently, she continues to have pain but otherwise feels well. She has no neurologic complaints. She denies any other bleeding or easy bruising. She has a good appetite and denies weight loss. She denies any recent fevers or illnesses. She has no chest pain or shortness of breath. She denies any nausea, vomiting, constipation, or diarrhea. She has no urinary complaints. Patient otherwise feels well and offers no further specific complaints.  REVIEW OF SYSTEMS:   Review of Systems  Constitutional: Negative for fever, malaise/fatigue and weight loss.  Respiratory: Negative.   Cardiovascular: Negative.   Gastrointestinal: Positive for abdominal pain and blood in stool. Negative for constipation, diarrhea, melena, nausea and vomiting.  Genitourinary: Negative.   Musculoskeletal: Negative.   Neurological: Negative.  Negative for weakness.  Psychiatric/Behavioral: The patient is nervous/anxious.     As per HPI. Otherwise, a complete review of systems is negatve.  PAST MEDICAL HISTORY: Past Medical History:  Diagnosis Date  . Anxiety   . Arthritis   . Depression   . Diabetes mellitus   . GERD (gastroesophageal reflux disease)   . Hypertension   . Lumbar disc disease   . NASH (nonalcoholic steatohepatitis)   . Osteoporosis   . Pancytopenia    Dr Danie Chandler infusions  . Psychosis    paranoid hallucinations  . Vitamin B12 deficiency   . Wrist fracture, left 9/12    PAST SURGICAL  HISTORY: Past Surgical History:  Procedure Laterality Date  . ABDOMINAL HYSTERECTOMY    . BREAST SURGERY     breast reduction  . CATARACT EXTRACTION W/ INTRAOCULAR LENS  IMPLANT, BILATERAL    . CHOLECYSTECTOMY    . ELBOW SURGERY     4 on each  . FIXATION KYPHOPLASTY  1/12  . HEMORRHOID SURGERY    . JOINT REPLACEMENT     TKR bilateral  . MANDIBLE SURGERY     twice  . Revision of TKR  9/13   Cartilage replaced  . Right ear lesion removal      FAMILY HISTORY: Family History  Problem Relation Age of Onset  . Cancer Mother     colon  . Heart disease Father        ADVANCED DIRECTIVES (Y/N):  N   HEALTH MAINTENANCE: Social History  Substance Use Topics  . Smoking status: Never Smoker  . Smokeless tobacco: Never Used  . Alcohol use No     Colonoscopy:  PAP:  Bone density:  Lipid panel:  Allergies  Allergen Reactions  . Cimetidine     Other reaction(s): Unknown  . Ciprofloxacin     Other reaction(s): Unknown  . Metoclopramide     Other reaction(s): Other (See Comments) SEVERE DEPRESSION  . Propoxyphene     Other reaction(s): Unknown  . Codeine   . Demerol   . Erythromycin   . Fentanyl   . Flagyl [Metronidazole Hcl]   . Latex Rash  . Morphine And Related     Current Outpatient Prescriptions  Medication Sig Dispense Refill  . allopurinol (ZYLOPRIM) 300 MG tablet TAKE 1  TABLET BY MOUTH ONCE DAILY FOR GOUT 30 tablet 11  . aluminum hydroxide (DERMAGRAN) ointment Apply topically daily.    . bisacodyl (DULCOLAX) 5 MG EC tablet Take 5 mg by mouth every other day as needed for moderate constipation.    . clindamycin (CLEOCIN) 300 MG capsule Take 1 capsule (300 mg total) by mouth 3 (three) times daily. (Patient not taking: Reported on 11/04/2015) 15 capsule 0  . clonazePAM (KLONOPIN) 0.5 MG tablet Take 0.5 mg by mouth at bedtime. And bid prn    . cyanocobalamin 1000 MCG tablet Take 100 mcg by mouth daily.    Marland Kitchen desonide (DESOWEN) 0.05 % ointment Apply topically.     . ergocalciferol (VITAMIN D2) 50000 UNITS capsule Take 50,000 Units by mouth every 30 (thirty) days.      Marland Kitchen FLUoxetine (PROZAC) 40 MG capsule Take 1 capsule (40 mg total) by mouth daily. 30 capsule 11  . furosemide (LASIX) 80 MG tablet Take by mouth.    . gabapentin (NEURONTIN) 300 MG capsule Take 2 capsules (600 mg total) by mouth 3 (three) times daily. Dx: E11.42 180 capsule 11  . HYDROcodone-acetaminophen (NORCO/VICODIN) 5-325 MG tablet Take 1 tablet by mouth every 6 (six) hours as needed for moderate pain. 30 tablet 0  . hydrocortisone 2.5 % cream Apply topically.    . lidocaine (LIDODERM) 5 % Place onto the skin.    Marland Kitchen lidocaine (XYLOCAINE) 2 % jelly Apply topically.    . meclizine (ANTIVERT) 25 MG tablet Take 25 mg by mouth 3 (three) times daily as needed for dizziness. Reported on 08/21/2015    . Multiple Vitamins-Minerals (MULTIVITAMIN WITH MINERALS) tablet Take 1 tablet by mouth daily.     . mupirocin ointment (BACTROBAN) 2 % Place 1 application into the nose 2 (two) times daily. Reported on 08/21/2015    . NITRO-BID 2 % ointment   0  . omeprazole (PRILOSEC) 20 MG capsule TAKE ONE CAPSULE BY MOUTH TWICE A DAY. *DO NOT CRUSH* 60 capsule 11  . polyethylene glycol (MIRALAX / GLYCOLAX) packet Take by mouth.    . prochlorperazine (COMPAZINE) 10 MG tablet Take 1 tablet (10 mg total) by mouth every 6 (six) hours as needed. Take 30 min prior to taking pain medication to prevent nausea, vomiting. 30 tablet 3  . risperiDONE (RISPERDAL) 1 MG tablet Take 2 tablets (2 mg total) by mouth at bedtime. 30 tablet 11  . sennosides-docusate sodium (SENOKOT-S) 8.6-50 MG tablet Take 1 tablet by mouth 2 (two) times daily.    . traMADol (ULTRAM) 50 MG tablet Take 100 mg by mouth 3 (three) times daily.    Marland Kitchen zolpidem (AMBIEN) 10 MG tablet Take 10 mg by mouth at bedtime as needed for sleep.     No current facility-administered medications for this visit.     OBJECTIVE: There were no vitals filed for this visit.    There is no height or weight on file to calculate BMI.    ECOG FS:0 - Asymptomatic  General: Well-developed, well-nourished, no acute distress. Eyes: Pink conjunctiva, anicteric sclera. HEENT: Normocephalic, moist mucous membranes, clear oropharnyx. Lungs: Clear to auscultation bilaterally. Heart: Regular rate and rhythm. No rubs, murmurs, or gallops. Abdomen: Soft, nontender, nondistended. No organomegaly noted, normoactive bowel sounds. Musculoskeletal: No edema, cyanosis, or clubbing. Neuro: Alert, answering all questions appropriately. Cranial nerves grossly intact. Skin: No rashes or petechiae noted. Psych: Normal affect. Lymphatics: No cervical, calvicular, axillary or inguinal LAD.   LAB RESULTS:  Lab Results  Component Value  Date   NA 140 04/27/2015   K 3.4 (L) 04/27/2015   CL 103 04/27/2015   CO2 32 04/27/2015   GLUCOSE 113 (H) 04/27/2015   BUN 15 04/27/2015   CREATININE 1.12 (H) 04/27/2015   CALCIUM 8.3 (L) 04/27/2015   PROT 6.8 04/26/2015   ALBUMIN 3.6 04/26/2015   AST 29 04/26/2015   ALT 17 04/26/2015   ALKPHOS 95 04/26/2015   BILITOT 0.4 04/26/2015   GFRNONAA 44 (L) 04/27/2015   GFRAA 51 (L) 04/27/2015    Lab Results  Component Value Date   WBC 4.0 11/04/2015   NEUTROABS 2.1 04/26/2015   HGB 10.3 (L) 11/04/2015   HCT 31.9 (L) 11/04/2015   MCV 78.6 (L) 11/04/2015   PLT 77 (L) 11/04/2015   Lab Results  Component Value Date   IRON 29 11/04/2015   TIBC 407 11/04/2015   IRONPCTSAT 7 (L) 11/04/2015   Lab Results  Component Value Date   FERRITIN 18 11/04/2015     STUDIES: No results found.  ASSESSMENT: Anal cancer, thrombocytopenia, iron deficiency anemia.  PLAN:    1. Anal cancer: Patient also seen by radiation oncology today. She will have a CT scan on November 08, 2015 to complete staging workup and follow-up several days later to discuss the results and treatment planning. Unlikely we will be able to use chemotherapy given her persistent  thrombocytopenia. Patient will likely only receive single agent XRT. 2. Thrombocytopenia: Patient has mildly decreased iron stores as well as a positive platelet antibody that may be contributing. Can consider IV Feraheme as well as her short dose of prednisone to see if they improve. If platelets improved to greater than 100, would reconsider concurrent chemotherapy with her XRT. 3. Iron deficiency anemia: Patient has a decreased hemoglobin as well as decreased iron stores. Return to clinic on November 12, 2015 to receive IV Feraheme.   45 minutes was spent in discussion of which greater than 50% was consultation.   Patient expressed understanding and was in agreement with this plan. She also understands that She can call clinic at any time with any questions, concerns, or complaints.   No matching staging information was found for the patient.  Lloyd Huger, MD   11/07/2015 11:38 AM

## 2015-11-04 NOTE — Consult Note (Signed)
Except an outstanding is perfect of Radiation Oncology NEW PATIENT EVALUATION  Name: Veronica Stafford  MRN: [id number]  Date:   11/04/2015     DOB: [birth year]   This 82 y.o. female patient presents to the clinic for initial evaluation of anal cancer as yet on staged.  REFERRING PHYSICIAN: Venia Carbon, MD  CHIEF COMPLAINT:  Chief Complaint  Patient presents with  . Cancer    Pt is here for consultation of rectal cancer.      DIAGNOSIS: The encounter diagnosis was Anal cancer ([place]).   PREVIOUS INVESTIGATIONS:  Pathology report reviewed Clinical notes reviewed CT scan of abdomen and pelvis ordered  HPI: Patient is an 82 year old nursing home resident who was seen by general surgery for complaints of rectal pain and bleeding. She does have thrombocytopenia with platelets in the 50,000 range. She had and a scope performed and was noted to have an ulcerative lesion with bleeding in the region of the anus. Biopsy was positive for squamous cell carcinoma. Patient is referred to radiation oncology for opinion. She continues to have significant rectal pain as well as occasional bleeding. She's not had any formal workup. She is wheelchair-bound.  PLANNED TREATMENT REGIMEN: Possible palliative radiation therapy  PAST MEDICAL HISTORY:  has a past medical history of Anxiety; Arthritis; Depression; Diabetes mellitus; GERD (gastroesophageal reflux disease); Hypertension; Lumbar disc disease; NASH (nonalcoholic steatohepatitis); Osteoporosis; Pancytopenia; Psychosis; Vitamin B12 deficiency; and Wrist fracture, left (9/12).    PAST SURGICAL HISTORY:  Past Surgical History:  Procedure Laterality Date  . ABDOMINAL HYSTERECTOMY    . BREAST SURGERY     breast reduction  . CATARACT EXTRACTION W/ INTRAOCULAR LENS  IMPLANT, BILATERAL    . CHOLECYSTECTOMY    . ELBOW SURGERY     4 on each  . FIXATION KYPHOPLASTY  1/12  . HEMORRHOID SURGERY    . JOINT REPLACEMENT     TKR bilateral  . MANDIBLE  SURGERY     twice  . Revision of TKR  9/13   Cartilage replaced  . Right ear lesion removal      FAMILY HISTORY: family history includes Cancer in her mother; Heart disease in her father.  SOCIAL HISTORY:  reports that she has never smoked. She has never used smokeless tobacco. She reports that she does not drink alcohol or use drugs.  ALLERGIES: Cimetidine; Ciprofloxacin; Metoclopramide; Propoxyphene; Codeine; Demerol; Erythromycin; Fentanyl; Flagyl [metronidazole hcl]; Latex; and Morphine and related  MEDICATIONS:  Current Outpatient Prescriptions  Medication Sig Dispense Refill  . allopurinol (ZYLOPRIM) 300 MG tablet TAKE 1 TABLET BY MOUTH ONCE DAILY FOR GOUT 30 tablet 11  . aluminum hydroxide (DERMAGRAN) ointment Apply topically daily.    . bisacodyl (DULCOLAX) 5 MG EC tablet Take 5 mg by mouth every other day as needed for moderate constipation.    . clonazePAM (KLONOPIN) 0.5 MG tablet Take 0.5 mg by mouth at bedtime. And bid prn    . cyanocobalamin 1000 MCG tablet Take 100 mcg by mouth daily.    Marland Kitchen desonide (DESOWEN) 0.05 % ointment Apply topically.    . ergocalciferol (VITAMIN D2) 50000 UNITS capsule Take 50,000 Units by mouth every 30 (thirty) days.      Marland Kitchen FLUoxetine (PROZAC) 40 MG capsule Take 1 capsule (40 mg total) by mouth daily. 30 capsule 11  . furosemide (LASIX) 80 MG tablet Take by mouth.    . gabapentin (NEURONTIN) 300 MG capsule Take 2 capsules (600 mg total) by mouth 3 (three) times daily. Dx:  E11.42 180 capsule 11  . hydrocortisone 2.5 % cream Apply topically.    . lidocaine (LIDODERM) 5 % Place onto the skin.    Marland Kitchen lidocaine (XYLOCAINE) 2 % jelly Apply topically.    . Multiple Vitamins-Minerals (MULTIVITAMIN WITH MINERALS) tablet Take 1 tablet by mouth daily.     Marland Kitchen omeprazole (PRILOSEC) 20 MG capsule TAKE ONE CAPSULE BY MOUTH TWICE A DAY. *DO NOT CRUSH* 60 capsule 11  . polyethylene glycol (MIRALAX / GLYCOLAX) packet Take by mouth.    . risperiDONE (RISPERDAL) 1 MG  tablet Take 2 tablets (2 mg total) by mouth at bedtime. 30 tablet 11  . sennosides-docusate sodium (SENOKOT-S) 8.6-50 MG tablet Take 1 tablet by mouth 2 (two) times daily.    . traMADol (ULTRAM) 50 MG tablet Take 100 mg by mouth 3 (three) times daily.    Marland Kitchen zolpidem (AMBIEN) 10 MG tablet Take 10 mg by mouth at bedtime as needed for sleep.    . clindamycin (CLEOCIN) 300 MG capsule Take 1 capsule (300 mg total) by mouth 3 (three) times daily. (Patient not taking: Reported on 11/04/2015) 15 capsule 0  . HYDROcodone-acetaminophen (NORCO/VICODIN) 5-325 MG tablet Take 1 tablet by mouth every 6 (six) hours as needed for moderate pain. 30 tablet 0  . meclizine (ANTIVERT) 25 MG tablet Take 25 mg by mouth 3 (three) times daily as needed for dizziness. Reported on 08/21/2015    . mupirocin ointment (BACTROBAN) 2 % Place 1 application into the nose 2 (two) times daily. Reported on 08/21/2015    . NITRO-BID 2 % ointment   0  . prochlorperazine (COMPAZINE) 10 MG tablet Take 1 tablet (10 mg total) by mouth every 6 (six) hours as needed. Take 30 min prior to taking pain medication to prevent nausea, vomiting. 30 tablet 3   No current facility-administered medications for this encounter.     ECOG PERFORMANCE STATUS:  2 - Symptomatic, <50% confined to bed  REVIEW OF SYSTEMS: Patient is a poor historian but according to family except for the rectal pain and bleeding Patient denies any weight loss, fatigue, weakness, fever, chills or night sweats. Patient denies any loss of vision, blurred vision. Patient denies any ringing  of the ears or hearing loss. No irregular heartbeat. Patient denies heart murmur or history of fainting. Patient denies any chest pain or pain radiating to her upper extremities. Patient denies any shortness of breath, difficulty breathing at night, cough or hemoptysis. Patient denies any swelling in the lower legs. Patient denies any nausea vomiting, vomiting of blood, or coffee ground material in the  vomitus. Patient denies any stomach pain. Patient states has had normal bowel movements no significant constipation or diarrhea. Patient denies any dysuria, hematuria or significant nocturia. Patient denies any problems walking, swelling in the joints or loss of balance. Patient denies any skin changes, loss of hair or loss of weight. Patient denies any excessive worrying or anxiety or significant depression. Patient denies any problems with insomnia. Patient denies excessive thirst, polyuria, polydipsia. Patient denies any swollen glands, patient denies easy bruising or easy bleeding. Patient denies any recent infections, allergies or URI. Patient "s visual fields have not changed significantly in recent time.    PHYSICAL EXAM: BP 120/71   Pulse 85   Temp 97.6 F (36.4 C)   Resp 18   Wt 189 lb 6 oz (85.9 kg)   BMI 28.79 kg/m  Elderly female wheelchair-bound in NAD. Rectal exam was not performed. Well-developed well-nourished patient in NAD.  HEENT reveals PERLA, EOMI, discs not visualized.  Oral cavity is clear. No oral mucosal lesions are identified. Neck is clear without evidence of cervical or supraclavicular adenopathy. Lungs are clear to A&P. Cardiac examination is essentially unremarkable with regular rate and rhythm without murmur rub or thrill. Abdomen is benign with no organomegaly or masses noted. Motor sensory and DTR levels are equal and symmetric in the upper and lower extremities. Cranial nerves II through XII are grossly intact. Proprioception is intact. No peripheral adenopathy or edema is identified. No motor or sensory levels are noted. Crude visual fields are within normal range.  LABORATORY DATA: Pathology reports reviewed    RADIOLOGY RESULTS: CT scan with contrast of abdomen and pelvis ordered   IMPRESSION: Squamous cell carcinoma the anus in 82 year old female with thrombocytopenia.  PLAN: I discussed the case with medical oncology based on the patient's overall  pancytopenia chemotherapy may be difficult. I've ordered a CT scan of the abdomen and pelvis with contrast to evaluate extent of the disease and evaluation of her inguinal nodes as well as liver and extent of disease in the anal region. Despite any staging would offer palliative radiation therapy to stop pain and bleeding and dose and feels will depend a my findings on the time of review of her CT scan. Patient is seeing medical oncology today will make further reformations after CT scan is processed. Potential for low-dose chemotherapy has not been completely eliminated.There will be extra effort by both professional staff as well as technical staff to coordinate and manage concurrent chemoradiation and ensuing side effects during his treatments. Risks and benefits of treatment including skin irritation fatigue alteration of blood counts all were discussed with the patient and her family. They seem to compress my treatment plan well. I have personally set up and ordered CT simulation after the Labor Day holiday and will have her CT scan at that time for review and discussion.  I would like to take this opportunity to thank you for allowing me to participate in the care of your patient.Armstead Peaks., MD

## 2015-11-04 NOTE — Progress Notes (Signed)
Pt reports be anxious  About appt.

## 2015-11-05 ENCOUNTER — Inpatient Hospital Stay: Payer: Medicare Other | Admitting: Oncology

## 2015-11-06 LAB — PLATELET ANTIBODY PROFILE, SERUM
HLA AB SER QL EIA: NEGATIVE
IA/IIA Antibody: NEGATIVE
IB/IX ANTIBODY: NEGATIVE
IIB/IIIA Antibody: POSITIVE — AB

## 2015-11-07 DIAGNOSIS — D509 Iron deficiency anemia, unspecified: Secondary | ICD-10-CM | POA: Insufficient documentation

## 2015-11-08 ENCOUNTER — Ambulatory Visit: Payer: Medicare Other

## 2015-11-08 ENCOUNTER — Ambulatory Visit
Admission: RE | Admit: 2015-11-08 | Discharge: 2015-11-08 | Disposition: A | Discharge status: Home / Self Care | Payer: Medicare Other | Source: Ambulatory Visit | Attending: Radiation Oncology | Admitting: Radiation Oncology

## 2015-11-08 DIAGNOSIS — M4854XA Collapsed vertebra, not elsewhere classified, thoracic region, initial encounter for fracture: Secondary | ICD-10-CM | POA: Insufficient documentation

## 2015-11-08 DIAGNOSIS — K7460 Unspecified cirrhosis of liver: Secondary | ICD-10-CM | POA: Diagnosis not present

## 2015-11-08 DIAGNOSIS — M4856XA Collapsed vertebra, not elsewhere classified, lumbar region, initial encounter for fracture: Secondary | ICD-10-CM | POA: Diagnosis not present

## 2015-11-08 DIAGNOSIS — C210 Malignant neoplasm of anus, unspecified: Secondary | ICD-10-CM | POA: Insufficient documentation

## 2015-11-08 DIAGNOSIS — I700 Atherosclerosis of aorta: Secondary | ICD-10-CM | POA: Insufficient documentation

## 2015-11-08 DIAGNOSIS — I2510 Atherosclerotic heart disease of native coronary artery without angina pectoris: Secondary | ICD-10-CM | POA: Insufficient documentation

## 2015-11-08 DIAGNOSIS — M47816 Spondylosis without myelopathy or radiculopathy, lumbar region: Secondary | ICD-10-CM | POA: Insufficient documentation

## 2015-11-08 DIAGNOSIS — M8580 Other specified disorders of bone density and structure, unspecified site: Secondary | ICD-10-CM | POA: Diagnosis not present

## 2015-11-08 DIAGNOSIS — R590 Localized enlarged lymph nodes: Secondary | ICD-10-CM | POA: Diagnosis not present

## 2015-11-08 HISTORY — DX: Malignant (primary) neoplasm, unspecified: C80.1

## 2015-11-08 MED ORDER — IOPAMIDOL (ISOVUE-300) INJECTION 61%
80.0000 mL | Freq: Once | INTRAVENOUS | Status: AC | PRN
  Administered 2015-11-08: 80 mL via INTRAVENOUS

## 2015-11-11 NOTE — Progress Notes (Signed)
[facility]  [phone]) [phone] Fax:(336) [phone]  ID: RONETTE REBEL OB: 08-Nov-1928  MR#: [id number]  [id number]  Patient Care Team: Venia Carbon, MD as PCP - General  CHIEF COMPLAINT: Anal cancer, thrombocytopenia, iron deficiency anemia.  INTERVAL HISTORY: Patient returns to clinic today for further evaluation, discussion of her imaging results, and initiation of IV iron. She continues to have pain but otherwise feels well. She has no neurologic complaints. She denies any bleeding or easy bruising. She has a good appetite and denies weight loss. She denies any recent fevers or illnesses. She has no chest pain or shortness of breath. She denies any nausea, vomiting, constipation, or diarrhea. She has no urinary complaints. Patient offers no further specific complaints.  REVIEW OF SYSTEMS:   Review of Systems  Constitutional: Negative for fever, malaise/fatigue and weight loss.  Respiratory: Negative.   Cardiovascular: Negative.   Gastrointestinal: Positive for abdominal pain and blood in stool. Negative for constipation, diarrhea, melena, nausea and vomiting.  Genitourinary: Negative.   Musculoskeletal: Negative.   Neurological: Negative.  Negative for weakness.  Psychiatric/Behavioral: The patient is nervous/anxious.     As per HPI. Otherwise, a complete review of systems is negatve.  PAST MEDICAL HISTORY: Past Medical History:  Diagnosis Date  . Anxiety   . Arthritis   . Cancer [facility])    Anal cancer  . Depression   . Diabetes mellitus   . GERD (gastroesophageal reflux disease)   . Hypertension   . Lumbar disc disease   . NASH (nonalcoholic steatohepatitis)   . Osteoporosis   . Pancytopenia    Dr Danie Chandler infusions  . Psychosis    paranoid hallucinations  . Vitamin B12 deficiency   . Wrist fracture, left 9/12    PAST SURGICAL HISTORY: Past Surgical History:  Procedure Laterality Date  . ABDOMINAL HYSTERECTOMY    . BREAST  SURGERY     breast reduction  . CATARACT EXTRACTION W/ INTRAOCULAR LENS  IMPLANT, BILATERAL    . CHOLECYSTECTOMY    . ELBOW SURGERY     4 on each  . FIXATION KYPHOPLASTY  1/12  . HEMORRHOID SURGERY    . JOINT REPLACEMENT     TKR bilateral  . MANDIBLE SURGERY     twice  . Revision of TKR  9/13   Cartilage replaced  . Right ear lesion removal      FAMILY HISTORY: Family History  Problem Relation Age of Onset  . Cancer Mother     colon  . Heart disease Father        ADVANCED DIRECTIVES (Y/N):  N   HEALTH MAINTENANCE: Social History  Substance Use Topics  . Smoking status: Never Smoker  . Smokeless tobacco: Never Used  . Alcohol use No     Colonoscopy:  PAP:  Bone density:  Lipid panel:  Allergies  Allergen Reactions  . Cimetidine     Other reaction(s): Unknown  . Ciprofloxacin     Other reaction(s): Unknown  . Metoclopramide     Other reaction(s): Other (See Comments) SEVERE DEPRESSION  . Propoxyphene     Other reaction(s): Unknown  . Codeine   . Demerol   . Erythromycin   . Fentanyl   . Flagyl [Metronidazole Hcl]   . Latex Rash  . Morphine And Related     Current Outpatient Prescriptions  Medication Sig Dispense Refill  . allopurinol (ZYLOPRIM) 300 MG tablet TAKE 1 TABLET BY MOUTH ONCE DAILY FOR GOUT 30 tablet 11  .  aluminum hydroxide [facility]) ointment Apply topically daily.    . bisacodyl (DULCOLAX) 5 MG EC tablet Take 5 mg by mouth every other day as needed for moderate constipation.    . clonazePAM (KLONOPIN) 0.5 MG tablet Take 0.5 mg by mouth at bedtime. And bid prn    . cyanocobalamin 1000 MCG tablet Take 100 mcg by mouth daily.    Marland Kitchen desonide (DESOWEN) 0.05 % ointment Apply topically.    . ergocalciferol (VITAMIN D2) 50000 UNITS capsule Take 50,000 Units by mouth every 30 (thirty) days.      Marland Kitchen FLUoxetine (PROZAC) 40 MG capsule Take 1 capsule (40 mg total) by mouth daily. 30 capsule 11  . furosemide (LASIX) 80 MG tablet Take by mouth.    .  gabapentin (NEURONTIN) 300 MG capsule Take 2 capsules (600 mg total) by mouth 3 (three) times daily. Dx: E11.42 180 capsule 11  . HYDROcodone-acetaminophen (NORCO/VICODIN) 5-325 MG tablet Take 1 tablet by mouth every 6 (six) hours as needed for moderate pain. 30 tablet 0  . hydrocortisone 2.5 % cream Apply topically.    . lidocaine (LIDODERM) 5 % Place onto the skin.    Marland Kitchen lidocaine (XYLOCAINE) 2 % jelly Apply topically.    . meclizine (ANTIVERT) 25 MG tablet Take 25 mg by mouth 3 (three) times daily as needed for dizziness. Reported on 08/21/2015    . Multiple Vitamins-Minerals (MULTIVITAMIN WITH MINERALS) tablet Take 1 tablet by mouth daily.     . mupirocin ointment (BACTROBAN) 2 % Place 1 application into the nose 2 (two) times daily. Reported on 08/21/2015    . NITRO-BID 2 % ointment   0  . omeprazole (PRILOSEC) 20 MG capsule TAKE ONE CAPSULE BY MOUTH TWICE A DAY. *DO NOT CRUSH* 60 capsule 11  . polyethylene glycol (MIRALAX / GLYCOLAX) packet Take by mouth.    . prochlorperazine (COMPAZINE) 10 MG tablet Take 1 tablet (10 mg total) by mouth every 6 (six) hours as needed. Take 30 min prior to taking pain medication to prevent nausea, vomiting. 30 tablet 3  . risperiDONE (RISPERDAL) 1 MG tablet Take 2 tablets (2 mg total) by mouth at bedtime. 30 tablet 11  . sennosides-docusate sodium (SENOKOT-S) 8.6-50 MG tablet Take 1 tablet by mouth 2 (two) times daily.    . traMADol (ULTRAM) 50 MG tablet Take 100 mg by mouth 3 (three) times daily.    Marland Kitchen zolpidem (AMBIEN) 10 MG tablet Take 10 mg by mouth at bedtime as needed for sleep.    . clindamycin (CLEOCIN) 300 MG capsule Take 1 capsule (300 mg total) by mouth 3 (three) times daily. (Patient not taking: Reported on 11/12/2015) 15 capsule 0   No current facility-administered medications for this visit.     OBJECTIVE: Vitals:   11/12/15 1025  BP: 113/74  Pulse: 85  Resp: 20  Temp: 97.7 F (36.5 C)     There is no height or weight on file to calculate  BMI.    ECOG FS:0 - Asymptomatic  General: Well-developed, well-nourished, no acute distress. Eyes: Pink conjunctiva, anicteric sclera. Lungs: Clear to auscultation bilaterally. Heart: Regular rate and rhythm. No rubs, murmurs, or gallops. Abdomen: Soft, nontender, nondistended. No organomegaly noted, normoactive bowel sounds. Musculoskeletal: No edema, cyanosis, or clubbing. Neuro: Alert, answering all questions appropriately. Cranial nerves grossly intact. Skin: No rashes or petechiae noted. Psych: Normal affect.   LAB RESULTS:  Lab Results  Component Value Date   NA 140 04/27/2015   K 3.4 (L) 04/27/2015  CL 103 04/27/2015   CO2 32 04/27/2015   GLUCOSE 113 (H) 04/27/2015   BUN 15 04/27/2015   CREATININE 1.12 (H) 04/27/2015   CALCIUM 8.3 (L) 04/27/2015   PROT 6.8 04/26/2015   ALBUMIN 3.6 04/26/2015   AST 29 04/26/2015   ALT 17 04/26/2015   ALKPHOS 95 04/26/2015   BILITOT 0.4 04/26/2015   GFRNONAA 44 (L) 04/27/2015   GFRAA 51 (L) 04/27/2015    Lab Results  Component Value Date   WBC 4.0 11/04/2015   NEUTROABS 2.1 04/26/2015   HGB 10.3 (L) 11/04/2015   HCT 31.9 (L) 11/04/2015   MCV 78.6 (L) 11/04/2015   PLT 77 (L) 11/04/2015   Lab Results  Component Value Date   IRON 29 11/04/2015   TIBC 407 11/04/2015   IRONPCTSAT 7 (L) 11/04/2015   Lab Results  Component Value Date   FERRITIN 18 11/04/2015     STUDIES: Ct Abdomen Pelvis W Contrast  Result Date: 11/08/2015 CLINICAL DATA:  Anal cancer new diagnosis EXAM: CT ABDOMEN AND PELVIS WITH CONTRAST TECHNIQUE: Multidetector CT imaging of the abdomen and pelvis was performed using the standard protocol following bolus administration of intravenous contrast. CONTRAST:  63mL ISOVUE-300 IOPAMIDOL (ISOVUE-300) INJECTION 61% COMPARISON:  None. FINDINGS: Lower chest: No pleural effusion or pericardial effusion. There is aortic atherosclerosis identified. Calcification in the RCA, LAD and left circumflex Coronary arteries  identified. Hepatobiliary: The liver has a diffuse nodular contour compatible with cirrhosis. No suspicious liver abnormality noted. Previous cholecystectomy. No biliary dilatation. Pancreas: The pancreas is negative. Spleen: The spleen is enlarged measuring 15 cm. Adrenals/Urinary Tract: Normal appearance of the adrenal glands. Bilateral renal cysts are identified. No mass or hydronephrosis. The urinary bladder appears normal. Stomach/Bowel: The stomach is normal. The small bowel loops are unremarkable. Normal appearance of the colon. No pathologic dilatation of the large or small bowel loops. The anal lesion is difficult to visualize on this study. Vascular/Lymphatic: Calcified atherosclerotic disease involves the abdominal aorta. No aneurysm. No enlarged retroperitoneal or mesenteric adenopathy. Left external iliac node is enlarged measuring 1.3 cm, image 69 of series 2. This is new when compared with 10/12/2012. Reproductive: Previous hysterectomy.  No adnexal mass. Other: There is no ascites or focal fluid collections within the abdomen or pelvis. Musculoskeletal: The bones appear osteopenic. Multi level degenerative disc disease identified. Status post posterior decompression at L4-5. There is an anterolisthesis of L3 on L4. Treated compression fractures noted at T12. There is a mild compression deformity involving the L2 vertebra. IMPRESSION: 1. Solitary enlarged left external iliac lymph node measures 13 mm. Cannot rule out local/regional lymph node metastasis. 2. No evidence for abdominal lymph node metastasis or liver metastasis. 3. Cirrhosis of the liver. 4. Aortic atherosclerosis and multi vessel Coronary artery calcification 5. Advanced lumbar spondylosis 6. Osteopenia with thoracic and lumbar compression deformities. Electronically Signed   By: Kerby Moors M.D.   On: 11/08/2015 09:27    ASSESSMENT: Anal cancer, thrombocytopenia, iron deficiency anemia.  PLAN:    1. Anal cancer: CT scan  results reviewed independently and reported as above with suspicious left external iliac lymph node. After lengthy discussion with the patient and her family, given her advanced age and thrombocytopenia she likely would not tolerate chemotherapy. Patient does not have a desire to undergo chemotherapy either. Therefore, patient will proceed with radiation treatment only. Will repeat CT scan in approximately 8 weeks after completion of XRT to assess interval change and to reevaluate her suspicious left external iliac lymph node.  Return to clinic in 6 weeks near the end of radiation for further evaluation.  2. Thrombocytopenia: Patient has mildly decreased iron stores as well as a positive platelet antibody that may be contributing. Monitor. No chemotherapy as above. 3. Iron deficiency anemia: Patient's hemoglobin and iron stores are significantly decreased. Proceed with 510 mg IV Feraheme today and return to clinic in 1 week for a second infusion. Return to clinic as above for repeat laboratory work and further evaluation.  Approximately 30 minutes was spent in discussion of which greater than 50% was consultation.  Patient expressed understanding and was in agreement with this plan. She also understands that She can call clinic at any time with any questions, concerns, or complaints.   No matching staging information was found for the patient.  Lloyd Huger, MD   11/14/2015 9:06 AM

## 2015-11-12 ENCOUNTER — Inpatient Hospital Stay: Payer: Medicare Other

## 2015-11-12 ENCOUNTER — Inpatient Hospital Stay: Payer: Medicare Other | Attending: Oncology | Admitting: Oncology

## 2015-11-12 ENCOUNTER — Ambulatory Visit
Admission: RE | Admit: 2015-11-12 | Discharge: 2015-11-12 | Disposition: A | Discharge status: Home / Self Care | Payer: Medicare Other | Source: Ambulatory Visit | Attending: Radiation Oncology | Admitting: Radiation Oncology

## 2015-11-12 VITALS — BP 113/74 | HR 85 | Temp 97.7°F | Resp 20

## 2015-11-12 VITALS — BP 120/75 | HR 73

## 2015-11-12 DIAGNOSIS — D696 Thrombocytopenia, unspecified: Secondary | ICD-10-CM | POA: Diagnosis not present

## 2015-11-12 DIAGNOSIS — M818 Other osteoporosis without current pathological fracture: Secondary | ICD-10-CM | POA: Insufficient documentation

## 2015-11-12 DIAGNOSIS — F419 Anxiety disorder, unspecified: Secondary | ICD-10-CM | POA: Diagnosis not present

## 2015-11-12 DIAGNOSIS — K7581 Nonalcoholic steatohepatitis (NASH): Secondary | ICD-10-CM | POA: Diagnosis not present

## 2015-11-12 DIAGNOSIS — C210 Malignant neoplasm of anus, unspecified: Secondary | ICD-10-CM

## 2015-11-12 DIAGNOSIS — D509 Iron deficiency anemia, unspecified: Secondary | ICD-10-CM

## 2015-11-12 DIAGNOSIS — I10 Essential (primary) hypertension: Secondary | ICD-10-CM | POA: Diagnosis not present

## 2015-11-12 DIAGNOSIS — M1990 Unspecified osteoarthritis, unspecified site: Secondary | ICD-10-CM | POA: Diagnosis not present

## 2015-11-12 DIAGNOSIS — E119 Type 2 diabetes mellitus without complications: Secondary | ICD-10-CM | POA: Insufficient documentation

## 2015-11-12 DIAGNOSIS — Z79899 Other long term (current) drug therapy: Secondary | ICD-10-CM | POA: Insufficient documentation

## 2015-11-12 DIAGNOSIS — K219 Gastro-esophageal reflux disease without esophagitis: Secondary | ICD-10-CM | POA: Diagnosis not present

## 2015-11-12 LAB — HAPTOGLOBIN: Haptoglobin: 178 mg/dL (ref 34–200)

## 2015-11-12 MED ORDER — SODIUM CHLORIDE 0.9 % IV SOLN
Freq: Once | INTRAVENOUS | Status: AC
  Administered 2015-11-12: 12:00:00 via INTRAVENOUS
  Filled 2015-11-12: qty 1000

## 2015-11-12 MED ORDER — FERUMOXYTOL INJECTION 510 MG/17 ML
510.0000 mg | Freq: Once | INTRAVENOUS | Status: AC
  Administered 2015-11-12: 510 mg via INTRAVENOUS
  Filled 2015-11-12: qty 17

## 2015-11-12 NOTE — Progress Notes (Signed)
  Oncology Nurse Navigator Documentation  Navigator Location: [facility] (11/12/15 1000) Navigator Encounter Type: Follow-up Appt (11/12/15 1000)                                          Time Spent with Patient: 15 (11/12/15 1000)   Met with Veronica Stafford and her family. Introduced Therapist, nutritional and provided contact information for any future questions or needs.

## 2015-11-12 NOTE — Progress Notes (Signed)
Patient here today for follow up regarding anal cancer. Patients family is here to discuss treatment planning and CT results.

## 2015-11-14 ENCOUNTER — Ambulatory Visit: Payer: Medicare Other

## 2015-11-14 ENCOUNTER — Ambulatory Visit: Payer: Medicare Other | Admitting: Oncology

## 2015-11-14 DIAGNOSIS — C210 Malignant neoplasm of anus, unspecified: Secondary | ICD-10-CM | POA: Diagnosis not present

## 2015-11-15 ENCOUNTER — Other Ambulatory Visit: Payer: Self-pay | Admitting: *Deleted

## 2015-11-15 DIAGNOSIS — C210 Malignant neoplasm of anus, unspecified: Secondary | ICD-10-CM

## 2015-11-19 ENCOUNTER — Inpatient Hospital Stay: Payer: Medicare Other

## 2015-11-19 ENCOUNTER — Ambulatory Visit: Payer: Medicare Other

## 2015-11-19 ENCOUNTER — Ambulatory Visit
Admission: RE | Admit: 2015-11-19 | Discharge: 2015-11-19 | Disposition: A | Discharge status: Home / Self Care | Payer: Medicare Other | Source: Ambulatory Visit | Attending: Radiation Oncology | Admitting: Radiation Oncology

## 2015-11-19 VITALS — BP 122/76 | HR 71 | Temp 96.9°F | Resp 20

## 2015-11-19 DIAGNOSIS — D509 Iron deficiency anemia, unspecified: Secondary | ICD-10-CM

## 2015-11-19 DIAGNOSIS — C210 Malignant neoplasm of anus, unspecified: Secondary | ICD-10-CM | POA: Diagnosis not present

## 2015-11-19 MED ORDER — SODIUM CHLORIDE 0.9 % IV SOLN
Freq: Once | INTRAVENOUS | Status: AC
  Administered 2015-11-19: 11:00:00 via INTRAVENOUS
  Filled 2015-11-19: qty 1000

## 2015-11-19 MED ORDER — SODIUM CHLORIDE 0.9 % IV SOLN
510.0000 mg | Freq: Once | INTRAVENOUS | Status: AC
  Administered 2015-11-19: 510 mg via INTRAVENOUS
  Filled 2015-11-19: qty 17

## 2015-11-20 ENCOUNTER — Ambulatory Visit
Admission: RE | Admit: 2015-11-20 | Discharge: 2015-11-20 | Disposition: A | Discharge status: Home / Self Care | Payer: Medicare Other | Source: Ambulatory Visit | Attending: Radiation Oncology | Admitting: Radiation Oncology

## 2015-11-20 DIAGNOSIS — C210 Malignant neoplasm of anus, unspecified: Secondary | ICD-10-CM | POA: Diagnosis not present

## 2015-11-21 ENCOUNTER — Ambulatory Visit: Payer: Medicare Other

## 2015-11-21 DIAGNOSIS — C210 Malignant neoplasm of anus, unspecified: Secondary | ICD-10-CM | POA: Diagnosis not present

## 2015-11-22 ENCOUNTER — Ambulatory Visit: Payer: Medicare Other

## 2015-11-22 DIAGNOSIS — C210 Malignant neoplasm of anus, unspecified: Secondary | ICD-10-CM | POA: Diagnosis not present

## 2015-11-25 ENCOUNTER — Ambulatory Visit: Payer: Medicare Other

## 2015-11-25 DIAGNOSIS — C210 Malignant neoplasm of anus, unspecified: Secondary | ICD-10-CM | POA: Diagnosis not present

## 2015-11-26 ENCOUNTER — Ambulatory Visit: Payer: Medicare Other

## 2015-11-26 DIAGNOSIS — C210 Malignant neoplasm of anus, unspecified: Secondary | ICD-10-CM | POA: Diagnosis not present

## 2015-11-27 ENCOUNTER — Ambulatory Visit: Payer: Medicare Other

## 2015-11-27 DIAGNOSIS — C210 Malignant neoplasm of anus, unspecified: Secondary | ICD-10-CM | POA: Diagnosis not present

## 2015-11-28 ENCOUNTER — Inpatient Hospital Stay: Payer: Medicare Other

## 2015-11-28 ENCOUNTER — Ambulatory Visit: Payer: Medicare Other

## 2015-11-28 DIAGNOSIS — C210 Malignant neoplasm of anus, unspecified: Secondary | ICD-10-CM | POA: Diagnosis not present

## 2015-11-28 LAB — CBC
HEMATOCRIT: 31.8 % — AB (ref 35.0–47.0)
Hemoglobin: 10.3 g/dL — ABNORMAL LOW (ref 12.0–16.0)
MCH: 26.7 pg (ref 26.0–34.0)
MCHC: 32.4 g/dL (ref 32.0–36.0)
MCV: 82.3 fL (ref 80.0–100.0)
PLATELETS: 66 10*3/uL — AB (ref 150–440)
RBC: 3.86 MIL/uL (ref 3.80–5.20)
RDW: 20.9 % — AB (ref 11.5–14.5)
WBC: 4 10*3/uL (ref 3.6–11.0)

## 2015-11-29 ENCOUNTER — Ambulatory Visit: Payer: Medicare Other

## 2015-11-29 DIAGNOSIS — C210 Malignant neoplasm of anus, unspecified: Secondary | ICD-10-CM | POA: Diagnosis not present

## 2015-12-02 ENCOUNTER — Ambulatory Visit: Payer: Medicare Other

## 2015-12-02 DIAGNOSIS — C210 Malignant neoplasm of anus, unspecified: Secondary | ICD-10-CM | POA: Diagnosis not present

## 2015-12-03 ENCOUNTER — Telehealth: Payer: Self-pay | Admitting: *Deleted

## 2015-12-03 ENCOUNTER — Ambulatory Visit: Payer: Medicare Other

## 2015-12-03 DIAGNOSIS — C210 Malignant neoplasm of anus, unspecified: Secondary | ICD-10-CM | POA: Diagnosis not present

## 2015-12-03 NOTE — Telephone Encounter (Signed)
In reviewing patients chart it looks like patient is scheduled for radiation therapy at this time, FMLA papers for radiation therapy will have to go through radiation oncology. Additional FMLA papers can be filled at through medical oncology at completion of radiation if needed.

## 2015-12-03 NOTE — Telephone Encounter (Addendum)
Called to state she needs to paper work for her FMLA completed as she will be transporting her mother to appts. Please call

## 2015-12-03 NOTE — Telephone Encounter (Signed)
She has the forms and is going to fax it to Reception at [employer].

## 2015-12-04 ENCOUNTER — Emergency Department
Admission: EM | Admit: 2015-12-04 | Discharge: 2015-12-04 | Disposition: A | Discharge status: Home / Self Care | Payer: Medicare Other | Attending: Emergency Medicine | Admitting: Emergency Medicine

## 2015-12-04 ENCOUNTER — Encounter: Payer: Self-pay | Admitting: Emergency Medicine

## 2015-12-04 ENCOUNTER — Ambulatory Visit: Payer: Medicare Other

## 2015-12-04 DIAGNOSIS — E119 Type 2 diabetes mellitus without complications: Secondary | ICD-10-CM | POA: Insufficient documentation

## 2015-12-04 DIAGNOSIS — Z791 Long term (current) use of non-steroidal anti-inflammatories (NSAID): Secondary | ICD-10-CM | POA: Insufficient documentation

## 2015-12-04 DIAGNOSIS — Z792 Long term (current) use of antibiotics: Secondary | ICD-10-CM | POA: Insufficient documentation

## 2015-12-04 DIAGNOSIS — Z79899 Other long term (current) drug therapy: Secondary | ICD-10-CM | POA: Insufficient documentation

## 2015-12-04 DIAGNOSIS — R339 Retention of urine, unspecified: Secondary | ICD-10-CM | POA: Diagnosis not present

## 2015-12-04 DIAGNOSIS — Z5321 Procedure and treatment not carried out due to patient leaving prior to being seen by health care provider: Secondary | ICD-10-CM | POA: Insufficient documentation

## 2015-12-04 DIAGNOSIS — I10 Essential (primary) hypertension: Secondary | ICD-10-CM | POA: Insufficient documentation

## 2015-12-04 DIAGNOSIS — C210 Malignant neoplasm of anus, unspecified: Secondary | ICD-10-CM | POA: Diagnosis not present

## 2015-12-04 LAB — CBC WITH DIFFERENTIAL/PLATELET
BASOS PCT: 0 %
Basophils Absolute: 0 10*3/uL (ref 0–0.1)
Eosinophils Absolute: 0 10*3/uL (ref 0–0.7)
Eosinophils Relative: 0 %
HEMATOCRIT: 31.1 % — AB (ref 35.0–47.0)
HEMOGLOBIN: 10.3 g/dL — AB (ref 12.0–16.0)
Lymphocytes Relative: 9 %
Lymphs Abs: 0.3 10*3/uL — ABNORMAL LOW (ref 1.0–3.6)
MCH: 27.6 pg (ref 26.0–34.0)
MCHC: 33.2 g/dL (ref 32.0–36.0)
MCV: 83.1 fL (ref 80.0–100.0)
Monocytes Absolute: 0.3 10*3/uL (ref 0.2–0.9)
Monocytes Relative: 9 %
NEUTROS ABS: 3.1 10*3/uL (ref 1.4–6.5)
NEUTROS PCT: 82 %
Platelets: 61 10*3/uL — ABNORMAL LOW (ref 150–440)
RBC: 3.74 MIL/uL — AB (ref 3.80–5.20)
RDW: 21.4 % — ABNORMAL HIGH (ref 11.5–14.5)
WBC: 3.7 10*3/uL (ref 3.6–11.0)

## 2015-12-04 LAB — BASIC METABOLIC PANEL
Anion gap: 8 (ref 5–15)
BUN: 13 mg/dL (ref 6–20)
CO2: 28 mmol/L (ref 22–32)
Calcium: 8.4 mg/dL — ABNORMAL LOW (ref 8.9–10.3)
Chloride: 102 mmol/L (ref 101–111)
Creatinine, Ser: 1.25 mg/dL — ABNORMAL HIGH (ref 0.44–1.00)
GFR calc Af Amer: 44 mL/min — ABNORMAL LOW (ref >60)
GFR calc non Af Amer: 38 mL/min — ABNORMAL LOW (ref >60)
Glucose, Bld: 169 mg/dL — ABNORMAL HIGH (ref 65–99)
Potassium: 4.1 mmol/L (ref 3.5–5.1)
Sodium: 138 mmol/L (ref 135–145)

## 2015-12-04 NOTE — ED Triage Notes (Signed)
Pt from [facility]. Has not urinated since last night. Receiving radiation for rectal CA.

## 2015-12-04 NOTE — ED Triage Notes (Signed)
Reviewed pt with dr Mariea Clonts. Will wait for main side room. No bladder scan needed at this time.

## 2015-12-04 NOTE — ED Notes (Signed)
Lab results reviewed. Awaiting room.

## 2015-12-05 ENCOUNTER — Ambulatory Visit: Payer: Medicare Other

## 2015-12-05 ENCOUNTER — Inpatient Hospital Stay: Payer: Medicare Other

## 2015-12-05 ENCOUNTER — Telehealth: Payer: Self-pay | Admitting: Emergency Medicine

## 2015-12-05 DIAGNOSIS — C210 Malignant neoplasm of anus, unspecified: Secondary | ICD-10-CM

## 2015-12-05 LAB — CBC
HEMATOCRIT: 31.2 % — AB (ref 35.0–47.0)
Hemoglobin: 10.3 g/dL — ABNORMAL LOW (ref 12.0–16.0)
MCH: 27.3 pg (ref 26.0–34.0)
MCHC: 32.9 g/dL (ref 32.0–36.0)
MCV: 83.1 fL (ref 80.0–100.0)
Platelets: 65 10*3/uL — ABNORMAL LOW (ref 150–440)
RBC: 3.75 MIL/uL — ABNORMAL LOW (ref 3.80–5.20)
RDW: 21.2 % — AB (ref 11.5–14.5)
WBC: 4.1 10*3/uL (ref 3.6–11.0)

## 2015-12-05 NOTE — Telephone Encounter (Signed)
Called patient due to lwot to inquire about condition and follow up plans. Left message.   

## 2015-12-06 ENCOUNTER — Ambulatory Visit: Payer: Medicare Other

## 2015-12-06 DIAGNOSIS — C210 Malignant neoplasm of anus, unspecified: Secondary | ICD-10-CM | POA: Diagnosis not present

## 2015-12-09 ENCOUNTER — Ambulatory Visit: Payer: Medicare Other

## 2015-12-09 DIAGNOSIS — C210 Malignant neoplasm of anus, unspecified: Secondary | ICD-10-CM | POA: Diagnosis not present

## 2015-12-10 ENCOUNTER — Ambulatory Visit: Payer: Medicare Other

## 2015-12-10 DIAGNOSIS — C210 Malignant neoplasm of anus, unspecified: Secondary | ICD-10-CM | POA: Diagnosis not present

## 2015-12-11 ENCOUNTER — Ambulatory Visit: Payer: Medicare Other

## 2015-12-11 DIAGNOSIS — C210 Malignant neoplasm of anus, unspecified: Secondary | ICD-10-CM | POA: Diagnosis not present

## 2015-12-12 ENCOUNTER — Emergency Department
Admission: EM | Admit: 2015-12-12 | Discharge: 2015-12-13 | Disposition: A | Discharge status: Home / Self Care | Payer: Medicare Other | Attending: Emergency Medicine | Admitting: Emergency Medicine

## 2015-12-12 ENCOUNTER — Encounter: Payer: Self-pay | Admitting: Internal Medicine

## 2015-12-12 ENCOUNTER — Ambulatory Visit: Payer: Medicare Other

## 2015-12-12 ENCOUNTER — Ambulatory Visit: Admission: RE | Admit: 2015-12-12 | Payer: Medicare Other | Source: Ambulatory Visit

## 2015-12-12 ENCOUNTER — Inpatient Hospital Stay: Payer: Medicare Other | Attending: Oncology

## 2015-12-12 ENCOUNTER — Other Ambulatory Visit: Payer: Self-pay

## 2015-12-12 ENCOUNTER — Non-Acute Institutional Stay: Payer: Medicare Other | Admitting: Internal Medicine

## 2015-12-12 VITALS — BP 110/48 | HR 76 | Wt 190.0 lb

## 2015-12-12 DIAGNOSIS — F339 Major depressive disorder, recurrent, unspecified: Secondary | ICD-10-CM

## 2015-12-12 DIAGNOSIS — Z85048 Personal history of other malignant neoplasm of rectum, rectosigmoid junction, and anus: Secondary | ICD-10-CM | POA: Insufficient documentation

## 2015-12-12 DIAGNOSIS — R339 Retention of urine, unspecified: Secondary | ICD-10-CM | POA: Insufficient documentation

## 2015-12-12 DIAGNOSIS — K7581 Nonalcoholic steatohepatitis (NASH): Secondary | ICD-10-CM | POA: Insufficient documentation

## 2015-12-12 DIAGNOSIS — Z79899 Other long term (current) drug therapy: Secondary | ICD-10-CM | POA: Insufficient documentation

## 2015-12-12 DIAGNOSIS — M1990 Unspecified osteoarthritis, unspecified site: Secondary | ICD-10-CM | POA: Insufficient documentation

## 2015-12-12 DIAGNOSIS — M818 Other osteoporosis without current pathological fracture: Secondary | ICD-10-CM | POA: Insufficient documentation

## 2015-12-12 DIAGNOSIS — K219 Gastro-esophageal reflux disease without esophagitis: Secondary | ICD-10-CM | POA: Diagnosis not present

## 2015-12-12 DIAGNOSIS — C210 Malignant neoplasm of anus, unspecified: Secondary | ICD-10-CM

## 2015-12-12 DIAGNOSIS — I10 Essential (primary) hypertension: Secondary | ICD-10-CM | POA: Insufficient documentation

## 2015-12-12 DIAGNOSIS — E119 Type 2 diabetes mellitus without complications: Secondary | ICD-10-CM | POA: Insufficient documentation

## 2015-12-12 DIAGNOSIS — E1149 Type 2 diabetes mellitus with other diabetic neurological complication: Secondary | ICD-10-CM

## 2015-12-12 DIAGNOSIS — D696 Thrombocytopenia, unspecified: Secondary | ICD-10-CM | POA: Insufficient documentation

## 2015-12-12 DIAGNOSIS — F419 Anxiety disorder, unspecified: Secondary | ICD-10-CM | POA: Diagnosis not present

## 2015-12-12 DIAGNOSIS — D509 Iron deficiency anemia, unspecified: Secondary | ICD-10-CM | POA: Diagnosis not present

## 2015-12-12 LAB — CBC
HCT: 33.5 % — ABNORMAL LOW (ref 35.0–47.0)
HEMOGLOBIN: 10.9 g/dL — AB (ref 12.0–16.0)
MCH: 27.3 pg (ref 26.0–34.0)
MCHC: 32.6 g/dL (ref 32.0–36.0)
MCV: 83.6 fL (ref 80.0–100.0)
Platelets: 74 10*3/uL — ABNORMAL LOW (ref 150–440)
RBC: 4.01 MIL/uL (ref 3.80–5.20)
RDW: 20.2 % — AB (ref 11.5–14.5)
WBC: 4.4 10*3/uL (ref 3.6–11.0)

## 2015-12-12 NOTE — Assessment & Plan Note (Signed)
Has not been exacerbated by the cancer diagnosis Frustrated by the incontinence though

## 2015-12-12 NOTE — Assessment & Plan Note (Signed)
In RT course now Decided against chemo Will be reevaluated by Dr Grayland Ormond after the RT

## 2015-12-12 NOTE — ED Triage Notes (Signed)
Pt from [facility] via [facility], reports acute urinary retention since last night, pt has hx of rectal cancer and has had these complaints before due to radiation tx

## 2015-12-12 NOTE — Assessment & Plan Note (Signed)
Sugars have been okay Neuropathy pain not a big issue now

## 2015-12-12 NOTE — Progress Notes (Signed)
Subjective:    Patient ID: Veronica Stafford, female    DOB: 01/05/1929, 82 y.o.   MRN: [id number]  HPI Visit for follow up of chronic health issues Reviewed recent cancer diagnosis and now getting RT Reviewed status with Luellen Pucker RN  Anal cancer with possible enlarged node Decided against chemotherapy Getting RT now She notes concerns about bladder incontinence since starting RT. Wears protection Some burning and but no blood in urine Bowels are okay---no blood  Sugars have been okay No hypoglycemic reactions Occasional ankle swelling--mostly left Pain not a big issue  No problems with depression "Just aggravated with myself because I can't hold my water" No hallucinations  Still walks with rollator Assist with shower and dressing Does use bathroom on her own--but needs to call for help if incontinent  No chest pain No SOB  Current Outpatient Prescriptions on File Prior to Visit  Medication Sig Dispense Refill  . allopurinol (ZYLOPRIM) 300 MG tablet TAKE 1 TABLET BY MOUTH ONCE DAILY FOR GOUT 30 tablet 11  . aluminum hydroxide (DERMAGRAN) ointment Apply topically daily.    . bisacodyl (DULCOLAX) 5 MG EC tablet Take 5 mg by mouth every other day as needed for moderate constipation.    . clindamycin (CLEOCIN) 300 MG capsule Take 1 capsule (300 mg total) by mouth 3 (three) times daily. 15 capsule 0  . clonazePAM (KLONOPIN) 0.5 MG tablet Take 0.5 mg by mouth at bedtime. And bid prn    . cyanocobalamin 1000 MCG tablet Take 100 mcg by mouth daily.    Marland Kitchen desonide (DESOWEN) 0.05 % ointment Apply topically.    . ergocalciferol (VITAMIN D2) 50000 UNITS capsule Take 50,000 Units by mouth every 30 (thirty) days.      Marland Kitchen FLUoxetine (PROZAC) 40 MG capsule Take 1 capsule (40 mg total) by mouth daily. 30 capsule 11  . furosemide (LASIX) 80 MG tablet Take by mouth.    . gabapentin (NEURONTIN) 300 MG capsule Take 2 capsules (600 mg total) by mouth 3 (three) times daily. Dx: E11.42 180 capsule 11    . HYDROcodone-acetaminophen (NORCO/VICODIN) 5-325 MG tablet Take 1 tablet by mouth every 6 (six) hours as needed for moderate pain. 30 tablet 0  . hydrocortisone 2.5 % cream Apply topically.    . lidocaine (LIDODERM) 5 % Place onto the skin.    Marland Kitchen lidocaine (XYLOCAINE) 2 % jelly Apply topically.    . meclizine (ANTIVERT) 25 MG tablet Take 25 mg by mouth 3 (three) times daily as needed for dizziness. Reported on 08/21/2015    . Multiple Vitamins-Minerals (MULTIVITAMIN WITH MINERALS) tablet Take 1 tablet by mouth daily.     . mupirocin ointment (BACTROBAN) 2 % Place 1 application into the nose 2 (two) times daily. Reported on 08/21/2015    . NITRO-BID 2 % ointment   0  . omeprazole (PRILOSEC) 20 MG capsule TAKE ONE CAPSULE BY MOUTH TWICE A DAY. *DO NOT CRUSH* 60 capsule 11  . polyethylene glycol (MIRALAX / GLYCOLAX) packet Take by mouth.    . prochlorperazine (COMPAZINE) 10 MG tablet Take 1 tablet (10 mg total) by mouth every 6 (six) hours as needed. Take 30 min prior to taking pain medication to prevent nausea, vomiting. 30 tablet 3  . risperiDONE (RISPERDAL) 1 MG tablet Take 2 tablets (2 mg total) by mouth at bedtime. 30 tablet 11  . sennosides-docusate sodium (SENOKOT-S) 8.6-50 MG tablet Take 1 tablet by mouth 2 (two) times daily.    . traMADol (ULTRAM) 50 MG  tablet Take 100 mg by mouth 3 (three) times daily.    Marland Kitchen zolpidem (AMBIEN) 10 MG tablet Take 10 mg by mouth at bedtime as needed for sleep.     No current facility-administered medications on file prior to visit.     Allergies  Allergen Reactions  . Cimetidine     Other reaction(s): Unknown  . Ciprofloxacin     Other reaction(s): Unknown  . Metoclopramide     Other reaction(s): Other (See Comments) SEVERE DEPRESSION  . Propoxyphene     Other reaction(s): Unknown  . Codeine   . Demerol   . Erythromycin   . Fentanyl   . Flagyl [Metronidazole Hcl]   . Latex Rash  . Morphine And Related     Past Medical History:  Diagnosis  Date  . Anxiety   . Arthritis   . Cancer [facility])    Anal cancer  . Depression   . Diabetes mellitus   . GERD (gastroesophageal reflux disease)   . Hypertension   . Lumbar disc disease   . NASH (nonalcoholic steatohepatitis)   . Osteoporosis   . Pancytopenia    Dr Danie Chandler infusions  . Psychosis    paranoid hallucinations  . Vitamin B12 deficiency   . Wrist fracture, left 9/12    Past Surgical History:  Procedure Laterality Date  . ABDOMINAL HYSTERECTOMY    . BREAST SURGERY     breast reduction  . CATARACT EXTRACTION W/ INTRAOCULAR LENS  IMPLANT, BILATERAL    . CHOLECYSTECTOMY    . ELBOW SURGERY     4 on each  . FIXATION KYPHOPLASTY  1/12  . HEMORRHOID SURGERY    . JOINT REPLACEMENT     TKR bilateral  . MANDIBLE SURGERY     twice  . Revision of TKR  9/13   Cartilage replaced  . Right ear lesion removal      Family History  Problem Relation Age of Onset  . Cancer Mother     colon  . Heart disease Father     Social History   Social History  . Marital status: Widowed    Spouse name: N/A  . Number of children: N/A  . Years of education: N/A   Occupational History  . Nurse, retired     retired   Social History Main Topics  . Smoking status: Never Smoker  . Smokeless tobacco: Never Used  . Alcohol use No  . Drug use: No  . Sexual activity: Not on file   Other Topics Concern  . Not on file   Social History Narrative   1 son nearby--he is to make medical decisions   2 daughters   Has DNR   Would not accept tube feeds   Review of Systems Nocturia x 1 Appetite still okay Weight stable Sleeps fairly well Occasional vague abdominal discomfort still    Objective:   Physical Exam  Constitutional: She appears well-developed and well-nourished. No distress.  Neck: No thyromegaly present.  Cardiovascular: Normal rate, regular rhythm, normal heart sounds and intact distal pulses.  Exam reveals no gallop.   No murmur heard. Pulmonary/Chest: Effort  normal and breath sounds normal. No respiratory distress. She has no wheezes. She has no rales.  Abdominal: Soft. She exhibits no distension. There is no tenderness. There is no rebound and no guarding.  Musculoskeletal: She exhibits no edema.  Lymphadenopathy:    She has no cervical adenopathy.  Skin:  No foot lesions  Psychiatric: She has a normal  mood and affect. Her behavior is normal.          Assessment & Plan:

## 2015-12-12 NOTE — ED Provider Notes (Signed)
[facility] Emergency Department Provider Note   ____________________________________________   First MD Initiated Contact with Patient 12/12/15 2312     (approximate)  I have reviewed the triage vital signs and the nursing notes.   HISTORY  Chief Complaint Urinary Retention    HPI Veronica Stafford is a 82 y.o. female who presents to the ED from nursing home via EMS with a chief complaint of urinary retention. Patient has a history of rectal cancer, taking radiation who states she has had similar symptoms previously secondary to radiation treatments. Reports she last urinated at lunch yesterday. Denies associated fever, chills, chest pain, shortness of breath, nausea, vomiting, constipation, diarrhea. Reports suprapubic discomfort.Denies recent travel or trauma. Nothing makes her symptoms better or worse.   Past Medical History:  Diagnosis Date  . Anxiety   . Arthritis   . Cancer [facility])    Anal cancer  . Depression   . Diabetes mellitus   . GERD (gastroesophageal reflux disease)   . Hypertension   . Lumbar disc disease   . NASH (nonalcoholic steatohepatitis)   . Osteoporosis   . Pancytopenia    Dr Danie Chandler infusions  . Psychosis    paranoid hallucinations  . Vitamin B12 deficiency   . Wrist fracture, left 9/12    Patient Active Problem List   Diagnosis Date Noted  . Iron deficiency anemia 11/07/2015  . Anal cancer ([place]) 11/04/2015  . Anal fissure 09/26/2015  . Sebaceous cyst 06/27/2015  . Dermatophytosis of nail 06/28/2014  . Diabetes, polyneuropathy ([place]) 03/23/2013  . Ventral hernia 09/16/2012  . Osteoarthritis, knee 11/12/2011  . Lumbar disc disease with radiculopathy 10/23/2010  . Chronic recurrent major depressive disorder ([place])   . Thrombocytopenia ([place])   . Anxiety   . Hypertension   . GERD (gastroesophageal reflux disease)   . Type 2 diabetes mellitus with neurological manifestations, controlled ([place])   . Osteoporosis,  unspecified   . Psychosis   . Vitamin B12 deficiency   . NASH (nonalcoholic steatohepatitis)     Past Surgical History:  Procedure Laterality Date  . ABDOMINAL HYSTERECTOMY    . BREAST SURGERY     breast reduction  . CATARACT EXTRACTION W/ INTRAOCULAR LENS  IMPLANT, BILATERAL    . CHOLECYSTECTOMY    . ELBOW SURGERY     4 on each  . FIXATION KYPHOPLASTY  1/12  . HEMORRHOID SURGERY    . JOINT REPLACEMENT     TKR bilateral  . MANDIBLE SURGERY     twice  . Revision of TKR  9/13   Cartilage replaced  . Right ear lesion removal      Prior to Admission medications   Medication Sig Start Date End Date Taking? Authorizing Provider  allopurinol (ZYLOPRIM) 300 MG tablet TAKE 1 TABLET BY MOUTH ONCE DAILY FOR GOUT 01/08/14  Yes Venia Carbon, MD  aluminum hydroxide [facility]) ointment Apply 1 application topically daily. Apply to bottom   Yes Historical Provider, MD  bisacodyl (DULCOLAX) 5 MG EC tablet Take 5 mg by mouth every other day as needed for moderate constipation.   Yes Historical Provider, MD  clonazePAM (KLONOPIN) 0.5 MG tablet Take 0.5 mg by mouth at bedtime. And bid prn   Yes Historical Provider, MD  cyanocobalamin 1000 MCG tablet Take 1,000 mcg by mouth daily.    Yes Historical Provider, MD  desonide (DESOWEN) 0.05 % ointment Apply 1 application topically 3 (three) times daily. To lips and corners of mouth   Yes  Historical Provider, MD  ergocalciferol (VITAMIN D2) 50000 UNITS capsule Take 50,000 Units by mouth every 30 (thirty) days.     Yes Historical Provider, MD  FLUoxetine (PROZAC) 40 MG capsule Take 1 capsule (40 mg total) by mouth daily. 11/12/10  Yes Venia Carbon, MD  furosemide (LASIX) 80 MG tablet Take 80 mg by mouth daily.    Yes Historical Provider, MD  gabapentin (NEURONTIN) 300 MG capsule Take 2 capsules (600 mg total) by mouth 3 (three) times daily. Dx: E11.42 03/07/15  Yes Venia Carbon, MD  HYDROcodone-acetaminophen (NORCO/VICODIN) 5-325 MG tablet Take 1  tablet by mouth every 6 (six) hours as needed for moderate pain. 11/04/15  Yes Lloyd Huger, MD  lidocaine (LIDODERM) 5 % Place 1 patch onto the skin daily. On 12 hours off 12 hours   Yes Historical Provider, MD  lidocaine (XYLOCAINE) 2 % jelly Apply 1 application topically 3 (three) times daily. Apply to perianal area   Yes Historical Provider, MD  meclizine (ANTIVERT) 25 MG tablet Take 25 mg by mouth 3 (three) times daily as needed for dizziness. Reported on 08/21/2015   Yes Historical Provider, MD  Multiple Vitamins-Minerals (MULTIVITAMIN WITH MINERALS) tablet Take 1 tablet by mouth daily.    Yes Historical Provider, MD  omeprazole (PRILOSEC) 40 MG capsule Take 20 mg by mouth 2 (two) times daily.   Yes Historical Provider, MD  Polyethyl Glycol-Propyl Glycol (SYSTANE) 0.4-0.3 % SOLN Place 1 drop into both eyes 4 (four) times daily.   Yes Historical Provider, MD  prochlorperazine (COMPAZINE) 10 MG tablet Take 1 tablet (10 mg total) by mouth every 6 (six) hours as needed. Take 30 min prior to taking pain medication to prevent nausea, vomiting. 11/04/15  Yes Lloyd Huger, MD  risperiDONE (RISPERDAL) 1 MG tablet Take 2 tablets (2 mg total) by mouth at bedtime. 09/16/10  Yes Venia Carbon, MD  sennosides-docusate sodium (SENOKOT-S) 8.6-50 MG tablet Take 1 tablet by mouth 2 (two) times daily.   Yes Historical Provider, MD  traMADol (ULTRAM) 50 MG tablet Take 100 mg by mouth 3 (three) times daily.   Yes Historical Provider, MD  zolpidem (AMBIEN) 10 MG tablet Take 10 mg by mouth at bedtime as needed for sleep.   Yes Historical Provider, MD  clindamycin (CLEOCIN) 300 MG capsule Take 1 capsule (300 mg total) by mouth 3 (three) times daily. Patient not taking: Reported on 12/12/2015 04/28/15   Epifanio Lesches, MD    Allergies Cimetidine; Ciprofloxacin; Metoclopramide; Propoxyphene; Codeine; Demerol; Erythromycin; Fentanyl; Flagyl [metronidazole hcl]; Latex; and Morphine and related  Family  History  Problem Relation Age of Onset  . Cancer Mother     colon  . Heart disease Father     Social History Social History  Substance Use Topics  . Smoking status: Never Smoker  . Smokeless tobacco: Never Used  . Alcohol use No    Review of Systems  Constitutional: No fever/chills. Eyes: No visual changes. ENT: No sore throat. Cardiovascular: Denies chest pain. Respiratory: Denies shortness of breath. Gastrointestinal: No abdominal pain.  No nausea, no vomiting.  No diarrhea.  No constipation. Genitourinary: Positive for urinary retention. Negative for dysuria. Musculoskeletal: Negative for back pain. Skin: Negative for rash. Neurological: Negative for headaches, focal weakness or numbness.  10-point ROS otherwise negative.  ____________________________________________   PHYSICAL EXAM:  VITAL SIGNS: ED Triage Vitals [12/12/15 2246]  Enc Vitals Group     BP 126/64     Pulse Rate 73  Resp 16     Temp 98.4 F (36.9 C)     Temp src      SpO2 99 %     Weight 190 lb (86.2 kg)     Height 5\' 8"  (1.727 m)     Head Circumference      Peak Flow      Pain Score      Pain Loc      Pain Edu?      Excl. in [place]?     Constitutional: Alert and oriented. Well appearing and in no acute distress. Eyes: Conjunctivae are normal. PERRL. EOMI. Head: Atraumatic. Nose: No congestion/rhinnorhea. Mouth/Throat: Mucous membranes are moist.  Oropharynx non-erythematous. Neck: No stridor.   Cardiovascular: Normal rate, regular rhythm. Grossly normal heart sounds.  Good peripheral circulation. Respiratory: Normal respiratory effort.  No retractions. Lungs CTAB. Gastrointestinal: Soft and nontender to light or deep palpation. No distention. No abdominal bruits. No CVA tenderness. Musculoskeletal: No lower extremity tenderness nor edema.  No joint effusions. Neurologic:  Normal speech and language. No gross focal neurologic deficits are appreciated.  Skin:  Skin is warm, dry and  intact. No rash noted. Psychiatric: Mood and affect are normal. Speech and behavior are normal.  ____________________________________________   LABS (all labs ordered are listed, but only abnormal results are displayed)  Labs Reviewed  BASIC METABOLIC PANEL - Abnormal; Notable for the following:       Result Value   Glucose, Bld 153 (*)    Calcium 8.5 (*)    GFR calc non Af Amer 52 (*)    All other components within normal limits  URINALYSIS COMPLETEWITH MICROSCOPIC ([facility] ONLY) - Abnormal; Notable for the following:    Color, Urine YELLOW (*)    APPearance CLEAR (*)    All other components within normal limits  CBC WITH DIFFERENTIAL/PLATELET - Abnormal; Notable for the following:    RBC 3.71 (*)    Hemoglobin 10.2 (*)    HCT 31.2 (*)    RDW 20.9 (*)    Platelets 98 (*)    Lymphs Abs 0.5 (*)    All other components within normal limits  TROPONIN I  CBC WITH DIFFERENTIAL/PLATELET   ____________________________________________  EKG  None ____________________________________________  RADIOLOGY  Portable chest x-ray (viewed by me, interpreted per Dr. Dorann Lodge): Mild chronic bronchitic changes. ____________________________________________   PROCEDURES  Procedure(s) performed: None  Procedures  Critical Care performed: No  ____________________________________________   INITIAL IMPRESSION / ASSESSMENT AND PLAN / ED COURSE  Pertinent labs & imaging results that were available during my care of the patient were reviewed by me and considered in my medical decision making (see chart for details).  82 year old female who presents with urinary retention; history of rectal cancer status post radiation treatments. Will obtain screening lab work, bladder scan, urinalysis, and reassess.  Clinical Course  Comment By Time  Updated patient of laboratory, urinalysis and imaging results. Room air saturation 95%. Discussed with patient who agrees with leaving Foley in place.  She will follow-up with urology next week. There are some WBC sediment in the Foley tube. Will place on empiric antibiotics. Strict return precautions given. Patient verbalizes understanding and agrees with plan of care. Paulette Blanch, MD 10/06 0255     ____________________________________________   FINAL CLINICAL IMPRESSION(S) / ED DIAGNOSES  Final diagnoses:  Urinary retention      NEW MEDICATIONS STARTED DURING THIS VISIT:  New Prescriptions   No medications on file     Note:  This document was prepared using Dragon voice recognition software and may include unintentional dictation errors.    Paulette Blanch, MD 12/13/15 [phone]

## 2015-12-12 NOTE — Assessment & Plan Note (Signed)
BP Readings from Last 3 Encounters:  12/12/15 (!) 110/48  12/04/15 (!) 121/52  11/19/15 122/76   Good control No changes for now

## 2015-12-12 NOTE — Assessment & Plan Note (Signed)
Has had ferraheme and close heme follow up

## 2015-12-13 ENCOUNTER — Emergency Department: Payer: Medicare Other

## 2015-12-13 ENCOUNTER — Ambulatory Visit: Payer: Medicare Other

## 2015-12-13 DIAGNOSIS — C210 Malignant neoplasm of anus, unspecified: Secondary | ICD-10-CM | POA: Diagnosis not present

## 2015-12-13 LAB — BASIC METABOLIC PANEL
ANION GAP: 6 (ref 5–15)
BUN: 13 mg/dL (ref 6–20)
CALCIUM: 8.5 mg/dL — AB (ref 8.9–10.3)
CO2: 31 mmol/L (ref 22–32)
Chloride: 102 mmol/L (ref 101–111)
Creatinine, Ser: 0.97 mg/dL (ref 0.44–1.00)
GFR calc Af Amer: >60 mL/min (ref >60)
GFR, EST NON AFRICAN AMERICAN: 52 mL/min — AB (ref >60)
GLUCOSE: 153 mg/dL — AB (ref 65–99)
Potassium: 3.7 mmol/L (ref 3.5–5.1)
Sodium: 139 mmol/L (ref 135–145)

## 2015-12-13 LAB — URINALYSIS COMPLETE WITH MICROSCOPIC (ARMC ONLY)
BACTERIA UA: NONE SEEN
Bilirubin Urine: NEGATIVE
GLUCOSE, UA: NEGATIVE mg/dL
HGB URINE DIPSTICK: NEGATIVE
Ketones, ur: NEGATIVE mg/dL
LEUKOCYTES UA: NEGATIVE
Nitrite: NEGATIVE
PROTEIN: NEGATIVE mg/dL
SPECIFIC GRAVITY, URINE: 1.015 (ref 1.005–1.030)
SQUAMOUS EPITHELIAL / LPF: NONE SEEN
pH: 5 (ref 5.0–8.0)

## 2015-12-13 LAB — TROPONIN I

## 2015-12-13 LAB — CBC WITH DIFFERENTIAL/PLATELET
BASOS ABS: 0 10*3/uL (ref 0–0.1)
BASOS PCT: 1 %
EOS ABS: 0.1 10*3/uL (ref 0–0.7)
Eosinophils Relative: 2 %
HEMATOCRIT: 31.2 % — AB (ref 35.0–47.0)
HEMOGLOBIN: 10.2 g/dL — AB (ref 12.0–16.0)
Lymphocytes Relative: 12 %
Lymphs Abs: 0.5 10*3/uL — ABNORMAL LOW (ref 1.0–3.6)
MCH: 27.6 pg (ref 26.0–34.0)
MCHC: 32.8 g/dL (ref 32.0–36.0)
MCV: 84 fL (ref 80.0–100.0)
MONOS PCT: 10 %
Monocytes Absolute: 0.4 10*3/uL (ref 0.2–0.9)
Neutro Abs: 3.1 10*3/uL (ref 1.4–6.5)
Neutrophils Relative %: 75 %
Platelets: 98 10*3/uL — ABNORMAL LOW (ref 150–440)
RBC: 3.71 MIL/uL — AB (ref 3.80–5.20)
RDW: 20.9 % — ABNORMAL HIGH (ref 11.5–14.5)
WBC: 4 10*3/uL (ref 3.6–11.0)

## 2015-12-13 MED ORDER — CEPHALEXIN 500 MG PO CAPS
500.0000 mg | ORAL_CAPSULE | Freq: Once | ORAL | Status: AC
  Administered 2015-12-13: 500 mg via ORAL
  Filled 2015-12-13: qty 1

## 2015-12-13 MED ORDER — CEPHALEXIN 500 MG PO CAPS: 500.0000 mg | ORAL_CAPSULE | Freq: Three times a day (TID) | ORAL | 0 refills | Status: DC

## 2015-12-13 NOTE — ED Notes (Signed)
Brought pt po fluids per MD request.

## 2015-12-13 NOTE — Discharge Instructions (Signed)
1. Take antibiotic as prescribed (Keflex 500 mg 3 times daily 7 days). 2. Keep Foley catheter in place until seen by the urologist. 3. Return to the ER for worsening symptoms, persistent vomiting, fever, difficulty breathing or other concerns.

## 2015-12-13 NOTE — ED Notes (Addendum)
Pt c/o urinary retention since 1500 wednesday

## 2015-12-13 NOTE — ED Notes (Signed)
Reviewed d/c instructions, follow-up care and prescription with pt. Pt verbalized understanding

## 2015-12-13 NOTE — ED Notes (Signed)
Attempted to call report to [facility] without success. Reviewed d/c instructions with pt. Pt verbalized understanding. Placed not on d/c instructions for [facility] RN to call ED with any questions. +

## 2015-12-16 ENCOUNTER — Ambulatory Visit: Payer: Medicare Other

## 2015-12-16 DIAGNOSIS — C210 Malignant neoplasm of anus, unspecified: Secondary | ICD-10-CM | POA: Diagnosis not present

## 2015-12-17 ENCOUNTER — Ambulatory Visit: Payer: Medicare Other

## 2015-12-17 DIAGNOSIS — C210 Malignant neoplasm of anus, unspecified: Secondary | ICD-10-CM | POA: Diagnosis not present

## 2015-12-18 ENCOUNTER — Ambulatory Visit: Payer: Medicare Other

## 2015-12-18 DIAGNOSIS — C210 Malignant neoplasm of anus, unspecified: Secondary | ICD-10-CM | POA: Diagnosis not present

## 2015-12-19 ENCOUNTER — Ambulatory Visit: Payer: Medicare Other

## 2015-12-19 ENCOUNTER — Inpatient Hospital Stay: Payer: Medicare Other

## 2015-12-19 DIAGNOSIS — C210 Malignant neoplasm of anus, unspecified: Secondary | ICD-10-CM | POA: Diagnosis not present

## 2015-12-20 ENCOUNTER — Ambulatory Visit: Payer: Medicare Other

## 2015-12-20 DIAGNOSIS — C210 Malignant neoplasm of anus, unspecified: Secondary | ICD-10-CM | POA: Diagnosis not present

## 2015-12-23 ENCOUNTER — Ambulatory Visit: Payer: Medicare Other

## 2015-12-23 DIAGNOSIS — C210 Malignant neoplasm of anus, unspecified: Secondary | ICD-10-CM | POA: Diagnosis not present

## 2015-12-24 ENCOUNTER — Ambulatory Visit: Payer: Medicare Other

## 2015-12-24 DIAGNOSIS — C210 Malignant neoplasm of anus, unspecified: Secondary | ICD-10-CM | POA: Diagnosis not present

## 2015-12-25 ENCOUNTER — Ambulatory Visit: Payer: Medicare Other

## 2015-12-25 ENCOUNTER — Inpatient Hospital Stay (HOSPITAL_BASED_OUTPATIENT_CLINIC_OR_DEPARTMENT_OTHER): Payer: Medicare Other | Admitting: Oncology

## 2015-12-25 ENCOUNTER — Other Ambulatory Visit: Payer: Self-pay | Admitting: *Deleted

## 2015-12-25 ENCOUNTER — Inpatient Hospital Stay: Payer: Medicare Other

## 2015-12-25 VITALS — BP 106/68 | HR 73 | Temp 96.6°F | Resp 18

## 2015-12-25 DIAGNOSIS — D696 Thrombocytopenia, unspecified: Secondary | ICD-10-CM

## 2015-12-25 DIAGNOSIS — C210 Malignant neoplasm of anus, unspecified: Secondary | ICD-10-CM | POA: Diagnosis not present

## 2015-12-25 DIAGNOSIS — R339 Retention of urine, unspecified: Secondary | ICD-10-CM

## 2015-12-25 DIAGNOSIS — D500 Iron deficiency anemia secondary to blood loss (chronic): Secondary | ICD-10-CM | POA: Diagnosis not present

## 2015-12-25 DIAGNOSIS — Z79899 Other long term (current) drug therapy: Secondary | ICD-10-CM

## 2015-12-25 LAB — COMPREHENSIVE METABOLIC PANEL
ALBUMIN: 3.1 g/dL — AB (ref 3.5–5.0)
ALK PHOS: 105 U/L (ref 38–126)
ALT: 13 U/L — AB (ref 14–54)
ANION GAP: 9 (ref 5–15)
AST: 25 U/L (ref 15–41)
BILIRUBIN TOTAL: 0.4 mg/dL (ref 0.3–1.2)
BUN: 15 mg/dL (ref 6–20)
CALCIUM: 8.6 mg/dL — AB (ref 8.9–10.3)
CO2: 29 mmol/L (ref 22–32)
Chloride: 99 mmol/L — ABNORMAL LOW (ref 101–111)
Creatinine, Ser: 1.15 mg/dL — ABNORMAL HIGH (ref 0.44–1.00)
GFR calc Af Amer: 49 mL/min — ABNORMAL LOW (ref >60)
GFR calc non Af Amer: 42 mL/min — ABNORMAL LOW (ref >60)
GLUCOSE: 175 mg/dL — AB (ref 65–99)
Potassium: 4 mmol/L (ref 3.5–5.1)
SODIUM: 137 mmol/L (ref 135–145)
TOTAL PROTEIN: 6.5 g/dL (ref 6.5–8.1)

## 2015-12-25 LAB — CBC WITH DIFFERENTIAL/PLATELET
BASOS ABS: 0 10*3/uL (ref 0–0.1)
BASOS PCT: 0 %
EOS ABS: 0 10*3/uL (ref 0–0.7)
Eosinophils Relative: 0 %
HEMATOCRIT: 32.5 % — AB (ref 35.0–47.0)
HEMOGLOBIN: 10.6 g/dL — AB (ref 12.0–16.0)
Lymphocytes Relative: 6 %
Lymphs Abs: 0.4 10*3/uL — ABNORMAL LOW (ref 1.0–3.6)
MCH: 27.3 pg (ref 26.0–34.0)
MCHC: 32.5 g/dL (ref 32.0–36.0)
MCV: 83.9 fL (ref 80.0–100.0)
MONOS PCT: 6 %
Monocytes Absolute: 0.4 10*3/uL (ref 0.2–0.9)
NEUTROS ABS: 5.9 10*3/uL (ref 1.4–6.5)
NEUTROS PCT: 88 %
Platelets: 86 10*3/uL — ABNORMAL LOW (ref 150–440)
RBC: 3.87 MIL/uL (ref 3.80–5.20)
RDW: 20 % — ABNORMAL HIGH (ref 11.5–14.5)
WBC: 6.8 10*3/uL (ref 3.6–11.0)

## 2015-12-25 NOTE — Progress Notes (Signed)
[place]  [phone] [phone]  ID: JHOSELYN RUFFINI OB: 1929-01-01  MR#: [id number]  EXN#:[id number]  Patient Care Team: Venia Carbon, MD as PCP - General  CHIEF COMPLAINT: Anal cancer, thrombocytopenia, iron deficiency anemia.  INTERVAL HISTORY: Patient returns to clinic today for repeat laboratory work and further evaluation. She is having significant problems with urinary retention. He continues to have pain, but this is improved. She otherwise feels well. She has no neurologic complaints. She denies any bleeding or easy bruising. She has a good appetite and denies weight loss. She denies any recent fevers or illnesses. She has no chest pain or shortness of breath. She denies any nausea, vomiting, constipation, or diarrhea. She has no urinary complaints. Patient offers no further specific complaints.  REVIEW OF SYSTEMS:   Review of Systems  Constitutional: Negative for fever, malaise/fatigue and weight loss.  Respiratory: Negative.  Negative for cough and shortness of breath.   Cardiovascular: Negative.  Negative for chest pain and leg swelling.  Gastrointestinal: Positive for abdominal pain. Negative for blood in stool, constipation, diarrhea, melena, nausea and vomiting.  Genitourinary: Positive for dysuria and frequency.       Urinary retention.  Musculoskeletal: Negative.   Neurological: Negative.  Negative for weakness.  Psychiatric/Behavioral: The patient is nervous/anxious.     As per HPI. Otherwise, a complete review of systems is negative.  PAST MEDICAL HISTORY: Past Medical History:  Diagnosis Date  . Anxiety   . Arthritis   . Cancer [facility])    Anal cancer  . Depression   . Diabetes mellitus   . GERD (gastroesophageal reflux disease)   . Hypertension   . Lumbar disc disease   . NASH (nonalcoholic steatohepatitis)   . Osteoporosis   . Pancytopenia    Dr Danie Chandler infusions  . Psychosis    paranoid hallucinations    . Vitamin B12 deficiency   . Wrist fracture, left 9/12    PAST SURGICAL HISTORY: Past Surgical History:  Procedure Laterality Date  . ABDOMINAL HYSTERECTOMY    . BREAST SURGERY     breast reduction  . CATARACT EXTRACTION W/ INTRAOCULAR LENS  IMPLANT, BILATERAL    . CHOLECYSTECTOMY    . ELBOW SURGERY     4 on each  . FIXATION KYPHOPLASTY  1/12  . HEMORRHOID SURGERY    . JOINT REPLACEMENT     TKR bilateral  . MANDIBLE SURGERY     twice  . Revision of TKR  9/13   Cartilage replaced  . Right ear lesion removal      FAMILY HISTORY: Family History  Problem Relation Age of Onset  . Cancer Mother     colon  . Heart disease Father        ADVANCED DIRECTIVES (Y/N):  N   HEALTH MAINTENANCE: Social History  Substance Use Topics  . Smoking status: Never Smoker  . Smokeless tobacco: Never Used  . Alcohol use No     Colonoscopy:  PAP:  Bone density:  Lipid panel:  Allergies  Allergen Reactions  . Cimetidine     Other reaction(s): Unknown  . Ciprofloxacin     Other reaction(s): Unknown  . Metoclopramide     Other reaction(s): Other (See Comments) SEVERE DEPRESSION  . Propoxyphene     Other reaction(s): Unknown  . Codeine   . Demerol   . Erythromycin   . Fentanyl   . Flagyl [Metronidazole Hcl]   . Latex Rash  . Morphine And Related  Current Outpatient Prescriptions  Medication Sig Dispense Refill  . acetaminophen (TYLENOL) 325 MG tablet Take 650 mg by mouth every 6 (six) hours as needed.    Marland Kitchen allopurinol (ZYLOPRIM) 300 MG tablet TAKE 1 TABLET BY MOUTH ONCE DAILY FOR GOUT 30 tablet 11  . aluminum hydroxide (DERMAGRAN) ointment Apply 1 application topically daily. Apply to bottom    . bisacodyl (DULCOLAX) 5 MG EC tablet Take 5 mg by mouth every other day as needed for moderate constipation.    . clonazePAM (KLONOPIN) 0.5 MG tablet Take 0.5 mg by mouth at bedtime. And bid prn    . clonazePAM (KLONOPIN) 0.5 MG tablet Take 0.5 mg by mouth 2 (two) times  daily as needed for anxiety.    . cyanocobalamin 1000 MCG tablet Take 1,000 mcg by mouth daily.     Marland Kitchen desonide (DESOWEN) 0.05 % ointment Apply 1 application topically 3 (three) times daily. To lips and corners of mouth    . Ergocalciferol (VITAMIN D2 PO) Take 1.25 mg by mouth every 30 (thirty) days.    Marland Kitchen FLUoxetine (PROZAC) 40 MG capsule Take 1 capsule (40 mg total) by mouth daily. 30 capsule 11  . furosemide (LASIX) 80 MG tablet Take 80 mg by mouth daily.     Marland Kitchen gabapentin (NEURONTIN) 300 MG capsule Take 2 capsules (600 mg total) by mouth 3 (three) times daily. Dx: E11.42 180 capsule 11  . HYDROcodone-acetaminophen (NORCO/VICODIN) 5-325 MG tablet Take 1 tablet by mouth every 6 (six) hours as needed for moderate pain. 30 tablet 0  . Lidocaine (ASPERCREME LIDOCAINE) 4 % PTCH Apply 1 patch topically daily. Apply to lower back for 12 hours then 12 hours off.    . lidocaine (XYLOCAINE) 2 % jelly Apply 1 application topically 3 (three) times daily. Apply to perianal area    . meclizine (ANTIVERT) 25 MG tablet Take 25 mg by mouth 3 (three) times daily as needed for dizziness. Reported on 08/21/2015    . Multiple Vitamins-Minerals (MULTIVITAMIN WITH MINERALS) tablet Take 1 tablet by mouth daily.     Marland Kitchen nystatin cream (MYCOSTATIN) Apply 1 application topically 2 (two) times daily.    Marland Kitchen omeprazole (PRILOSEC) 40 MG capsule Take 20 mg by mouth 2 (two) times daily.    Vladimir Faster Glycol-Propyl Glycol (SYSTANE) 0.4-0.3 % SOLN Place 1 drop into both eyes 4 (four) times daily.    . prochlorperazine (COMPAZINE) 10 MG tablet Take 1 tablet (10 mg total) by mouth every 6 (six) hours as needed. Take 30 min prior to taking pain medication to prevent nausea, vomiting. 30 tablet 3  . risperiDONE (RISPERDAL) 1 MG tablet Take 2 tablets (2 mg total) by mouth at bedtime. 30 tablet 11  . sennosides-docusate sodium (SENOKOT-S) 8.6-50 MG tablet Take 1 tablet by mouth 2 (two) times daily.    . traMADol (ULTRAM) 50 MG tablet Take  100 mg by mouth 3 (three) times daily.    Marland Kitchen zolpidem (AMBIEN) 10 MG tablet Take 10 mg by mouth at bedtime as needed for sleep.     No current facility-administered medications for this visit.     OBJECTIVE: Vitals:   12/25/15 1513  BP: 106/68  Pulse: 73  Resp: 18  Temp: (!) 96.6 F (35.9 C)     There is no height or weight on file to calculate BMI.    ECOG FS:0 - Asymptomatic  General: Well-developed, well-nourished, no acute distress. Eyes: Pink conjunctiva, anicteric sclera. Lungs: Clear to auscultation bilaterally. Heart: Regular rate  and rhythm. No rubs, murmurs, or gallops. Abdomen: Soft, nontender, nondistended. No organomegaly noted, normoactive bowel sounds. Musculoskeletal: No edema, cyanosis, or clubbing. Neuro: Alert, answering all questions appropriately. Cranial nerves grossly intact. Skin: No rashes or petechiae noted. Psych: Normal affect.   LAB RESULTS:  Lab Results  Component Value Date   NA 137 12/25/2015   K 4.0 12/25/2015   CL 99 (L) 12/25/2015   CO2 29 12/25/2015   GLUCOSE 175 (H) 12/25/2015   BUN 15 12/25/2015   CREATININE 1.15 (H) 12/25/2015   CALCIUM 8.6 (L) 12/25/2015   PROT 6.5 12/25/2015   ALBUMIN 3.1 (L) 12/25/2015   AST 25 12/25/2015   ALT 13 (L) 12/25/2015   ALKPHOS 105 12/25/2015   BILITOT 0.4 12/25/2015   GFRNONAA 42 (L) 12/25/2015   GFRAA 49 (L) 12/25/2015    Lab Results  Component Value Date   WBC 6.8 12/25/2015   NEUTROABS 5.9 12/25/2015   HGB 10.6 (L) 12/25/2015   HCT 32.5 (L) 12/25/2015   MCV 83.9 12/25/2015   PLT 86 (L) 12/25/2015   Lab Results  Component Value Date   IRON 29 11/04/2015   TIBC 407 11/04/2015   IRONPCTSAT 7 (L) 11/04/2015   Lab Results  Component Value Date   FERRITIN 18 11/04/2015     STUDIES: Dg Chest Port 1 View  Result Date: 12/13/2015 CLINICAL DATA:  Shortness of breath, hypoxia tonight. Urinary retention beginning last night. History of rectal cancer. EXAM: PORTABLE CHEST 1 VIEW  COMPARISON:  Chest radiograph April 30, 2010 FINDINGS: Patient rotated to the RIGHT. Cardiomediastinal silhouette is normal. Mildly calcified aortic knob. Mild chronic bronchitic changes. No pleural effusions or focal consolidations. Trachea projects midline and there is no pneumothorax. Soft tissue planes and included osseous structures are non-suspicious. Osteopenia. Status post approximate T11 vertebral body cement augmentation. IMPRESSION: Mild chronic bronchitic changes. Electronically Signed   By: Elon Alas M.D.   On: 12/13/2015 02:39    ASSESSMENT: Anal cancer, thrombocytopenia, iron deficiency anemia.  PLAN:    1. Anal cancer: Patient has now completed her XRT. She reports a recent digital exam was normal. No intervention is needed at this time. We will get repeat CT scan in early December or approximately 8 weeks after conclusion of her XRT for further evaluation. Return to clinic one to 2 days later to discuss the results.  2. Thrombocytopenia: Platelets main mildly decreased, but essentially unchanged. Previously patient was noted to have a positive platelet antibodies, but otherwise her laboratory work was within normal limits. Continue to monitor. Patient does not require bone marrow biopsy.  3. Iron deficiency anemia: Patient's hemoglobin only mildly improved with 2 infusions of Feraheme in September 2017. Repeat iron stores are pending at time of dictation. No further intervention is needed at this time. Return to clinic as above. 4. Urinary retention: Patient had a Foley catheter placed in the ER with no urology follow-up. A referral has been given today for further evaluation.  Approximately 30 minutes was spent in discussion of which greater than 50% was consultation.  Patient expressed understanding and was in agreement with this plan. She also understands that She can call clinic at any time with any questions, concerns, or complaints.   No matching staging information  was found for the patient.  Lloyd Huger, MD   12/29/2015 9:28 AM

## 2015-12-25 NOTE — Progress Notes (Signed)
States is feeling tired and fatigued today.

## 2015-12-26 ENCOUNTER — Ambulatory Visit: Payer: Medicare Other

## 2015-12-26 ENCOUNTER — Inpatient Hospital Stay: Payer: Medicare Other

## 2015-12-27 ENCOUNTER — Ambulatory Visit: Payer: Medicare Other

## 2015-12-30 ENCOUNTER — Ambulatory Visit: Payer: Medicare Other

## 2015-12-31 ENCOUNTER — Ambulatory Visit: Payer: Medicare Other

## 2016-01-01 ENCOUNTER — Ambulatory Visit: Payer: Medicare Other

## 2016-01-02 ENCOUNTER — Other Ambulatory Visit: Payer: Medicare Other

## 2016-01-02 ENCOUNTER — Ambulatory Visit: Payer: Medicare Other

## 2016-01-03 ENCOUNTER — Ambulatory Visit: Payer: Medicare Other

## 2016-01-06 ENCOUNTER — Ambulatory Visit: Payer: Medicare Other

## 2016-01-06 ENCOUNTER — Telehealth: Payer: Self-pay | Admitting: *Deleted

## 2016-01-06 NOTE — Telephone Encounter (Signed)
Per Dr. Grayland Ormond patient needs appointment with GYN ONC. Please schedule first available.

## 2016-01-06 NOTE — Telephone Encounter (Signed)
Called top report that patient has an ulceration/ hard lesion which is foul smelling "like cancer" at her labia. Asking what is to be done. Please advise

## 2016-01-07 ENCOUNTER — Ambulatory Visit: Payer: Medicare Other

## 2016-01-07 NOTE — Telephone Encounter (Signed)
She can be seen by Gyn Onc 11/1 at 0930. Colette please call and notify.

## 2016-01-08 ENCOUNTER — Inpatient Hospital Stay: Payer: Medicare Other | Attending: Obstetrics and Gynecology | Admitting: Obstetrics and Gynecology

## 2016-01-08 ENCOUNTER — Other Ambulatory Visit: Payer: Self-pay

## 2016-01-08 ENCOUNTER — Encounter: Payer: Self-pay | Admitting: Obstetrics and Gynecology

## 2016-01-08 VITALS — BP 111/69 | HR 89 | Temp 96.7°F | Wt 185.2 lb

## 2016-01-08 DIAGNOSIS — D509 Iron deficiency anemia, unspecified: Secondary | ICD-10-CM

## 2016-01-08 DIAGNOSIS — M4646 Discitis, unspecified, lumbar region: Secondary | ICD-10-CM

## 2016-01-08 DIAGNOSIS — F338 Other recurrent depressive disorders: Secondary | ICD-10-CM | POA: Diagnosis not present

## 2016-01-08 DIAGNOSIS — E119 Type 2 diabetes mellitus without complications: Secondary | ICD-10-CM

## 2016-01-08 DIAGNOSIS — Z79899 Other long term (current) drug therapy: Secondary | ICD-10-CM | POA: Diagnosis not present

## 2016-01-08 DIAGNOSIS — C210 Malignant neoplasm of anus, unspecified: Secondary | ICD-10-CM

## 2016-01-08 DIAGNOSIS — M179 Osteoarthritis of knee, unspecified: Secondary | ICD-10-CM | POA: Diagnosis not present

## 2016-01-08 DIAGNOSIS — K7581 Nonalcoholic steatohepatitis (NASH): Secondary | ICD-10-CM | POA: Diagnosis not present

## 2016-01-08 DIAGNOSIS — D696 Thrombocytopenia, unspecified: Secondary | ICD-10-CM

## 2016-01-08 DIAGNOSIS — L598 Other specified disorders of the skin and subcutaneous tissue related to radiation: Secondary | ICD-10-CM | POA: Diagnosis not present

## 2016-01-08 DIAGNOSIS — C20 Malignant neoplasm of rectum: Secondary | ICD-10-CM

## 2016-01-08 DIAGNOSIS — E538 Deficiency of other specified B group vitamins: Secondary | ICD-10-CM

## 2016-01-08 DIAGNOSIS — K602 Anal fissure, unspecified: Secondary | ICD-10-CM | POA: Diagnosis not present

## 2016-01-08 DIAGNOSIS — K439 Ventral hernia without obstruction or gangrene: Secondary | ICD-10-CM

## 2016-01-08 DIAGNOSIS — E118 Type 2 diabetes mellitus with unspecified complications: Secondary | ICD-10-CM | POA: Insufficient documentation

## 2016-01-08 DIAGNOSIS — K219 Gastro-esophageal reflux disease without esophagitis: Secondary | ICD-10-CM | POA: Diagnosis not present

## 2016-01-08 DIAGNOSIS — Z90722 Acquired absence of ovaries, bilateral: Secondary | ICD-10-CM

## 2016-01-08 DIAGNOSIS — I10 Essential (primary) hypertension: Secondary | ICD-10-CM

## 2016-01-08 DIAGNOSIS — Z90710 Acquired absence of both cervix and uterus: Secondary | ICD-10-CM

## 2016-01-08 DIAGNOSIS — G4730 Sleep apnea, unspecified: Secondary | ICD-10-CM | POA: Insufficient documentation

## 2016-01-08 DIAGNOSIS — L98499 Non-pressure chronic ulcer of skin of other sites with unspecified severity: Secondary | ICD-10-CM

## 2016-01-08 NOTE — Progress Notes (Signed)
Gynecologic Oncology Consult Visit   Referring Provider: Dr. Grayland Ormond  Chief Concern: "Vulvar lesion"  Subjective:  ENIYAH EASTMOND is a 82 y.o. female who is seen in consultation from Dr. Grayland Ormond for vulvar lesion.  Patient is 82 year old who lives in nursing home.  Recently completed RT for large squamous cell rectal cancer with apparent complete response.  Did not get chemo due to poor PS.    Recently vulvar lesion noted and patient referred to [facility]. The area is painful.  She had TAH/BSO in the remote past.   Saw Dr Grayland Ormond recently "1. Anal cancer: Patient has now completed her XRT. She reports a recent digital exam was normal. No intervention is needed at this time. We will get repeat CT scan in early December or approximately 8 weeks after conclusion of her XRT for further evaluation. Return to clinic one to 2 days later to discuss the results.  2. Thrombocytopenia: Platelets main mildly decreased, but essentially unchanged. Previously patient was noted to have a positive platelet antibodies, but otherwise her laboratory work was within normal limits. Continue to monitor. Patient does not require bone marrow biopsy.  3. Iron deficiency anemia: Patient's hemoglobin only mildly improved with 2 infusions of Feraheme in September 2017. Repeat iron stores are pending at time of dictation. No further intervention is needed at this time. Return to clinic as above. 4. Urinary retention: Patient had a Foley catheter placed in the ER with no urology follow-up. A referral has been given today for further evaluation."  Problem List: Patient Active Problem List   Diagnosis Date Noted  . Sleep apnea 01/08/2016  . Iron deficiency anemia 11/07/2015  . Anal cancer ([place]) 11/04/2015  . Anal fissure 09/26/2015  . Sebaceous cyst 06/27/2015  . Dermatophytosis of nail 06/28/2014  . Diabetes, polyneuropathy ([place]) 03/23/2013  . Ventral hernia 09/16/2012  . Osteoarthritis, knee 11/12/2011  . Lumbar  disc disease with radiculopathy 10/23/2010  . Chronic recurrent major depressive disorder ([place])   . Thrombocytopenia ([place])   . Anxiety   . Hypertension   . GERD (gastroesophageal reflux disease)   . Type 2 diabetes mellitus with neurological manifestations, controlled ([place])   . Osteoporosis, unspecified   . Psychosis   . Vitamin B12 deficiency   . NASH (nonalcoholic steatohepatitis)     Past Medical History: Past Medical History:  Diagnosis Date  . Anxiety   . Arthritis   . Cancer [facility])    Anal cancer  . Depression   . Diabetes mellitus   . GERD (gastroesophageal reflux disease)   . Hypertension   . Lumbar disc disease   . NASH (nonalcoholic steatohepatitis)   . Osteoporosis   . Pancytopenia    Dr Danie Chandler infusions  . Psychosis    paranoid hallucinations  . Vitamin B12 deficiency   . Wrist fracture, left 9/12    Past Surgical History: Past Surgical History:  Procedure Laterality Date  . ABDOMINAL HYSTERECTOMY    . BREAST SURGERY     breast reduction  . CATARACT EXTRACTION W/ INTRAOCULAR LENS  IMPLANT, BILATERAL    . CHOLECYSTECTOMY    . ELBOW SURGERY     4 on each  . FIXATION KYPHOPLASTY  1/12  . HEMORRHOID SURGERY    . JOINT REPLACEMENT     TKR bilateral  . MANDIBLE SURGERY     twice  . Revision of TKR  9/13   Cartilage replaced  . Right ear lesion removal  Family History: Family History  Problem Relation Age of Onset  . Cancer Mother     colon  . Heart disease Father     Social History: Social History   Social History  . Marital status: Widowed    Spouse name: N/A  . Number of children: N/A  . Years of education: N/A   Occupational History  . Nurse, retired     retired   Social History Main Topics  . Smoking status: Never Smoker  . Smokeless tobacco: Never Used  . Alcohol use No  . Drug use: No  . Sexual activity: No   Other Topics Concern  . Not on file   Social History Narrative   1 son nearby--he is to make medical  decisions   2 daughters   Has DNR   Would not accept tube feeds    Allergies: Allergies  Allergen Reactions  . Cimetidine     Other reaction(s): Unknown  . Ciprofloxacin     Other reaction(s): Unknown  . Metoclopramide     Other reaction(s): Other (See Comments) SEVERE DEPRESSION  . Propoxyphene     Other reaction(s): Unknown  . Codeine   . Demerol   . Erythromycin   . Fentanyl   . Flagyl [Metronidazole Hcl]   . Latex Rash  . Morphine And Related     Current Medications: Current Outpatient Prescriptions  Medication Sig Dispense Refill  . acetaminophen (TYLENOL) 325 MG tablet Take 650 mg by mouth every 6 (six) hours as needed.    Marland Kitchen allopurinol (ZYLOPRIM) 300 MG tablet TAKE 1 TABLET BY MOUTH ONCE DAILY FOR GOUT 30 tablet 11  . aluminum hydroxide (DERMAGRAN) ointment Apply 1 application topically daily. Apply to bottom    . bisacodyl (DULCOLAX) 5 MG EC tablet Take 5 mg by mouth every other day as needed for moderate constipation.    . clonazePAM (KLONOPIN) 0.5 MG tablet Take 0.5 mg by mouth at bedtime. And bid prn    . clonazePAM (KLONOPIN) 0.5 MG tablet Take 0.5 mg by mouth 2 (two) times daily as needed for anxiety.    . cyanocobalamin 1000 MCG tablet Take 1,000 mcg by mouth daily.     Marland Kitchen desonide (DESOWEN) 0.05 % ointment Apply 1 application topically 3 (three) times daily. To lips and corners of mouth    . Ergocalciferol (VITAMIN D2 PO) Take 1.25 mg by mouth every 30 (thirty) days.    Marland Kitchen FLUoxetine (PROZAC) 40 MG capsule Take 1 capsule (40 mg total) by mouth daily. 30 capsule 11  . furosemide (LASIX) 80 MG tablet Take 80 mg by mouth daily.     Marland Kitchen gabapentin (NEURONTIN) 300 MG capsule Take 2 capsules (600 mg total) by mouth 3 (three) times daily. Dx: E11.42 180 capsule 11  . HYDROcodone-acetaminophen (NORCO/VICODIN) 5-325 MG tablet Take 1 tablet by mouth every 6 (six) hours as needed for moderate pain. 30 tablet 0  . Lidocaine (ASPERCREME LIDOCAINE) 4 % PTCH Apply 1 patch  topically daily. Apply to lower back for 12 hours then 12 hours off.    . lidocaine (XYLOCAINE) 2 % jelly Apply 1 application topically 3 (three) times daily. Apply to perianal area    . meclizine (ANTIVERT) 25 MG tablet Take 25 mg by mouth 3 (three) times daily as needed for dizziness. Reported on 08/21/2015    . Multiple Vitamins-Minerals (MULTIVITAMIN WITH MINERALS) tablet Take 1 tablet by mouth daily.     Marland Kitchen nystatin cream (MYCOSTATIN) Apply 1 application topically 2 (  two) times daily.    Marland Kitchen omeprazole (PRILOSEC) 40 MG capsule Take 20 mg by mouth 2 (two) times daily.    Vladimir Faster Glycol-Propyl Glycol (SYSTANE) 0.4-0.3 % SOLN Place 1 drop into both eyes 4 (four) times daily.    . prochlorperazine (COMPAZINE) 10 MG tablet Take 1 tablet (10 mg total) by mouth every 6 (six) hours as needed. Take 30 min prior to taking pain medication to prevent nausea, vomiting. 30 tablet 3  . risperiDONE (RISPERDAL) 1 MG tablet Take 2 tablets (2 mg total) by mouth at bedtime. 30 tablet 11  . sennosides-docusate sodium (SENOKOT-S) 8.6-50 MG tablet Take 1 tablet by mouth 2 (two) times daily.    . traMADol (ULTRAM) 50 MG tablet Take 100 mg by mouth 3 (three) times daily.     No current facility-administered medications for this visit.     Review of Systems General: negative for, fevers, chills, fatigue, changes in sleep, changes in weight or appetite Skin: negative for changes in color, texture, moles or lesions Eyes: negative for, changes in vision, pain, diplopia HEENT: negative for, change in hearing, pain, discharge, tinnitus, vertigo, voice changes, sore throat, neck masses Breasts: negative for breast lumps Pulmonary: negative for, dyspnea, orthopnea, productive cough Cardiac: negative for, palpitations, syncope, pain, discomfort, pressure Gastrointestinal: negative for, dysphagia, nausea, vomiting, jaundice, pain, constipation, diarrhea, hematemesis, hematochezia Genitourinary/Sexual: negative for,  dysuria, discharge, hesitancy, nocturia, stones, infections, STD's Ob/Gyn: negative for, irregular bleeding, pain Musculoskeletal: negative for, pain, stiffness, swelling, range of motion limitation Hematology: negative for, easy bruising, bleeding  Objective:  Physical Examination:  BP 111/69 (BP Location: Left Arm, Patient Position: Sitting)   Pulse 89   Temp (!) 96.7 F (35.9 C) (Tympanic)   Wt 185 lb 3 oz (84 kg)   BMI 28.16 kg/m    ECOG Performance Status: 3 - Symptomatic, >50% confined to bed  General appearance: poorly responsive and debilitated HEENT:PERRLA and thyroid without masses Lymph node survey: non-palpable, axillary, inguinal, supraclavicular Cardiovascular: regular rate and rhythm Respiratory: normal air entry, lungs clear to auscultation Breast exam: not examined. Abdomen: soft, non-tender, without masses or organomegaly, no hernias and well healed incision Back: inspection of back is normal Extremities: extremities normal, atraumatic, no cyanosis or edema Skin exam - normal coloration and turgor, no rashes, no suspicious skin lesions noted. Neurological exam reveals no focal findings or movement disorder noted.  Pelvic: exam chaperoned by nurse;  Vulva: normal appearing vulva with no masses, tenderness or lesions; Foley cath in place.  There is an area of ulceration of the skin into the subcutaneous fat at the border of the vulva and right thigh that measures about 6 x 4 cm and there is a skin bridge.  No surrounding cellulitis.  Vagina: normal vagina; Adnexa: surgically absent bilateral; Uterus: surgically absent, vaginal cuff well healed;  Rectal: no masses    Assessment:  LUCCA GREGGS is a 82 y.o. female diagnosed with anal cancer s/p successful control with radiation and this was recently completed.  There is no concerning vulvar lesion.  She appears to have a fairly large skin breakdown ulcer at the border of the vulva and left thigh.  No evidence of  cellulitis in surrounding skin.  Dr Baruch Gouty from [facility] came up and thinks the radiation could have contributed to it, but its mostly due to being a moist area and pressure from lying in bed alot.    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  It may be an area that can be excised and closed surgically.   The patient's diagnosis, an outline of the further diagnostic and laboratory studies which will be required, the recommendation, and alternatives were discussed.  All questions were answered to the patient's satisfaction.  A total of 30 minutes were spent with the patient/family today; 25% was spent in education, counseling and coordination of care for skin ulcer.    Mellody Drown, MD  CC:  Venia Carbon, MD [address] [place], [place] [address] [phone]

## 2016-01-08 NOTE — Progress Notes (Signed)
  Oncology Nurse Navigator Documentation Chaperoned pelvic exam. Referral placed to wound care center. Sterile dry 4x4 dressing placed over ulcer with tegaderm Navigator Location: [facility] Onc (01/08/16 1000)   )Navigator Encounter Type: Initial GynOnc (01/08/16 1000)                     Patient Visit Type: GynOnc (01/08/16 1000)                              Time Spent with Patient: 60 (01/08/16 1000)

## 2016-01-11 DIAGNOSIS — F419 Anxiety disorder, unspecified: Secondary | ICD-10-CM | POA: Diagnosis not present

## 2016-01-11 DIAGNOSIS — C210 Malignant neoplasm of anus, unspecified: Secondary | ICD-10-CM | POA: Diagnosis not present

## 2016-01-11 DIAGNOSIS — S31109D Unspecified open wound of abdominal wall, unspecified quadrant without penetration into peritoneal cavity, subsequent encounter: Secondary | ICD-10-CM

## 2016-01-11 DIAGNOSIS — E119 Type 2 diabetes mellitus without complications: Secondary | ICD-10-CM | POA: Diagnosis not present

## 2016-01-13 ENCOUNTER — Ambulatory Visit (INDEPENDENT_AMBULATORY_CARE_PROVIDER_SITE_OTHER): Payer: Medicare Other | Admitting: Urology

## 2016-01-13 ENCOUNTER — Encounter: Payer: Self-pay | Admitting: Urology

## 2016-01-13 VITALS — BP 96/51 | HR 89 | Ht 63.5 in | Wt 186.4 lb

## 2016-01-13 DIAGNOSIS — L98499 Non-pressure chronic ulcer of skin of other sites with unspecified severity: Secondary | ICD-10-CM | POA: Diagnosis not present

## 2016-01-13 DIAGNOSIS — C210 Malignant neoplasm of anus, unspecified: Secondary | ICD-10-CM | POA: Diagnosis not present

## 2016-01-13 DIAGNOSIS — R339 Retention of urine, unspecified: Secondary | ICD-10-CM | POA: Diagnosis not present

## 2016-01-13 DIAGNOSIS — IMO0002 Reserved for concepts with insufficient information to code with codable children: Secondary | ICD-10-CM

## 2016-01-13 NOTE — Progress Notes (Signed)
01/13/2016 10:38 PM   Veronica Stafford 02/08/1929 [id number]  Referring provider: Venia Carbon, MD [address] [place], [place] [address]  Chief Complaint  Patient presents with  . New Patient (Initial Visit)    urinary retention referred by Dr. 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 Silvio Stafford.   Patient is a poor historian and her son, who is her POA, was not available at this visit.  Patient started having issues with bladder incontinence when she initiated radiation therapy for her anal cancer.  She then was brought to [facility] ED and a Foley was placed.    Patient states that she had no difficutly with urination prior to starting the radiation.    She was to have the Foley discontinued today, but there is a wound in her perineal area.    Patient has recently been referred to Hospice care.     PMH: Past Medical History:  Diagnosis Date  . Anxiety   . Arthritis   . Cancer [facility])    Anal cancer  . Depression   . Diabetes mellitus   . GERD (gastroesophageal reflux disease)   . Hypertension   . Lumbar disc disease   . NASH (nonalcoholic steatohepatitis)   . Osteoporosis   . Pancytopenia    Dr Danie Chandler infusions  . Psychosis    paranoid hallucinations  . Vitamin B12 deficiency   . Wrist fracture, left 9/12    Surgical History: Past Surgical History:  Procedure Laterality Date  . ABDOMINAL HYSTERECTOMY    . BREAST SURGERY     breast reduction  . CATARACT EXTRACTION W/ INTRAOCULAR LENS  IMPLANT, BILATERAL    . CHOLECYSTECTOMY    . ELBOW SURGERY     4 on each  . FIXATION KYPHOPLASTY  1/12  . HEMORRHOID SURGERY    . JOINT REPLACEMENT     TKR bilateral  . MANDIBLE SURGERY     twice  . Revision of TKR  9/13   Cartilage replaced  . Right ear lesion removal      Home Medications:    Medication List       Accurate as  of 01/13/16 10:38 PM. Always use your most recent med list.          acetaminophen 325 MG tablet Commonly known as:  TYLENOL Take 650 mg by mouth every 6 (six) hours as needed.   allopurinol 300 MG tablet Commonly known as:  ZYLOPRIM TAKE 1 TABLET BY MOUTH ONCE DAILY FOR GOUT   aluminum hydroxide ointment Commonly known as:  DERMAGRAN Apply 1 application topically daily. Apply to bottom   ASPERCREME LIDOCAINE 4 % Ptch Generic drug:  Lidocaine Apply 1 patch topically daily. Apply to lower back for 12 hours then 12 hours off.   bisacodyl 5 MG EC tablet Commonly known as:  DULCOLAX Take 5 mg by mouth every other day as needed for moderate constipation.   clonazePAM 0.5 MG tablet Commonly known as:  KLONOPIN Take 0.5 mg by mouth at bedtime. And bid prn   clonazePAM 0.5 MG tablet Commonly known as:  KLONOPIN Take 0.5 mg by mouth 2 (two) times daily as needed for anxiety.   cyanocobalamin 1000 MCG tablet Take 1,000 mcg by mouth daily.   desonide 0.05 % ointment Commonly known as:  DESOWEN Apply 1 application topically 3 (three) times  daily. To lips and corners of mouth   FLUoxetine 40 MG capsule Commonly known as:  PROZAC Take 1 capsule (40 mg total) by mouth daily.   furosemide 80 MG tablet Commonly known as:  LASIX Take 80 mg by mouth daily.   gabapentin 300 MG capsule Commonly known as:  NEURONTIN Take 2 capsules (600 mg total) by mouth 3 (three) times daily. Dx: E11.42   HYDROcodone-acetaminophen 5-325 MG tablet Commonly known as:  NORCO/VICODIN Take 1 tablet by mouth every 6 (six) hours as needed for moderate pain.   lidocaine 2 % jelly Commonly known as:  XYLOCAINE Apply 1 application topically 3 (three) times daily. Apply to perianal area   meclizine 25 MG tablet Commonly known as:  ANTIVERT Take 25 mg by mouth 3 (three) times daily as needed for dizziness. Reported on 08/21/2015   multivitamin with minerals tablet Take 1 tablet by mouth daily.     nystatin cream Commonly known as:  MYCOSTATIN Apply 1 application topically 2 (two) times daily.   omeprazole 40 MG capsule Commonly known as:  PRILOSEC Take 20 mg by mouth 2 (two) times daily.   prochlorperazine 10 MG tablet Commonly known as:  COMPAZINE Take 1 tablet (10 mg total) by mouth every 6 (six) hours as needed. Take 30 min prior to taking pain medication to prevent nausea, vomiting.   risperiDONE 1 MG tablet Commonly known as:  RISPERDAL Take 2 tablets (2 mg total) by mouth at bedtime.   sennosides-docusate sodium 8.6-50 MG tablet Commonly known as:  SENOKOT-S Take 1 tablet by mouth 2 (two) times daily.   SYSTANE 0.4-0.3 % Soln Generic drug:  Polyethyl Glycol-Propyl Glycol Place 1 drop into both eyes 4 (four) times daily.   traMADol 50 MG tablet Commonly known as:  ULTRAM Take 100 mg by mouth 3 (three) times daily.   VITAMIN D2 PO Take 1.25 mg by mouth every 30 (thirty) days.       Allergies:  Allergies  Allergen Reactions  . Cimetidine     Other reaction(s): Unknown  . Ciprofloxacin     Other reaction(s): Unknown  . Metoclopramide     Other reaction(s): Other (See Comments) SEVERE DEPRESSION  . Propoxyphene     Other reaction(s): Unknown  . Codeine   . Demerol   . Erythromycin   . Fentanyl   . Flagyl [Metronidazole Hcl]   . Latex Rash  . Morphine And Related     Family History: Family History  Problem Relation Age of Onset  . Cancer Mother     colon  . Heart disease Father   . Kidney disease Sister   . Bladder Cancer Neg Hx     Social History:  reports that she has never smoked. She has never used smokeless tobacco. She reports that she does not drink alcohol or use drugs.  ROS: UROLOGY Frequent Urination?: No Hard to postpone urination?: No Burning/pain with urination?: No Get up at night to urinate?: No Leakage of urine?: No Urine stream starts and stops?: No Trouble starting stream?: No Do you have to strain to urinate?:  No Blood in urine?: No Urinary tract infection?: No Sexually transmitted disease?: No Injury to kidneys or bladder?: No Painful intercourse?: No Weak stream?: No Currently pregnant?: No Vaginal bleeding?: No Last menstrual period?: n/a  Gastrointestinal Nausea?: No Vomiting?: No Indigestion/heartburn?: No Diarrhea?: No Constipation?: No  Constitutional Fever: No Night sweats?: No Weight loss?: No Fatigue?: No  Skin Skin rash/lesions?: No Itching?: No  Eyes  Blurred vision?: No Double vision?: No  Ears/Nose/Throat Sore throat?: No Sinus problems?: No  Hematologic/Lymphatic Swollen glands?: No Easy bruising?: No  Cardiovascular Leg swelling?: No Chest pain?: No  Respiratory Cough?: No Shortness of breath?: No  Endocrine Excessive thirst?: No  Musculoskeletal Back pain?: No Joint pain?: No  Neurological Headaches?: No Dizziness?: No  Psychologic Depression?: Yes Anxiety?: No  Physical Exam: BP (!) 96/51   Pulse 89   Ht 5' 3.5" (1.613 m)   Wt 186 lb 6.4 oz (84.6 kg)   BMI 32.50 kg/m   Constitutional: Well nourished. Alert and oriented, No acute distress. HEENT: [place] AT, moist mucus membranes. Trachea midline, no masses. Cardiovascular: No clubbing, cyanosis, or edema. Respiratory: Normal respiratory effort, no increased work of breathing. GI: Abdomen is soft, non tender, non distended, no abdominal masses. Liver and spleen not palpable.  No hernias appreciated.  Stool sample for occult testing is not indicated.   GU: No CVA tenderness.  No bladder fullness or masses.  Atrophic external genitalia, normal pubic hair distribution, no lesions.  Normal urethral meatus, no lesions, no prolapse, no discharge.  No urethral masses, tenderness and/or tenderness. No bladder fullness, tenderness or masses.  Pale vagina mucosa, poor estrogen effect, no discharge, no lesions, good pelvic support, no cystocele or rectocele noted.  Cervix, uterus and adnexa are  surgically absent.  Anus and perineum are without rashes or lesions.   Foley cath in place.  There is an area of ulceration of the skin into the subcutaneous fat at the border of the vulva and right thigh that measures about 6 x 4 cm and there is a skin bridge.  No surrounding cellulitis.  Three small infected sebaceous cysts in the mons pubis draining scant pus.   Skin: No rashes, bruises or suspicious lesions. Lymph: No cervical or inguinal adenopathy. Neurologic: Grossly intact, no focal deficits, moving all 4 extremities. Psychiatric: Normal mood and affect.  Laboratory Data: Lab Results  Component Value Date   WBC 6.8 12/25/2015   HGB 10.6 (L) 12/25/2015   HCT 32.5 (L) 12/25/2015   MCV 83.9 12/25/2015   PLT 86 (L) 12/25/2015    Lab Results  Component Value Date   CREATININE 1.15 (H) 12/25/2015    Lab Results  Component Value Date   HGBA1C 9.3 08/08/2015       Component Value Date/Time   LDLCALC 98 08/14/2013    Lab Results  Component Value Date   AST 25 12/25/2015   Lab Results  Component Value Date   ALT 13 (L) 12/25/2015     Assessment & Plan:    1. Urinary retention  - Patient's foley will remain in place until the right thigh wound has healed  - Foley will need to be changed monthly, but will defer to hospice care  2. Ulcer  - Patient has been referred to the wound clinic  3. Anal cancer  - Undergoing RT at this time  - has been referred to Hospice  Return in about 1 month (around 02/12/2016) for Foley catheter change.  These notes generated with voice recognition software. I apologize for typographical errors.  Zara Council, [facility] [address], [place], [place] [address] [phone]

## 2016-01-27 ENCOUNTER — Emergency Department
Admission: EM | Admit: 2016-01-27 | Discharge: 2016-01-27 | Disposition: A | Discharge status: Home / Self Care | Attending: Emergency Medicine | Admitting: Emergency Medicine

## 2016-01-27 ENCOUNTER — Emergency Department

## 2016-01-27 DIAGNOSIS — R600 Localized edema: Secondary | ICD-10-CM | POA: Insufficient documentation

## 2016-01-27 DIAGNOSIS — Z79899 Other long term (current) drug therapy: Secondary | ICD-10-CM | POA: Diagnosis not present

## 2016-01-27 DIAGNOSIS — L98499 Non-pressure chronic ulcer of skin of other sites with unspecified severity: Secondary | ICD-10-CM | POA: Insufficient documentation

## 2016-01-27 DIAGNOSIS — R1032 Left lower quadrant pain: Secondary | ICD-10-CM | POA: Diagnosis present

## 2016-01-27 DIAGNOSIS — I10 Essential (primary) hypertension: Secondary | ICD-10-CM | POA: Diagnosis not present

## 2016-01-27 DIAGNOSIS — Z85048 Personal history of other malignant neoplasm of rectum, rectosigmoid junction, and anus: Secondary | ICD-10-CM | POA: Insufficient documentation

## 2016-01-27 DIAGNOSIS — E119 Type 2 diabetes mellitus without complications: Secondary | ICD-10-CM | POA: Insufficient documentation

## 2016-01-27 LAB — CBC
HEMATOCRIT: 34.2 % — AB (ref 35.0–47.0)
HEMOGLOBIN: 11 g/dL — AB (ref 12.0–16.0)
MCH: 27.2 pg (ref 26.0–34.0)
MCHC: 32.1 g/dL (ref 32.0–36.0)
MCV: 84.6 fL (ref 80.0–100.0)
Platelets: 100 10*3/uL — ABNORMAL LOW (ref 150–440)
RBC: 4.04 MIL/uL (ref 3.80–5.20)
RDW: 17.2 % — ABNORMAL HIGH (ref 11.5–14.5)
WBC: 5 10*3/uL (ref 3.6–11.0)

## 2016-01-27 LAB — COMPREHENSIVE METABOLIC PANEL
ALBUMIN: 2.7 g/dL — AB (ref 3.5–5.0)
ALK PHOS: 107 U/L (ref 38–126)
ALT: 14 U/L (ref 14–54)
AST: 28 U/L (ref 15–41)
Anion gap: 6 (ref 5–15)
BILIRUBIN TOTAL: 0.4 mg/dL (ref 0.3–1.2)
BUN: 16 mg/dL (ref 6–20)
CALCIUM: 8.6 mg/dL — AB (ref 8.9–10.3)
CO2: 31 mmol/L (ref 22–32)
CREATININE: 1.2 mg/dL — AB (ref 0.44–1.00)
Chloride: 100 mmol/L — ABNORMAL LOW (ref 101–111)
GFR calc Af Amer: 46 mL/min — ABNORMAL LOW (ref >60)
GFR, EST NON AFRICAN AMERICAN: 40 mL/min — AB (ref >60)
GLUCOSE: 240 mg/dL — AB (ref 65–99)
Potassium: 3.7 mmol/L (ref 3.5–5.1)
Sodium: 137 mmol/L (ref 135–145)
TOTAL PROTEIN: 6.3 g/dL — AB (ref 6.5–8.1)

## 2016-01-27 MED ORDER — HYDROMORPHONE HCL 1 MG/ML IJ SOLN
1.0000 mg | Freq: Once | INTRAMUSCULAR | Status: AC
  Administered 2016-01-27: 1 mg via INTRAVENOUS

## 2016-01-27 MED ORDER — IOPAMIDOL (ISOVUE-300) INJECTION 61%
30.0000 mL | Freq: Once | INTRAVENOUS | Status: AC
  Administered 2016-01-27: 30 mL via ORAL

## 2016-01-27 MED ORDER — HYDROMORPHONE HCL 1 MG/ML IJ SOLN
INTRAMUSCULAR | Status: AC
  Filled 2016-01-27: qty 1

## 2016-01-27 MED ORDER — HYDROMORPHONE HCL 1 MG/ML IJ SOLN
0.5000 mg | Freq: Once | INTRAMUSCULAR | Status: AC
  Administered 2016-01-27: 0.5 mg via INTRAVENOUS
  Filled 2016-01-27: qty 1

## 2016-01-27 MED ORDER — HYDROMORPHONE HCL 2 MG PO TABS: 2.0000 mg | ORAL_TABLET | Freq: Four times a day (QID) | ORAL | 0 refills | Status: AC | PRN

## 2016-01-27 MED ORDER — IOPAMIDOL (ISOVUE-300) INJECTION 61%
75.0000 mL | Freq: Once | INTRAVENOUS | Status: AC | PRN
  Administered 2016-01-27: 75 mL via INTRAVENOUS

## 2016-01-27 MED ORDER — HYDROMORPHONE HCL 1 MG/ML IJ SOLN
0.5000 mg | Freq: Once | INTRAMUSCULAR | Status: AC
  Administered 2016-01-27: 0.5 mg via INTRAVENOUS

## 2016-01-27 MED ORDER — HYDROMORPHONE HCL 2 MG PO TABS
2.0000 mg | ORAL_TABLET | Freq: Once | ORAL | Status: AC
  Administered 2016-01-27: 2 mg via ORAL
  Filled 2016-01-27: qty 1

## 2016-01-27 NOTE — Progress Notes (Signed)
ED visit made. Veronica Stafford is currently followed by [facility], with a hospice diagnosis of rectal cancer. She is a DNR code. She was sent to the ED for evaluation of increased rectal and groin pain as well as increased lower left leg edema. She has a wound to her left groin for which she was prescribed doxycycline on Friday 11/16. Patient seen lying on the ED stretcher, alert and oriented, daughters at bedside. Patient speaks very quietly,  reports her pain to be "better", not gone. She has received 2 doses of IV dilaudid 0.5 mg for pain. Plan is for a doppler ultrasound of her leg and and an abdominal CT this afternoon. Support offered. Family would like to know if there is a treatable source for patient's pain. Will  await CT results. Hospice team updated. Flo Shanks RN, BSN, [facility] [place], [facility] [phone] c

## 2016-01-27 NOTE — ED Triage Notes (Signed)
Pt brought in by ems for c/o vaginal and rectal pain - pt has rectal cancer and has had 25 radiation treatments - the treatments have caused rectal wounds and a wound in the left groin area / crease of leg - pt is on doxycycline and per family and pt wounds are not infected just causing severe pain - pt here for pain control

## 2016-01-27 NOTE — ED Notes (Signed)
Pt ate about 1/3 of applesauce cup with PO pain medicine.

## 2016-01-27 NOTE — ED Notes (Signed)
Pt brought in by ems (from [facility] rm [facility]) for c/o vaginal and rectal pain - pt has rectal cancer and has had 25 radiation treatments - the treatments have caused rectal wounds and a wound in the left groin area / crease of leg - pt is on doxycycline and per family and pt wounds are not infected just causing severe pain - pt here for pain control - PA student in assessing pt

## 2016-01-27 NOTE — Discharge Instructions (Signed)
Please seek medical attention for any high fevers, chest pain, shortness of breath, change in behavior, persistent vomiting, bloody stool or any other new or concerning symptoms.  

## 2016-01-27 NOTE — ED Provider Notes (Signed)
CT abd/pel IMPRESSION: Rectal wall thickening with perirectal and perianal infiltration extending into the soft tissues along the buttock crease bilaterally which could reflect post radiation therapy change though cannot exclude residual rectal tumor by this exam.  New focal low-attenuation collections at the LEFT inguinal and LEFT pelvic regions, question necrotic adenopathy new since prior study versus developing fluid collections measuring up to 4.6 cm at LEFT inguinal region and 5.3 cm at the LEFT internal iliac region.  Cirrhotic liver with splenomegaly.  New cystic lesion at pancreatic head/uncinate 18 x 13 mm, significance better assessed by followup MR imaging when clinically appropriate.  Skin thickening at inguinal crease without discrete fluid collection.   Patient's CT did show a couple of collections concerning for possible fluid versus necrotic adenopathy. Discussed with surgery on call who evaluated the CT scan. Given lack of leukocytosis, fever or elevated platelets think unlikely abscess at this point. Would not recommend drainage at this point. The patient's pain was tolerable with oral a lot it. I discussion with patient and family. They all felt comfortable going back to living facility.   Nance Pear, MD 01/27/16 [phone]

## 2016-01-27 NOTE — ED Provider Notes (Signed)
[facility] Emergency Department Provider Note   ____________________________________________    I have reviewed the triage vital signs and the nursing notes.   HISTORY  Chief Complaint Groin Pain and Rectal Pain     HPI Veronica Stafford is a 82 y.o. female with a history of anal cancer who has received radiation and developed erythema and ulceration to her left groin. She presents with significant pain. She denies fevers or chills. Dr. Grayland Ormond is her oncologist. She is apparently also on doxycycline. Patient complains of 10/10 burning pain in her left groin. She also has swelling in her left leg. She takes Ultram and Vicodin for pain   Past Medical History:  Diagnosis Date  . Anxiety   . Arthritis   . Cancer [facility])    Anal cancer  . Depression   . Diabetes mellitus   . GERD (gastroesophageal reflux disease)   . Hypertension   . Lumbar disc disease   . NASH (nonalcoholic steatohepatitis)   . Osteoporosis   . Pancytopenia    Dr Danie Chandler infusions  . Psychosis    paranoid hallucinations  . Vitamin B12 deficiency   . Wrist fracture, left 9/12    Patient Active Problem List   Diagnosis Date Noted  . Sleep apnea 01/08/2016  . Iron deficiency anemia 11/07/2015  . Anal cancer ([place]) 11/04/2015  . Anal fissure 09/26/2015  . Sebaceous cyst 06/27/2015  . Dermatophytosis of nail 06/28/2014  . Diabetes, polyneuropathy ([place]) 03/23/2013  . Ventral hernia 09/16/2012  . Osteoarthritis, knee 11/12/2011  . Lumbar disc disease with radiculopathy 10/23/2010  . Chronic recurrent major depressive disorder ([place])   . Thrombocytopenia ([place])   . Anxiety   . Hypertension   . GERD (gastroesophageal reflux disease)   . Type 2 diabetes mellitus with neurological manifestations, controlled ([place])   . Osteoporosis, unspecified   . Psychosis   . Vitamin B12 deficiency   . NASH (nonalcoholic steatohepatitis)     Past Surgical History:  Procedure Laterality  Date  . ABDOMINAL HYSTERECTOMY    . BREAST SURGERY     breast reduction  . CATARACT EXTRACTION W/ INTRAOCULAR LENS  IMPLANT, BILATERAL    . CHOLECYSTECTOMY    . ELBOW SURGERY     4 on each  . FIXATION KYPHOPLASTY  1/12  . HEMORRHOID SURGERY    . JOINT REPLACEMENT     TKR bilateral  . MANDIBLE SURGERY     twice  . Revision of TKR  9/13   Cartilage replaced  . Right ear lesion removal      Prior to Admission medications   Medication Sig Start Date End Date Taking? Authorizing Provider  acetaminophen (TYLENOL) 325 MG tablet Take 650 mg by mouth every 6 (six) hours as needed.   Yes Historical Provider, MD  allopurinol (ZYLOPRIM) 300 MG tablet TAKE 1 TABLET BY MOUTH ONCE DAILY FOR GOUT 01/08/14  Yes Venia Carbon, MD  aluminum hydroxide [facility]) ointment Apply 1 application topically daily. Apply to bottom   Yes Historical Provider, MD  bisacodyl (DULCOLAX) 5 MG EC tablet Take 5 mg by mouth every other day as needed for moderate constipation.   Yes Historical Provider, MD  clonazePAM (KLONOPIN) 0.5 MG tablet Take 0.5 mg by mouth at bedtime as needed. And bid prn    Yes Historical Provider, MD  cyanocobalamin 1000 MCG tablet Take 1,000 mcg by mouth daily.    Yes Historical Provider, MD  desonide (DESOWEN) 0.05 % ointment Apply  1 application topically 3 (three) times daily. To lips and corners of mouth   Yes Historical Provider, MD  Ergocalciferol (VITAMIN D2 PO) Take 1.25 mg by mouth every 30 (thirty) days.   Yes Historical Provider, MD  FLUoxetine (PROZAC) 40 MG capsule Take 1 capsule (40 mg total) by mouth daily. 11/12/10  Yes Venia Carbon, MD  furosemide (LASIX) 80 MG tablet Take 80 mg by mouth daily.    Yes Historical Provider, MD  gabapentin (NEURONTIN) 300 MG capsule Take 2 capsules (600 mg total) by mouth 3 (three) times daily. Dx: E11.42 03/07/15  Yes Venia Carbon, MD  HYDROcodone-acetaminophen (NORCO/VICODIN) 5-325 MG tablet Take 1 tablet by mouth every 6 (six) hours as  needed for moderate pain. 11/04/15  Yes Lloyd Huger, MD  Lidocaine (ASPERCREME LIDOCAINE) 4 % PTCH Apply 1 patch topically daily. Apply to lower back for 12 hours then 12 hours off.   Yes Historical Provider, MD  lidocaine (XYLOCAINE) 2 % jelly Apply 1 application topically 3 (three) times daily. Apply to perianal area   Yes Historical Provider, MD  meclizine (ANTIVERT) 25 MG tablet Take 25 mg by mouth 3 (three) times daily as needed for dizziness. Reported on 08/21/2015   Yes Historical Provider, MD  Multiple Vitamins-Minerals (MULTIVITAMIN WITH MINERALS) tablet Take 1 tablet by mouth daily.    Yes Historical Provider, MD  omeprazole (PRILOSEC) 40 MG capsule Take 20 mg by mouth 2 (two) times daily.   Yes Historical Provider, MD  prochlorperazine (COMPAZINE) 10 MG tablet Take 1 tablet (10 mg total) by mouth every 6 (six) hours as needed. Take 30 min prior to taking pain medication to prevent nausea, vomiting. 11/04/15  Yes Lloyd Huger, MD  risperiDONE (RISPERDAL) 1 MG tablet Take 2 tablets (2 mg total) by mouth at bedtime. 09/16/10  Yes Venia Carbon, MD  sennosides-docusate sodium (SENOKOT-S) 8.6-50 MG tablet Take 1 tablet by mouth 2 (two) times daily.   Yes Historical Provider, MD  traMADol (ULTRAM) 50 MG tablet Take 100 mg by mouth 3 (three) times daily.   Yes Historical Provider, MD  clonazePAM (KLONOPIN) 0.5 MG tablet Take 0.5 mg by mouth 2 (two) times daily as needed for anxiety.    Historical Provider, MD     Allergies Cimetidine; Ciprofloxacin; Metoclopramide; Propoxyphene; Codeine; Demerol; Erythromycin; Fentanyl; Flagyl [metronidazole hcl]; Latex; and Morphine and related  Family History  Problem Relation Age of Onset  . Cancer Mother     colon  . Heart disease Father   . Kidney disease Sister   . Bladder Cancer Neg Hx     Social History Social History  Substance Use Topics  . Smoking status: Never Smoker  . Smokeless tobacco: Never Used  . Alcohol use No     Review of Systems  Constitutional: No fever/chills  Cardiovascular: Denies chest pain. Respiratory: Denies shortness of breath. Gastrointestinal: No abdominal pain.   Genitourinary: Groin pain as above, no dysuria Musculoskeletal: Negative for back pain. Skin: Redness as above   10-point ROS otherwise negative.  ____________________________________________   PHYSICAL EXAM:  VITAL SIGNS: ED Triage Vitals  Enc Vitals Group     BP 01/27/16 1209 118/66     Pulse Rate 01/27/16 1209 95     Resp 01/27/16 1209 16     Temp 01/27/16 1209 98 F (36.7 C)     Temp src --      SpO2 01/27/16 1209 95 %     Weight 01/27/16 1209 186 lb (  84.4 kg)     Height 01/27/16 1209 5\' 8"  (1.727 m)     Head Circumference --      Peak Flow --      Pain Score 01/27/16 1210 10     Pain Loc --      Pain Edu? --      Excl. in [place]? --     Constitutional: Alert and oriented. No acute distress.  Eyes: Conjunctivae are normal.   Nose: No congestion/rhinnorhea. Mouth/Throat: Mucous membranes are moist.    Cardiovascular: Normal rate, regular rhythm. Grossly normal heart sounds.  Good peripheral circulation. Respiratory: Normal respiratory effort.  No retractions. Lungs CTAB. Gastrointestinal: Soft and nontender. No distention.  No CVA tenderness. Genitourinary: Left groin: several small areas of deep ulceration with mild surrounding erythema, no fluctuance , areas moist and inflamed but not consistent with cellulitis or Fournier's. No crepitus Musculoskeletal: Left leg is edematous, no tenderness to palpation, no erythema or rash.  Warm and well perfused bilaterally Neurologic:  Normal speech and language. No gross focal neurologic deficits are appreciated.  Skin:  Skin is warm, dry otherwise Psychiatric: Mood and affect are normal. Speech and behavior are normal.  ____________________________________________   LABS (all labs ordered are listed, but only abnormal results are displayed)  Labs  Reviewed  CBC - Abnormal; Notable for the following:       Result Value   Hemoglobin 11.0 (*)    HCT 34.2 (*)    RDW 17.2 (*)    Platelets 100 (*)    All other components within normal limits  COMPREHENSIVE METABOLIC PANEL - Abnormal; Notable for the following:    Chloride 100 (*)    Glucose, Bld 240 (*)    Creatinine, Ser 1.20 (*)    Calcium 8.6 (*)    Total Protein 6.3 (*)    Albumin 2.7 (*)    GFR calc non Af Amer 40 (*)    GFR calc Af Amer 46 (*)    All other components within normal limits   ____________________________________________  EKG  None ____________________________________________  RADIOLOGY  Ultrasound negative for DVT ____________________________________________   PROCEDURES  Procedure(s) performed: No    Critical Care performed: No ____________________________________________   INITIAL IMPRESSION / ASSESSMENT AND PLAN / ED COURSE  Pertinent labs & imaging results that were available during my care of the patient were reviewed by me and considered in my medical decision making (see chart for details).  Patient presents with left groin pain and left leg swelling. Ultrasound of the left leg is unremarkable. Lab work is reassuring. The patient is on doxycycline. We'll obtain CT abdomen and pelvis to evaluate further for possible infection of the left groin. Patient is received Dilaudid 0.5 mg IV 2 for pain.  I have asked Dr. Archie Balboa to follow up on the CT scan.  Clinical Course    ____________________________________________   FINAL CLINICAL IMPRESSION(S) / ED DIAGNOSES  Final diagnoses:  Left inguinal pain  Lower extremity edema      NEW MEDICATIONS STARTED DURING THIS VISIT:  New Prescriptions   No medications on file     Note:  This document was prepared using Dragon voice recognition software and may include unintentional dictation errors.    Lavonia Drafts, MD 01/27/16 [phone]

## 2016-01-27 NOTE — ED Notes (Signed)
Secretary notified of need for ems transport back to [facility]

## 2016-01-28 ENCOUNTER — Telehealth: Payer: Self-pay

## 2016-01-28 DIAGNOSIS — C210 Malignant neoplasm of anus, unspecified: Secondary | ICD-10-CM | POA: Diagnosis not present

## 2016-01-28 DIAGNOSIS — C651 Malignant neoplasm of right renal pelvis: Secondary | ICD-10-CM | POA: Diagnosis not present

## 2016-01-28 DIAGNOSIS — E1142 Type 2 diabetes mellitus with diabetic polyneuropathy: Secondary | ICD-10-CM | POA: Diagnosis not present

## 2016-01-28 DIAGNOSIS — K7460 Unspecified cirrhosis of liver: Secondary | ICD-10-CM

## 2016-01-28 DIAGNOSIS — L97909 Non-pressure chronic ulcer of unspecified part of unspecified lower leg with unspecified severity: Secondary | ICD-10-CM | POA: Diagnosis not present

## 2016-01-28 DIAGNOSIS — F339 Major depressive disorder, recurrent, unspecified: Secondary | ICD-10-CM

## 2016-01-28 NOTE — Telephone Encounter (Signed)
Shelly nurse with Hospice left v/m; pt was seen at ED 01/27/16 and pt was given dilaudid; Shelly said ultram is not helping pts pain and wants to know if Dr Silvio Pate will d/c ultram and order Norco scheduled. Shelly request cb.

## 2016-01-29 ENCOUNTER — Ambulatory Visit: Payer: Medicare Other | Admitting: Radiation Oncology

## 2016-01-29 NOTE — Telephone Encounter (Signed)
I did see her yesterday and order the dilaudid as scheduled since she seemed to tolerate that better than hydrocodone

## 2016-02-04 ENCOUNTER — Ambulatory Visit: Payer: Medicare Other | Admitting: Oncology

## 2016-02-04 ENCOUNTER — Other Ambulatory Visit: Payer: Medicare Other

## 2016-02-04 ENCOUNTER — Ambulatory Visit: Payer: Medicare Other | Admitting: Radiation Oncology

## 2016-02-10 NOTE — Progress Notes (Deleted)
[place]  [phone] [phone]  ID: Veronica Stafford OB: [birth year]  MR#: [id number]  HQI#:[id number]  Patient Care Team: Venia Carbon, MD as PCP - General  CHIEF COMPLAINT: Anal cancer, thrombocytopenia, iron deficiency anemia.  INTERVAL HISTORY: Patient returns to clinic today for repeat laboratory work and further evaluation. She is having significant problems with urinary retention. He continues to have pain, but this is improved. She otherwise feels well. She has no neurologic complaints. She denies any bleeding or easy bruising. She has a good appetite and denies weight loss. She denies any recent fevers or illnesses. She has no chest pain or shortness of breath. She denies any nausea, vomiting, constipation, or diarrhea. She has no urinary complaints. Patient offers no further specific complaints.  REVIEW OF SYSTEMS:   Review of Systems  Constitutional: Negative for fever, malaise/fatigue and weight loss.  Respiratory: Negative.  Negative for cough and shortness of breath.   Cardiovascular: Negative.  Negative for chest pain and leg swelling.  Gastrointestinal: Positive for abdominal pain. Negative for blood in stool, constipation, diarrhea, melena, nausea and vomiting.  Genitourinary: Positive for dysuria and frequency.       Urinary retention.  Musculoskeletal: Negative.   Neurological: Negative.  Negative for weakness.  Psychiatric/Behavioral: The patient is nervous/anxious.     As per HPI. Otherwise, a complete review of systems is negative.  PAST MEDICAL HISTORY: Past Medical History:  Diagnosis Date  . Anxiety   . Arthritis   . Cancer [facility])    Anal cancer  . Depression   . Diabetes mellitus   . GERD (gastroesophageal reflux disease)   . Hypertension   . Lumbar disc disease   . NASH (nonalcoholic steatohepatitis)   . Osteoporosis   . Pancytopenia    Dr Danie Chandler infusions  . Psychosis    paranoid hallucinations    . Vitamin B12 deficiency   . Wrist fracture, left 9/12    PAST SURGICAL HISTORY: Past Surgical History:  Procedure Laterality Date  . ABDOMINAL HYSTERECTOMY    . BREAST SURGERY     breast reduction  . CATARACT EXTRACTION W/ INTRAOCULAR LENS  IMPLANT, BILATERAL    . CHOLECYSTECTOMY    . ELBOW SURGERY     4 on each  . FIXATION KYPHOPLASTY  1/12  . HEMORRHOID SURGERY    . JOINT REPLACEMENT     TKR bilateral  . MANDIBLE SURGERY     twice  . Revision of TKR  9/13   Cartilage replaced  . Right ear lesion removal      FAMILY HISTORY: Family History  Problem Relation Age of Onset  . Cancer Mother     colon  . Heart disease Father   . Kidney disease Sister   . Bladder Cancer Neg Hx        ADVANCED DIRECTIVES (Y/N):  N   HEALTH MAINTENANCE: Social History  Substance Use Topics  . Smoking status: Never Smoker  . Smokeless tobacco: Never Used  . Alcohol use No     Colonoscopy:  PAP:  Bone density:  Lipid panel:  Allergies  Allergen Reactions  . Cimetidine     Other reaction(s): Unknown  . Ciprofloxacin     Other reaction(s): Unknown  . Metoclopramide     Other reaction(s): Other (See Comments) SEVERE DEPRESSION  . Propoxyphene     Other reaction(s): Unknown  . Codeine   . Demerol   . Erythromycin   . Fentanyl   . Flagyl [  Metronidazole Hcl]   . Latex Rash  . Morphine And Related     Current Outpatient Prescriptions  Medication Sig Dispense Refill  . acetaminophen (TYLENOL) 325 MG tablet Take 650 mg by mouth every 6 (six) hours as needed.    Marland Kitchen allopurinol (ZYLOPRIM) 300 MG tablet TAKE 1 TABLET BY MOUTH ONCE DAILY FOR GOUT 30 tablet 11  . aluminum hydroxide (DERMAGRAN) ointment Apply 1 application topically daily. Apply to bottom    . bisacodyl (DULCOLAX) 5 MG EC tablet Take 5 mg by mouth every other day as needed for moderate constipation.    . clonazePAM (KLONOPIN) 0.5 MG tablet Take 0.5 mg by mouth at bedtime as needed. And bid prn     . clonazePAM  (KLONOPIN) 0.5 MG tablet Take 0.5 mg by mouth 2 (two) times daily as needed for anxiety.    . cyanocobalamin 1000 MCG tablet Take 1,000 mcg by mouth daily.     Marland Kitchen desonide (DESOWEN) 0.05 % ointment Apply 1 application topically 3 (three) times daily. To lips and corners of mouth    . Ergocalciferol (VITAMIN D2 PO) Take 1.25 mg by mouth every 30 (thirty) days.    Marland Kitchen FLUoxetine (PROZAC) 40 MG capsule Take 1 capsule (40 mg total) by mouth daily. 30 capsule 11  . furosemide (LASIX) 80 MG tablet Take 80 mg by mouth daily.     Marland Kitchen gabapentin (NEURONTIN) 300 MG capsule Take 2 capsules (600 mg total) by mouth 3 (three) times daily. Dx: E11.42 180 capsule 11  . HYDROcodone-acetaminophen (NORCO/VICODIN) 5-325 MG tablet Take 1 tablet by mouth every 6 (six) hours as needed for moderate pain. 30 tablet 0  . HYDROmorphone (DILAUDID) 2 MG tablet Take 1 tablet (2 mg total) by mouth every 6 (six) hours as needed for severe pain. 20 tablet 0  . Lidocaine (ASPERCREME LIDOCAINE) 4 % PTCH Apply 1 patch topically daily. Apply to lower back for 12 hours then 12 hours off.    . lidocaine (XYLOCAINE) 2 % jelly Apply 1 application topically 3 (three) times daily. Apply to perianal area    . meclizine (ANTIVERT) 25 MG tablet Take 25 mg by mouth 3 (three) times daily as needed for dizziness. Reported on 08/21/2015    . Multiple Vitamins-Minerals (MULTIVITAMIN WITH MINERALS) tablet Take 1 tablet by mouth daily.     Marland Kitchen omeprazole (PRILOSEC) 40 MG capsule Take 20 mg by mouth 2 (two) times daily.    . prochlorperazine (COMPAZINE) 10 MG tablet Take 1 tablet (10 mg total) by mouth every 6 (six) hours as needed. Take 30 min prior to taking pain medication to prevent nausea, vomiting. 30 tablet 3  . risperiDONE (RISPERDAL) 1 MG tablet Take 2 tablets (2 mg total) by mouth at bedtime. 30 tablet 11  . sennosides-docusate sodium (SENOKOT-S) 8.6-50 MG tablet Take 1 tablet by mouth 2 (two) times daily.    . traMADol (ULTRAM) 50 MG tablet Take 100  mg by mouth 3 (three) times daily.     No current facility-administered medications for this visit.     OBJECTIVE: There were no vitals filed for this visit.   There is no height or weight on file to calculate BMI.    ECOG FS:0 - Asymptomatic  General: Well-developed, well-nourished, no acute distress. Eyes: Pink conjunctiva, anicteric sclera. Lungs: Clear to auscultation bilaterally. Heart: Regular rate and rhythm. No rubs, murmurs, or gallops. Abdomen: Soft, nontender, nondistended. No organomegaly noted, normoactive bowel sounds. Musculoskeletal: No edema, cyanosis, or clubbing. Neuro: Alert,  answering all questions appropriately. Cranial nerves grossly intact. Skin: No rashes or petechiae noted. Psych: Normal affect.   LAB RESULTS:  Lab Results  Component Value Date   NA 137 01/27/2016   K 3.7 01/27/2016   CL 100 (L) 01/27/2016   CO2 31 01/27/2016   GLUCOSE 240 (H) 01/27/2016   BUN 16 01/27/2016   CREATININE 1.20 (H) 01/27/2016   CALCIUM 8.6 (L) 01/27/2016   PROT 6.3 (L) 01/27/2016   ALBUMIN 2.7 (L) 01/27/2016   AST 28 01/27/2016   ALT 14 01/27/2016   ALKPHOS 107 01/27/2016   BILITOT 0.4 01/27/2016   GFRNONAA 40 (L) 01/27/2016   GFRAA 46 (L) 01/27/2016    Lab Results  Component Value Date   WBC 5.0 01/27/2016   NEUTROABS 5.9 12/25/2015   HGB 11.0 (L) 01/27/2016   HCT 34.2 (L) 01/27/2016   MCV 84.6 01/27/2016   PLT 100 (L) 01/27/2016   Lab Results  Component Value Date   IRON 29 11/04/2015   TIBC 407 11/04/2015   IRONPCTSAT 7 (L) 11/04/2015   Lab Results  Component Value Date   FERRITIN 18 11/04/2015     STUDIES: Ct Abdomen Pelvis W Contrast  Result Date: 01/27/2016 CLINICAL DATA:  Vaginal and rectal pain post radiation therapy for rectal cancer. Has rectal wounds and LEFT groin wound, air edema and ulceration question infection, severe pain, history NASH, type II diabetes mellitus, hypertension EXAM: CT ABDOMEN AND PELVIS WITH CONTRAST TECHNIQUE:  Multidetector CT imaging of the abdomen and pelvis was performed using the standard protocol following bolus administration of intravenous contrast. Sagittal and coronal MPR images reconstructed from axial data set. CONTRAST:  39m ISOVUE-300 IOPAMIDOL (ISOVUE-300) INJECTION 61% IV. Dilute oral contrast. COMPARISON:  11/08/2015 FINDINGS: Lower chest: Bibasilar atelectasis. Question 8 mm RIGHT lower lobe nodule image 11. Hepatobiliary: Nodular cirrhotic appearing liver. Gallbladder surgically absent. Minimal central biliary dilatation. No definite hepatic mass. Pancreas: Small cystic lesion at pancreatic head/uncinate 18 x 13 mm image 39, new. Remainder of pancreas unremarkable. Spleen: Splenomegaly, 14.1 x 12.5 x 7.2 cm (volume = 660 cm3). No focal splenic lesion. Adrenals/Urinary Tract: Adrenal glands unremarkable. BILATERAL renal cysts. No hydronephrosis or ureteral dilatation. Foley catheter within urinary bladder. Non thickened bladder wall may be related to prior pelvic irradiation, chronic cystitis, or combination of both. Stomach/Bowel: Contracted stomach. Small bowel loops unremarkable. Rectal wall thickening which could reflect tumor and/or radiation change. Thickening of the perirectal soft tissues into the medial aspects of the buttock crease bilaterally. Remainder of colon grossly unremarkable. Vascular/Lymphatic: Scattered atherosclerotic calcifications aorta and coronary arteries. Aorta normal caliber. Small nodes adjacent to LEFT common iliac artery, non pathologically enlarged but new since previous exam. Question necrotic LEFT pelvic nodes versus fluid collections. Interval increase in size of a LEFT inguinal node now 15 mm short axis previously 7 mm. Fluid collection along LEFT pelvic sidewall 3.3 x 5.3 cm, new. Additional suspected low-attenuation/necrotic LEFT external iliac node 3.4 x 1.6 cm image 66. Focal fluid collection at LEFT inguinal region 4.7 x 3.1 cm image 78 question necrotic node  versus developing inflammatory collection. Reproductive: Uterus surgically absent. Atrophic RIGHT over and questionable early visualized LEFT ovary. Other: No free air free fluid. No hernia. Skin thickening along the LEFT inguinal crease without definite abscess. Musculoskeletal: Marked osseous demineralization. Osseous changes of the pubic symphysis question due to prior trauma or pelvic irradiation. Prior spinal augmentation procedure at T12. Superior endplate compression deformity L2. Grade 1 anterolisthesis L3-L4 unchanged. Multilevel degenerative disc and  facet disease changes. IMPRESSION: Rectal wall thickening with perirectal and perianal infiltration extending into the soft tissues along the buttock crease bilaterally which could reflect post radiation therapy change though cannot exclude residual rectal tumor by this exam. New focal low-attenuation collections at the LEFT inguinal and LEFT pelvic regions, question necrotic adenopathy new since prior study versus developing fluid collections measuring up to 4.6 cm at LEFT inguinal region and 5.3 cm at the LEFT internal iliac region. Cirrhotic liver with splenomegaly. New cystic lesion at pancreatic head/uncinate 18 x 13 mm, significance better assessed by followup MR imaging when clinically appropriate. Skin thickening at inguinal crease without discrete fluid collection. Electronically Signed   By: Lavonia Dana M.D.   On: 01/27/2016 17:30   US Venous Img Lower Unilateral Left  Result Date: 01/27/2016 CLINICAL DATA:  82 year old female EXAM: LEFT LOWER EXTREMITY VENOUS DOPPLER ULTRASOUND TECHNIQUE: Gray-scale sonography with graded compression, as well as color Doppler and duplex ultrasound were performed to evaluate the lower extremity deep venous systems from the level of the common femoral vein and including the common femoral, femoral, profunda femoral, popliteal and calf veins including the posterior tibial, peroneal and gastrocnemius veins when  visible. The superficial great saphenous vein was also interrogated. Spectral Doppler was utilized to evaluate flow at rest and with distal augmentation maneuvers in the common femoral, femoral and popliteal veins. COMPARISON:  None. FINDINGS: Contralateral Common Femoral Vein: Respiratory phasicity is normal and symmetric with the symptomatic side. No evidence of thrombus. Normal compressibility. Common Femoral Vein: No evidence of thrombus. Normal compressibility, respiratory phasicity and response to augmentation. Saphenofemoral Junction: No evidence of thrombus. Normal compressibility and flow on color Doppler imaging. Profunda Femoral Vein: No evidence of thrombus. Normal compressibility and flow on color Doppler imaging. Femoral Vein: No evidence of thrombus. Normal compressibility, respiratory phasicity and response to augmentation. Popliteal Vein: No evidence of thrombus. Normal compressibility, respiratory phasicity and response to augmentation. Calf Veins: No evidence of thrombus. Normal compressibility and flow on color Doppler imaging. Superficial Great Saphenous Vein: No evidence of thrombus. Normal compressibility and flow on color Doppler imaging. Venous Reflux:  None. Other Findings: Somewhat ill-defined hypoechoic structures are noted in the deep superficial subcutaneous fat of the left groin just anterior and medial to the femoral vessels. The structure is not well seen, slightly amorphous and very deep in the tissues. The structure Measures approximately 1.7 cm in short axis. IMPRESSION: 1. No evidence of deep venous thrombosis. 2. In the region of clinical symptoms in the left groin, there is an incidentally imaged hypoechoic structure adjacent to the femoral vessels in the deep subcutaneous fat. This may represent an enlarged deep inguinal lymph node. Please note the structure is very poorly seen sonographically. Consider further evaluation with a CT scan of the pelvis with intravenous contrast  to rule out the possibility of metastatic adenopathy. Electronically Signed   By: Jacqulynn Cadet M.D.   On: 01/27/2016 14:52    ASSESSMENT: Anal cancer, thrombocytopenia, iron deficiency anemia.  PLAN:    1. Anal cancer: Patient has now completed her XRT. She reports a recent digital exam was normal. No intervention is needed at this time. We will get repeat CT scan in early December or approximately 8 weeks after conclusion of her XRT for further evaluation. Return to clinic one to 2 days later to discuss the results.  2. Thrombocytopenia: Platelets main mildly decreased, but essentially unchanged. Previously patient was noted to have a positive platelet antibodies, but otherwise her laboratory  work was within normal limits. Continue to monitor. Patient does not require bone marrow biopsy.  3. Iron deficiency anemia: Patient's hemoglobin only mildly improved with 2 infusions of Feraheme in September 2017. Repeat iron stores are pending at time of dictation. No further intervention is needed at this time. Return to clinic as above. 4. Urinary retention: Patient had a Foley catheter placed in the ER with no urology follow-up. A referral has been given today for further evaluation.  Approximately 30 minutes was spent in discussion of which greater than 50% was consultation.  Patient expressed understanding and was in agreement with this plan. She also understands that She can call clinic at any time with any questions, concerns, or complaints.   No matching staging information was found for the patient.  Lloyd Huger, MD   02/10/2016 9:45 PM

## 2016-02-11 ENCOUNTER — Inpatient Hospital Stay: Payer: Medicare Other | Admitting: Oncology

## 2016-02-11 ENCOUNTER — Other Ambulatory Visit: Payer: Medicare Other

## 2016-02-11 ENCOUNTER — Ambulatory Visit: Payer: Medicare Other

## 2016-02-13 ENCOUNTER — Ambulatory Visit: Payer: Medicare Other | Admitting: Oncology

## 2016-02-13 DIAGNOSIS — F330 Major depressive disorder, recurrent, mild: Secondary | ICD-10-CM | POA: Diagnosis not present

## 2016-02-13 DIAGNOSIS — K651 Peritoneal abscess: Secondary | ICD-10-CM | POA: Diagnosis not present

## 2016-02-13 DIAGNOSIS — C210 Malignant neoplasm of anus, unspecified: Secondary | ICD-10-CM | POA: Diagnosis not present

## 2016-02-13 DIAGNOSIS — K7460 Unspecified cirrhosis of liver: Secondary | ICD-10-CM | POA: Diagnosis not present

## 2016-02-17 ENCOUNTER — Telehealth: Payer: Self-pay

## 2016-02-17 NOTE — Telephone Encounter (Signed)
PLEASE NOTE: All timestamps contained within this report are represented as [place] Standard Time. CONFIDENTIALTY NOTICE: This fax transmission is intended only for the addressee. It contains information that is legally privileged, confidential or otherwise protected from use or disclosure. If you are not the intended recipient, you are strictly prohibited from reviewing, disclosing, copying using or disseminating any of this information or taking any action in reliance on or regarding this information. If you have received this fax in error, please notify us immediately by telephone so that we can arrange for its return to [place]. Phone: [phone], Toll-Free: [phone], Fax: [phone] Page: 1 of 1 Call Id: [id number] [place] Night - Client Nonclinical Telephone Record [place] Night - Client Client Site [place] Physician Viviana Simpler - MD Contact Type Call Call [place] Page Now Who Is [place] / [place] Name [place] Name [place] Number [phone] Patient Name Semhar Ordoyne Patient DOB 1929/02/18 Reason for Call Request to speak to Physician Initial Comment Caller says that the patient has a fever of 102.5, he heart rate was raising. She has cancer. She is a hospice patient. She is calling to report the changes of condition and see what the doctor will like to do. She has motoring her pain level every hour. She was given Tylenol for the temperature. Her doctor is Dr. Viviana Simpler Additional Comment The hospice nurse is there as well. Paging DoctorName Phone DateTime Result/Outcome Message Type Notes Garnet Koyanagi - MD AP:[phone] 02/16/2016 6:36:21 PM Paged On Call Back to Call Center Doctor Paged This is Andi with [facility], I have a page here for you. If you  would just give a call back at [phone]. Garnet Koyanagi - MD AP:[phone] 02/16/2016 6:56:47 PM Paged On Call Back to Call Center Doctor Paged Garnet Koyanagi - MD AP:[phone] 02/16/2016 7:28:43 PM Paged On Call Back to Call Center Doctor Paged Garnet Koyanagi - MD AP:[phone] 02/16/2016 7:49:32 PM Paged On Call Back to Call Center Doctor Paged Please call Quillian Quince with [employer] at [phone] regarding a page, thank you. Garnet Koyanagi - MD 02/16/2016 8:00:49 PM Spoke with On Call - General Message Result Call Closed By: Oswaldo Milian Transaction Date/Time: 02/16/2016 6:25:17 PM (ET)

## 2016-02-17 NOTE — Telephone Encounter (Signed)
This was addressed this morning at [facility]

## 2016-02-18 ENCOUNTER — Ambulatory Visit: Payer: Medicare Other

## 2016-02-19 ENCOUNTER — Ambulatory Visit: Payer: Medicare Other | Admitting: Oncology

## 2016-02-28 ENCOUNTER — Other Ambulatory Visit: Payer: Self-pay | Admitting: Nurse Practitioner

## 2016-03-09 DEATH — deceased

## 2016-12-31 IMAGING — DX DG CHEST 1V PORT
1 series · 1 of 1 positions shown · non-contrast
Comparison: Chest radiograph April 30, 2010

CLINICAL DATA: Shortness of breath, hypoxia tonight. Urinary
retention beginning last night. History of rectal cancer.

EXAM:
PORTABLE CHEST 1 VIEW

[chest ap]
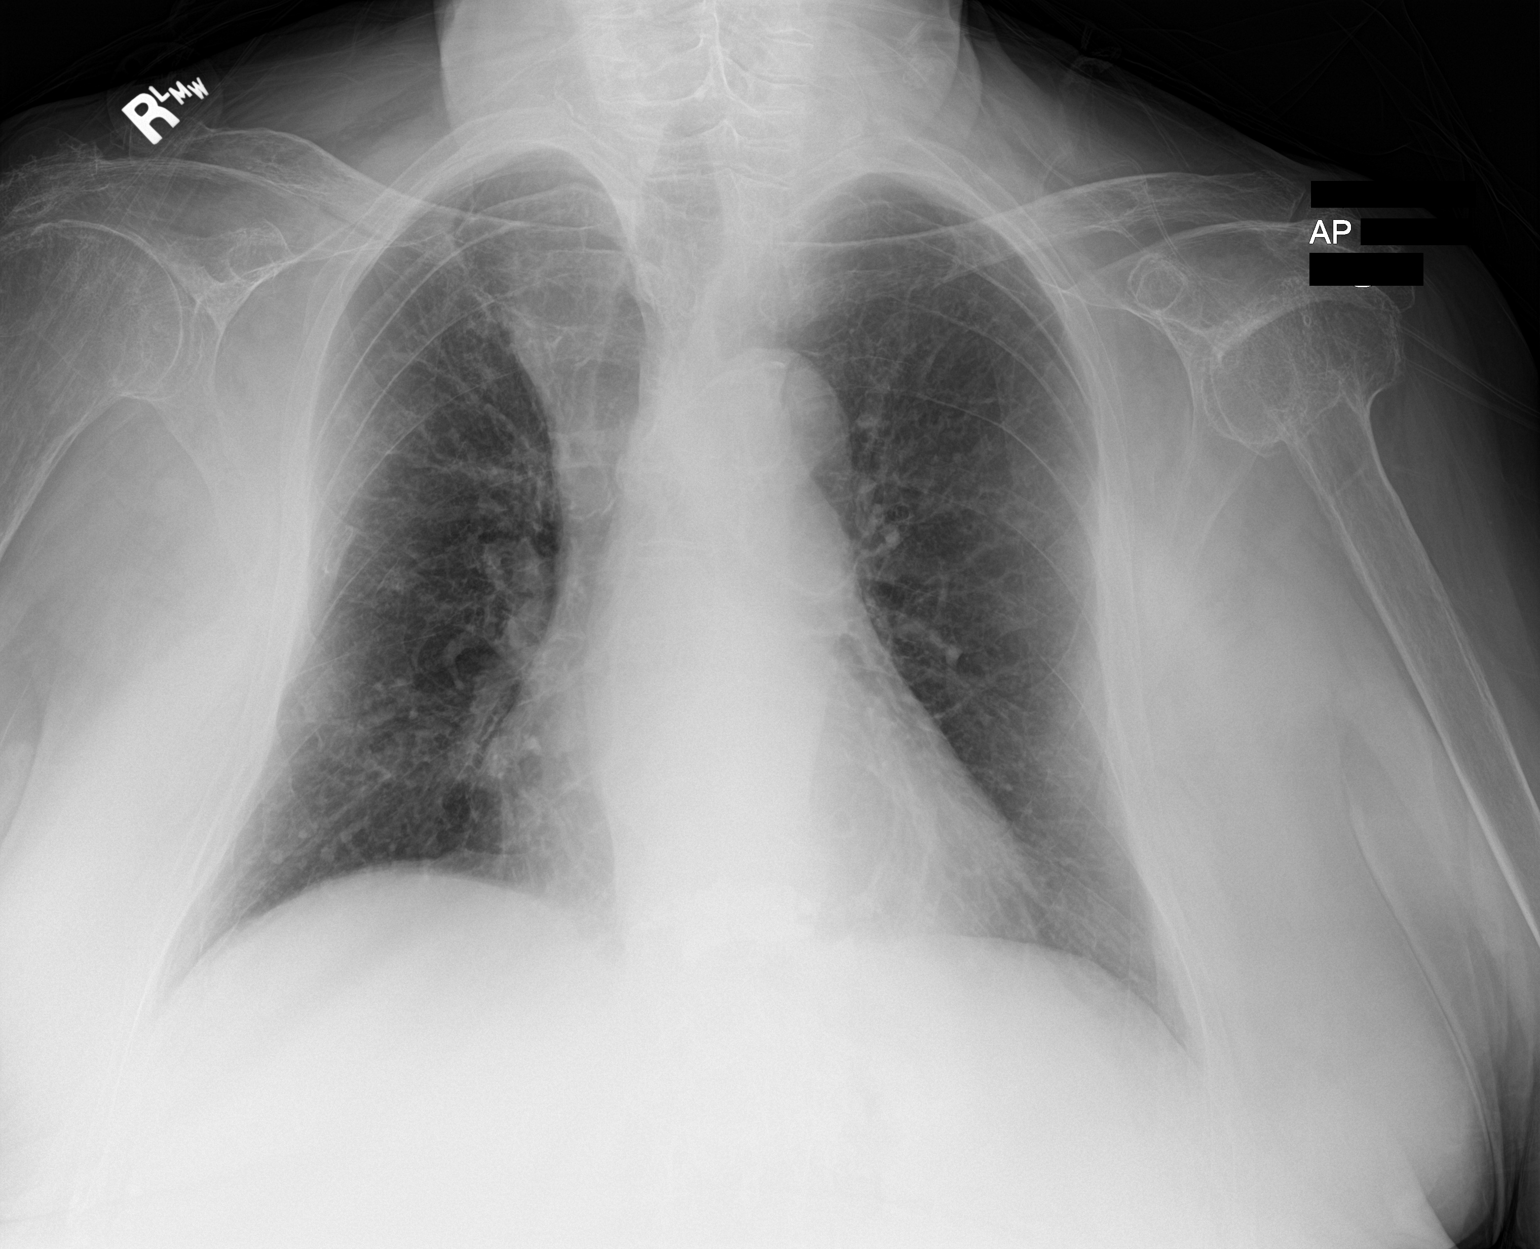

[1 of 1 positions shown; findings below may reference images not displayed]

FINDINGS: Patient rotated to the RIGHT. Cardiomediastinal silhouette is
normal. Mildly calcified aortic knob. Mild chronic bronchitic
changes. No pleural effusions or focal consolidations. Trachea
projects midline and there is no pneumothorax. Soft tissue planes
and included osseous structures are non-suspicious. Osteopenia.
Status post approximate T11 vertebral body cement augmentation.
IMPRESSION: Mild chronic bronchitic changes.

## 2017-02-14 IMAGING — CT CT ABD-PELV W/ CM
2 of 8 series · 15 of 46 positions shown, 17 images · IV contrast (APPLIED)
Comparison: 11/08/2015

CLINICAL DATA: Vaginal and rectal pain post radiation therapy for
rectal cancer. Has rectal wounds and LEFT groin wound, air edema and
ulceration question infection, severe pain, history NASH, type II
diabetes mellitus, hypertension

EXAM:
CT ABDOMEN AND PELVIS WITH CONTRAST
TECHNIQUE: Multidetector CT imaging of the abdomen and pelvis was performed
using the standard protocol following bolus administration of
intravenous contrast. Sagittal and coronal MPR images reconstructed
from axial data set.
CONTRAST:  75mL [id number]-U99 IOPAMIDOL (65E7IX-U99) INJECTION 61% IV.
Dilute oral contrast.

[Series 2: axial st · axial · 0.85mm/px · z∈[-502,-112]mm · 12 of 90 slices shown, 14 images]
[im 6/90  soft-tissue]
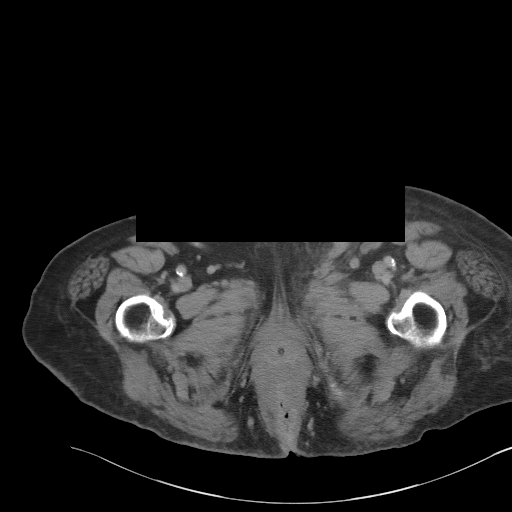
[im 6/90  bone]
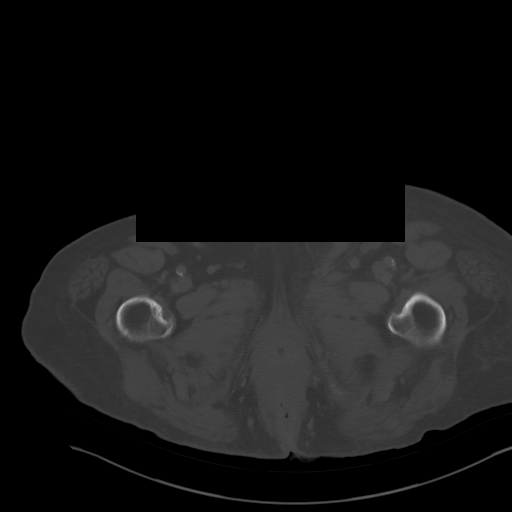
[im 12/90  soft-tissue]
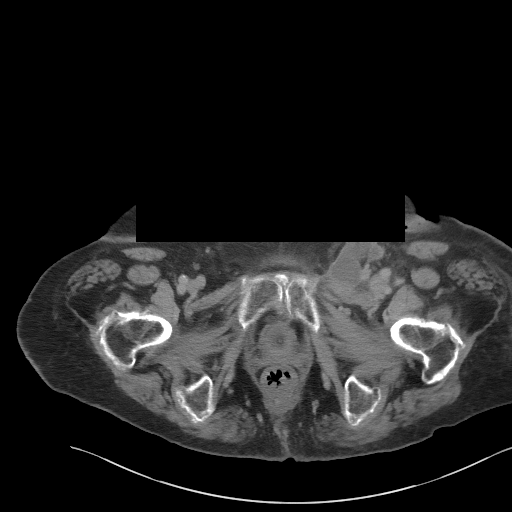
[im 23/90  soft-tissue]
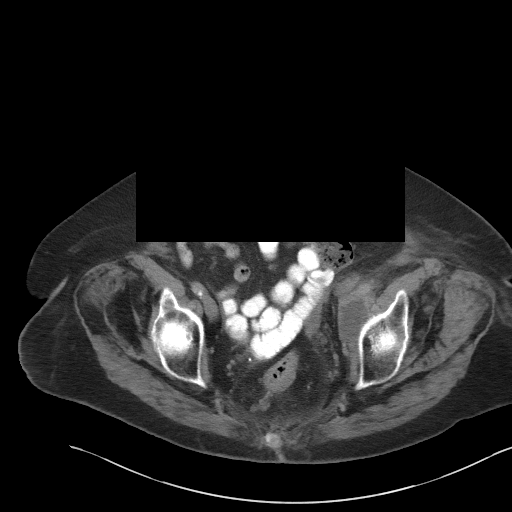
[im 28/90  soft-tissue]
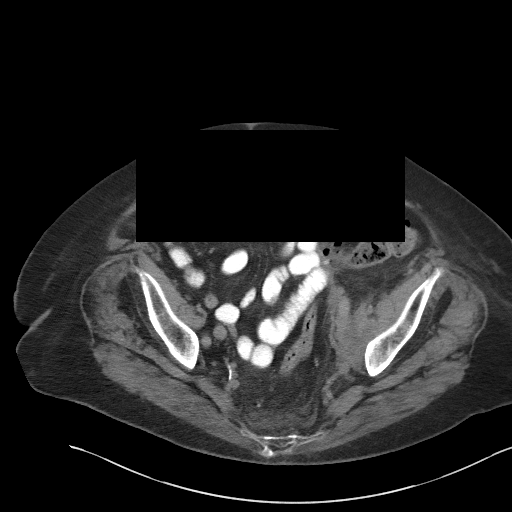
[im 34/90  soft-tissue]
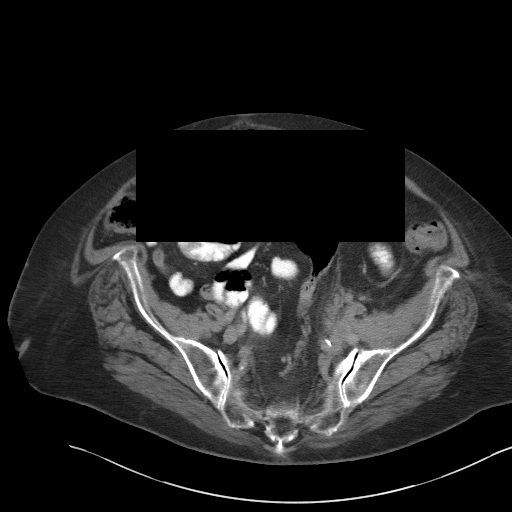
[im 39/90  soft-tissue]
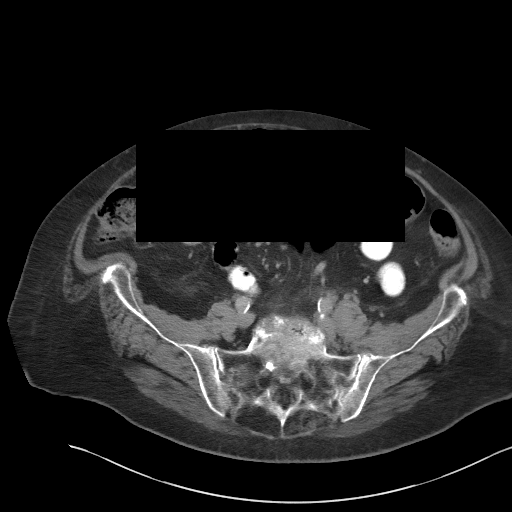
[im 51/90  soft-tissue]
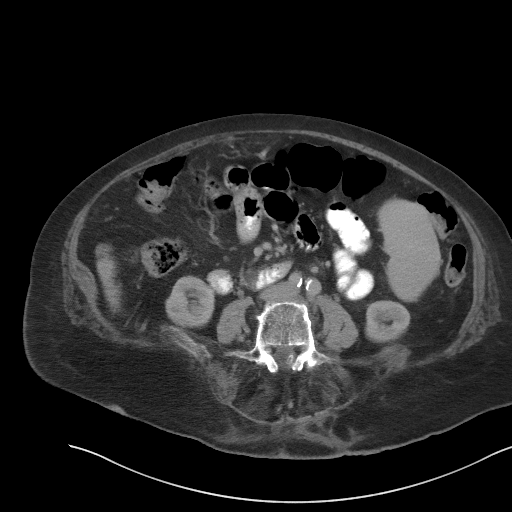
[im 56/90  soft-tissue]
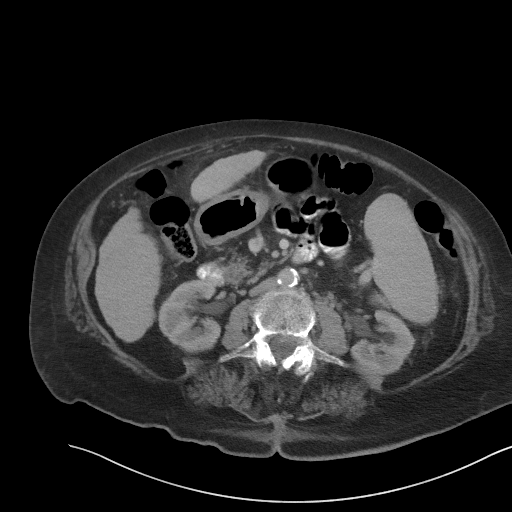
[im 62/90  soft-tissue]
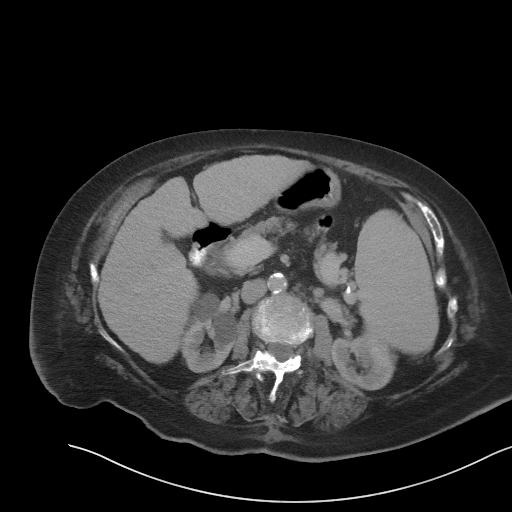
[im 62/90  bone]
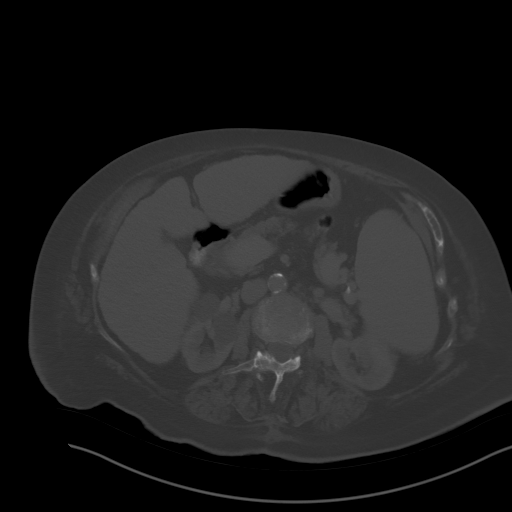
[im 67/90  soft-tissue]
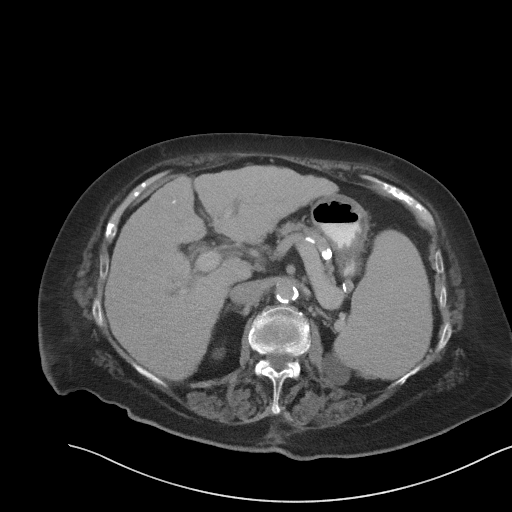
[im 78/90  soft-tissue]
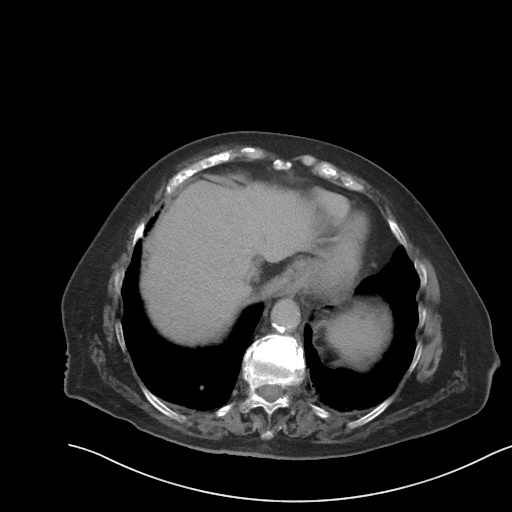
[im 84/90  soft-tissue]
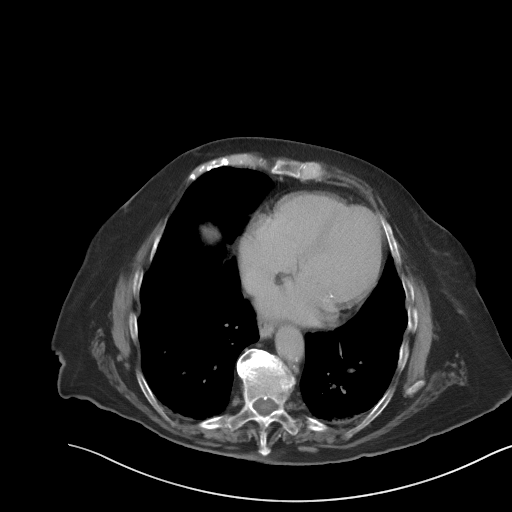

[Series 7: coronal st · coronal · 0.86mm/px · 3 of 94 slices shown]
[im 24/94  soft-tissue]
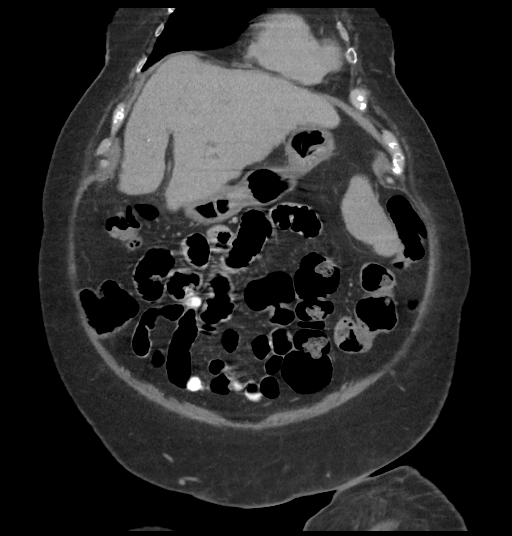
[im 47/94  soft-tissue]
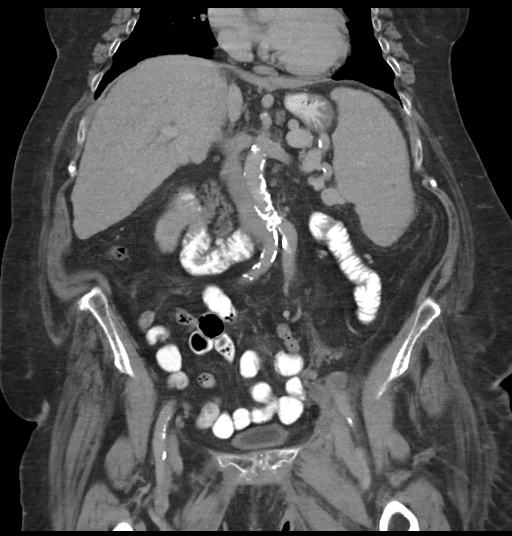
[im 70/94  soft-tissue]
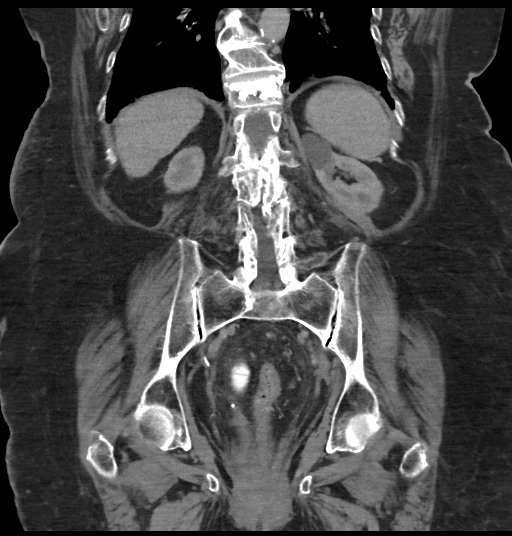

[15 of 46 positions shown; findings below may reference images not displayed]

FINDINGS: Lower chest: Bibasilar atelectasis. Question 8 mm RIGHT lower lobe
nodule image 11.

Hepatobiliary: Nodular cirrhotic appearing liver. Gallbladder
surgically absent. Minimal central biliary dilatation. No definite
hepatic mass.

Pancreas: Small cystic lesion at pancreatic head/uncinate 18 x 13 mm
image 39, new. Remainder of pancreas unremarkable.

Spleen: Splenomegaly, 14.1 x 12.5 x 7.2 cm (volume = 660 cm3). No
focal splenic lesion.

Adrenals/Urinary Tract: Adrenal glands unremarkable. BILATERAL renal
cysts. No hydronephrosis or ureteral dilatation. Foley catheter
within urinary bladder. Non thickened bladder wall may be related to
prior pelvic irradiation, chronic cystitis, or combination of both.

Stomach/Bowel: Contracted stomach. Small bowel loops unremarkable.
Rectal wall thickening which could reflect tumor and/or radiation
change. Thickening of the perirectal soft tissues into the medial
aspects of the buttock crease bilaterally. Remainder of colon
grossly unremarkable.

Vascular/Lymphatic: Scattered atherosclerotic calcifications aorta
and coronary arteries. Aorta normal caliber. Small nodes adjacent to
LEFT common iliac artery, non pathologically enlarged but new since
previous exam. Question necrotic LEFT pelvic nodes versus fluid
collections. Interval increase in size of a LEFT inguinal node now
15 mm short axis previously 7 mm. Fluid collection along LEFT pelvic
sidewall 3.3 x 5.3 cm, new. Additional suspected
low-attenuation/necrotic LEFT external iliac node 3.4 x 1.6 cm image
66. Focal fluid collection at LEFT inguinal region 4.7 x 3.1 cm
image 78 question necrotic node versus developing inflammatory
collection.

Reproductive: Uterus surgically absent. Atrophic RIGHT over and
questionable early visualized LEFT ovary.

Other: No free air free fluid. No hernia. Skin thickening along the
LEFT inguinal crease without definite abscess.

Musculoskeletal: Marked osseous demineralization. Osseous changes of
the pubic symphysis question due to prior trauma or pelvic
irradiation. Prior spinal augmentation procedure at T12. Superior
endplate compression deformity L2. Grade 1 anterolisthesis L3-L4
unchanged. Multilevel degenerative disc and facet disease changes.
IMPRESSION: Rectal wall thickening with perirectal and perianal infiltration
extending into the soft tissues along the buttock crease bilaterally
which could reflect post radiation therapy change though cannot
exclude residual rectal tumor by this exam.

New focal low-attenuation collections at the LEFT inguinal and LEFT
pelvic regions, question necrotic adenopathy new since prior study
versus developing fluid collections measuring up to 4.6 cm at LEFT
inguinal region and 5.3 cm at the LEFT internal iliac region.

Cirrhotic liver with splenomegaly.

New cystic lesion at pancreatic head/uncinate 18 x 13 mm,
significance better assessed by followup MR imaging when clinically
appropriate.

Skin thickening at inguinal crease without discrete fluid
collection.

## 2018-09-24 IMAGING — US US EXTREM LOW VENOUS*L*
1 series · 13 of 24 positions shown · non-contrast
Comparison: None.

CLINICAL DATA: 87-year-old female



[Series 1: us extrem low venous*left* · 0.08mm/px · 13 of 41 slices shown]
[im 1/41]
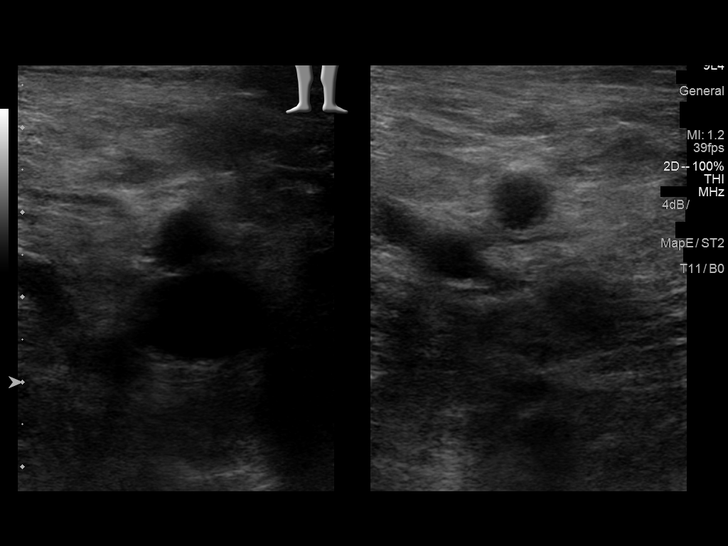
[im 4/41]
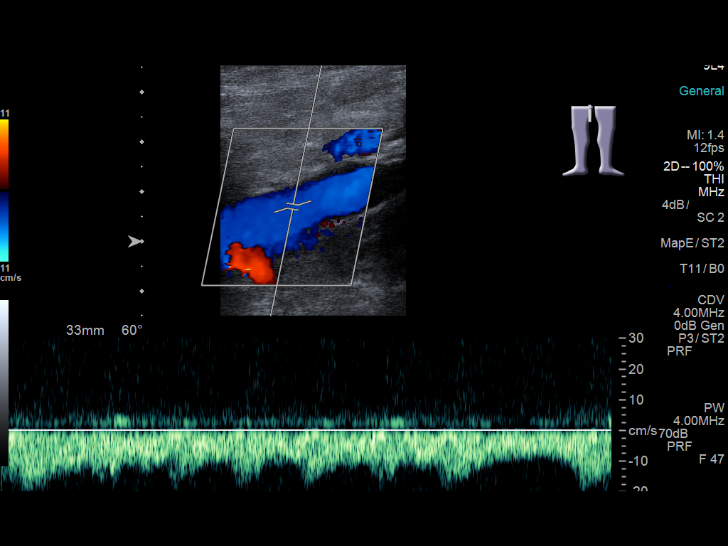
[im 7/41]
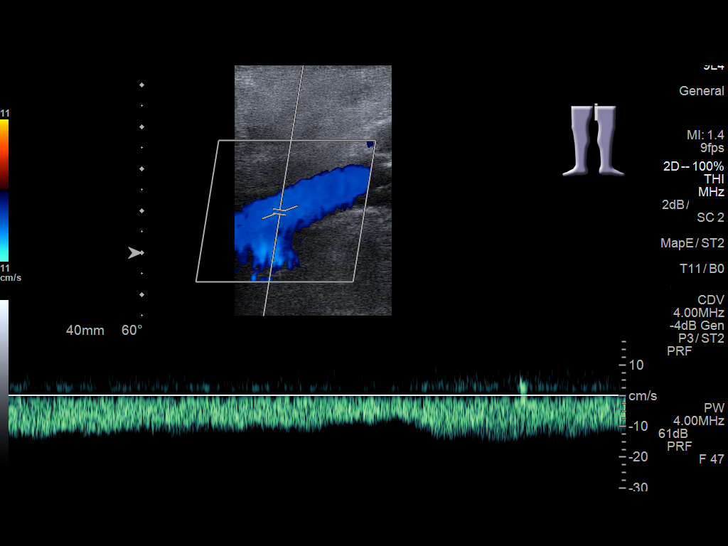
[im 11/41]
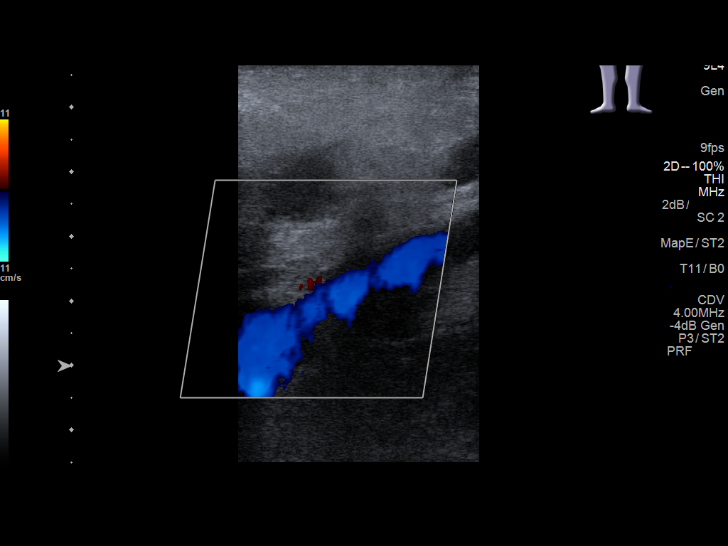
[im 14/41]
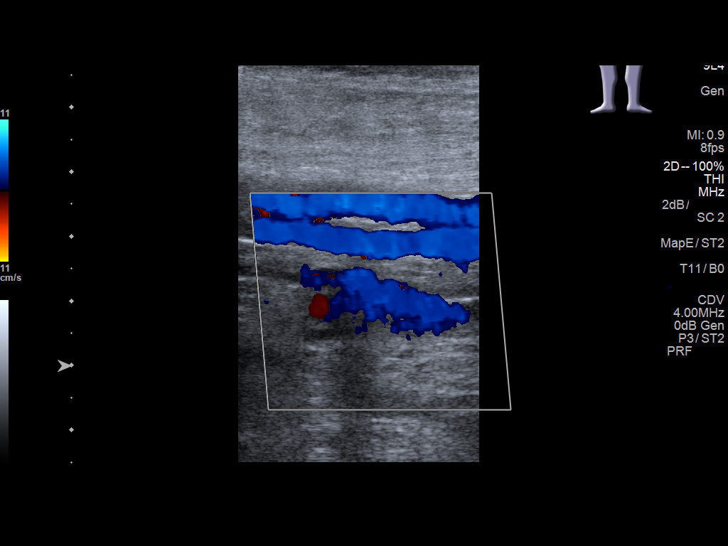
[im 18/41]
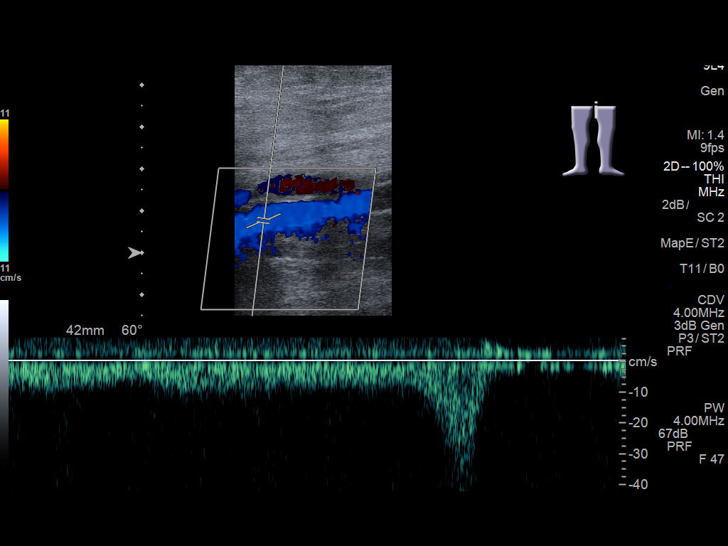
[im 21/41]
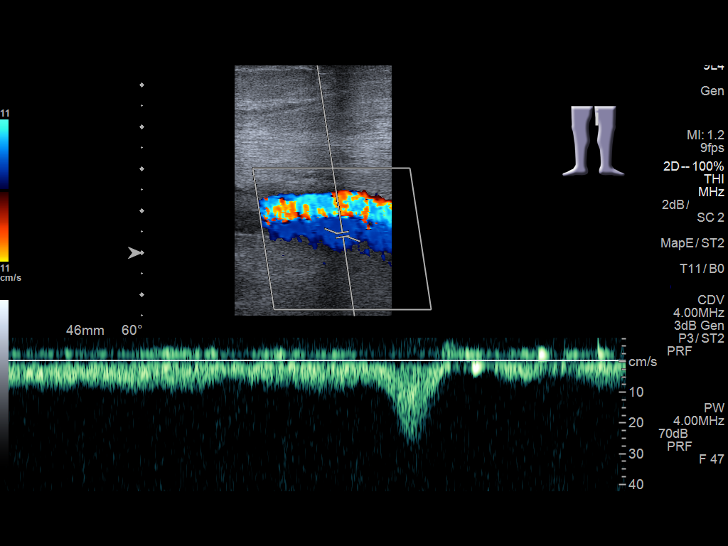
[im 23/41]
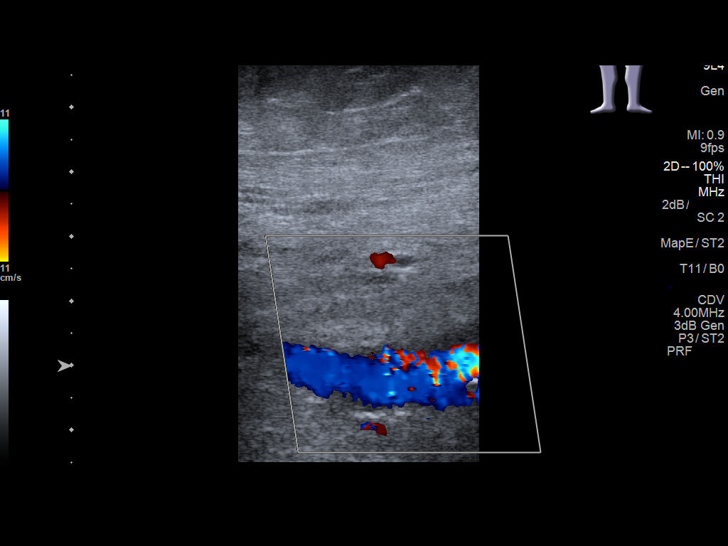
[im 27/41]
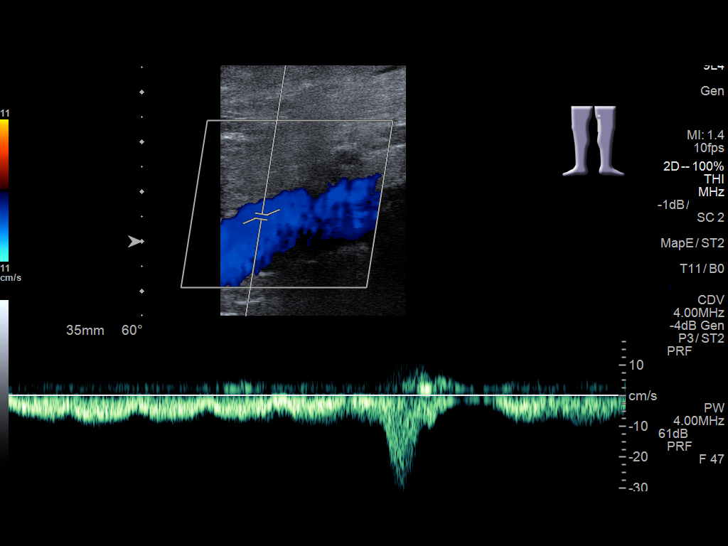
[im 30/41]
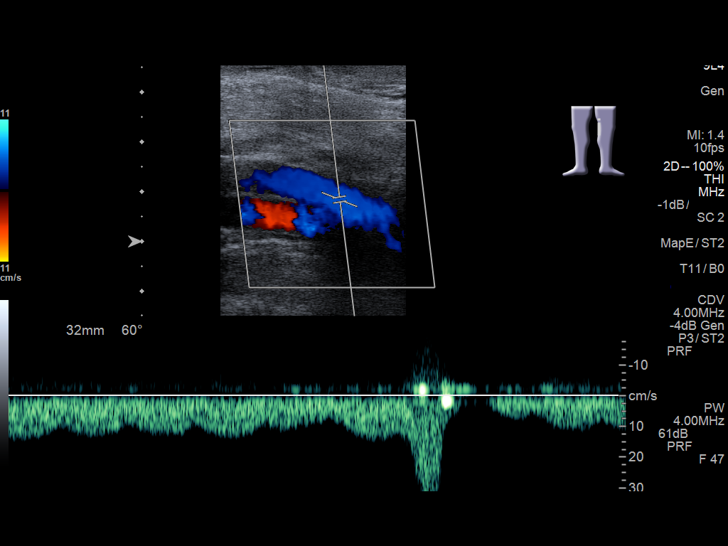
[im 34/41]
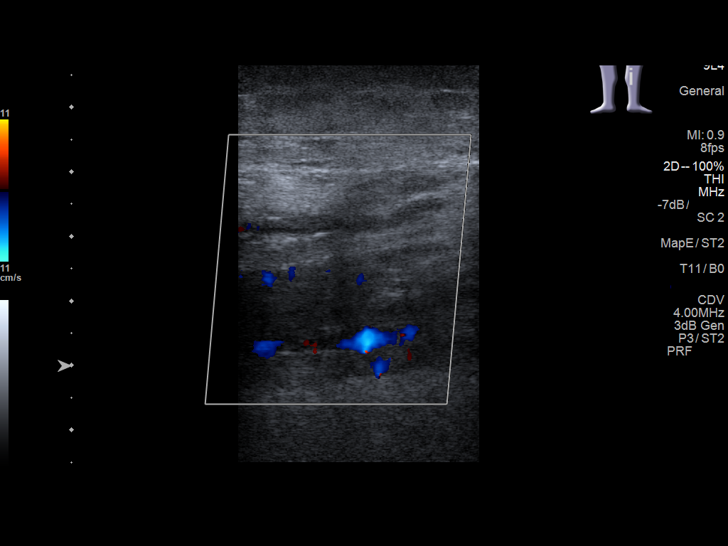
[im 37/41]
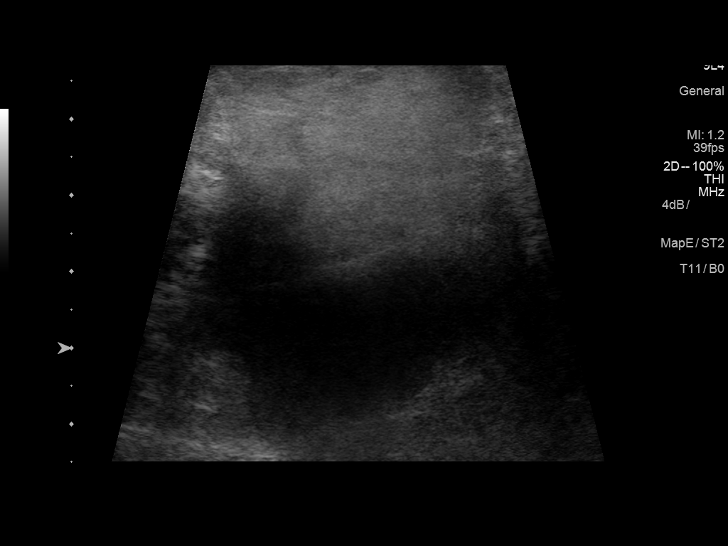
[im 41/41]
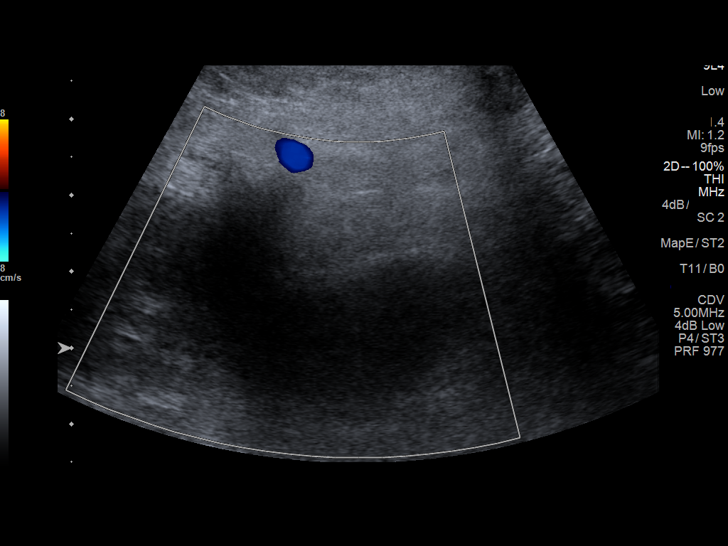

[13 of 24 positions shown; findings below may reference images not displayed]

FINDINGS: Contralateral Common Femoral Vein: Respiratory phasicity is normal
and symmetric with the symptomatic side. No evidence of thrombus.
Normal compressibility.

Common Femoral Vein: No evidence of thrombus. Normal
compressibility, respiratory phasicity and response to augmentation.

Saphenofemoral Junction: No evidence of thrombus. Normal
compressibility and flow on color Doppler imaging.

Profunda Femoral Vein: No evidence of thrombus. Normal
compressibility and flow on color Doppler imaging.

Femoral Vein: No evidence of thrombus. Normal compressibility,
respiratory phasicity and response to augmentation.

Popliteal Vein: No evidence of thrombus. Normal compressibility,
respiratory phasicity and response to augmentation.

Calf Veins: No evidence of thrombus. Normal compressibility and flow
on color Doppler imaging.

Superficial Great Saphenous Vein: No evidence of thrombus. Normal
compressibility and flow on color Doppler imaging.

Venous Reflux:  None.

Other Findings: Somewhat ill-defined hypoechoic structures are noted
in the deep superficial subcutaneous fat of the left groin just
anterior and medial to the femoral vessels. The structure is not
well seen, slightly amorphous and very deep in the tissues. The
structure Measures approximately 1.7 cm in short axis.
IMPRESSION: 1. No evidence of deep venous thrombosis.
2. In the region of clinical symptoms in the left groin, there is an
incidentally imaged hypoechoic structure adjacent to the femoral
vessels in the deep subcutaneous fat. This may represent an enlarged
deep inguinal lymph node. Please note the structure is very poorly
seen sonographically. Consider further evaluation with a CT scan of
the pelvis with intravenous contrast to rule out the possibility of
metastatic adenopathy.
